# Patient Record
Sex: Male | Born: 1965 | Race: Black or African American | Hispanic: No | Marital: Single | Smoking: Former smoker
Health system: Southern US, Community
[De-identification: ages and names within clinical notes are randomized; demographics above are authoritative.]

## PROBLEM LIST (undated history)

## (undated) DIAGNOSIS — I10 Essential (primary) hypertension: Secondary | ICD-10-CM

## (undated) DIAGNOSIS — Z9109 Other allergy status, other than to drugs and biological substances: Secondary | ICD-10-CM

## (undated) DIAGNOSIS — J40 Bronchitis, not specified as acute or chronic: Secondary | ICD-10-CM

---

## 2011-10-04 ENCOUNTER — Encounter (HOSPITAL_BASED_OUTPATIENT_CLINIC_OR_DEPARTMENT_OTHER): Payer: Self-pay

## 2011-10-04 ENCOUNTER — Emergency Department (HOSPITAL_BASED_OUTPATIENT_CLINIC_OR_DEPARTMENT_OTHER)
Admission: EM | Admit: 2011-10-04 | Discharge: 2011-10-04 | Disposition: A | Discharge status: Home / Self Care | Payer: Self-pay | Attending: Emergency Medicine | Admitting: Emergency Medicine

## 2011-10-04 DIAGNOSIS — R059 Cough, unspecified: Secondary | ICD-10-CM | POA: Insufficient documentation

## 2011-10-04 DIAGNOSIS — R093 Abnormal sputum: Secondary | ICD-10-CM | POA: Insufficient documentation

## 2011-10-04 DIAGNOSIS — J3489 Other specified disorders of nose and nasal sinuses: Secondary | ICD-10-CM | POA: Insufficient documentation

## 2011-10-04 DIAGNOSIS — R0602 Shortness of breath: Secondary | ICD-10-CM | POA: Insufficient documentation

## 2011-10-04 DIAGNOSIS — R05 Cough: Secondary | ICD-10-CM | POA: Insufficient documentation

## 2011-10-04 DIAGNOSIS — J40 Bronchitis, not specified as acute or chronic: Secondary | ICD-10-CM | POA: Insufficient documentation

## 2011-10-04 HISTORY — DX: Other allergy status, other than to drugs and biological substances: Z91.09

## 2011-10-04 HISTORY — DX: Bronchitis, not specified as acute or chronic: J40

## 2011-10-04 HISTORY — DX: Essential (primary) hypertension: I10

## 2011-10-04 MED ORDER — IPRATROPIUM BROMIDE 0.02 % IN SOLN
0.5000 mg | Freq: Once | RESPIRATORY_TRACT | Status: AC
  Administered 2011-10-04: 0.5 mg via RESPIRATORY_TRACT

## 2011-10-04 MED ORDER — ALBUTEROL SULFATE (5 MG/ML) 0.5% IN NEBU
5.0000 mg | INHALATION_SOLUTION | Freq: Once | RESPIRATORY_TRACT | Status: AC
  Administered 2011-10-04: 5 mg via RESPIRATORY_TRACT
  Filled 2011-10-04: qty 1

## 2011-10-04 MED ORDER — IPRATROPIUM BROMIDE 0.02 % IN SOLN
0.5000 mg | Freq: Once | RESPIRATORY_TRACT | Status: AC
  Administered 2011-10-04: 0.5 mg via RESPIRATORY_TRACT
  Filled 2011-10-04: qty 2.5

## 2011-10-04 MED ORDER — PREDNISONE 50 MG PO TABS
60.0000 mg | ORAL_TABLET | Freq: Once | ORAL | Status: AC
  Administered 2011-10-04: 60 mg via ORAL
  Filled 2011-10-04: qty 1

## 2011-10-04 MED ORDER — ALBUTEROL SULFATE (5 MG/ML) 0.5% IN NEBU
INHALATION_SOLUTION | RESPIRATORY_TRACT | Status: AC
  Filled 2011-10-04: qty 1

## 2011-10-04 MED ORDER — ALBUTEROL SULFATE HFA 108 (90 BASE) MCG/ACT IN AERS
1.0000 | INHALATION_SPRAY | Freq: Four times a day (QID) | RESPIRATORY_TRACT | Status: DC
  Administered 2011-10-04: 1 via RESPIRATORY_TRACT
  Filled 2011-10-04: qty 6.7

## 2011-10-04 MED ORDER — IPRATROPIUM BROMIDE 0.02 % IN SOLN
RESPIRATORY_TRACT | Status: AC
  Filled 2011-10-04: qty 2.5

## 2011-10-04 MED ORDER — ALBUTEROL SULFATE (5 MG/ML) 0.5% IN NEBU
5.0000 mg | INHALATION_SOLUTION | Freq: Once | RESPIRATORY_TRACT | Status: AC
  Administered 2011-10-04: 5 mg via RESPIRATORY_TRACT

## 2011-10-04 MED ORDER — HYDROCOD POLST-CHLORPHEN POLST 10-8 MG/5ML PO LQCR: 5.0000 mL | Freq: Every evening | ORAL | Status: DC | PRN

## 2011-10-04 MED ORDER — PREDNISONE 10 MG PO TABS: 10.0000 mg | ORAL_TABLET | Freq: Two times a day (BID) | ORAL | Status: DC

## 2011-10-04 NOTE — ED Notes (Signed)
Pt states that for the past 3-4 days he has had a cough with chest congestion, denies head congestion, c/o seasonal allergies.

## 2011-10-04 NOTE — Discharge Instructions (Signed)
Use albuterol inhaler, 2 puffs every 4 hours, as needed for cough and shortness of breath.  Take prednisone as prescribed.   Take tussionex at night for cough if you are unable to sleep.  Do not drive within four hours of taking this medication (may cause drowsiness or confusion).  Call [facility] [phone]) if you do not have a primary care doctor and would like assistance with finding one.   You should return to the ER if you develop fever (temp >100.5), chest pain or worsening shortness of breath.  Bronchitis Bronchitis is the body's way of reacting to injury and/or infection (inflammation) of the bronchi. Bronchi are the air tubes that extend from the windpipe into the lungs. If the inflammation becomes severe, it may cause shortness of breath. CAUSES  Inflammation may be caused by:  A virus.   Germs (bacteria).   Dust.   Allergens.   Pollutants and many other irritants.  The cells lining the bronchial tree are covered with tiny hairs (cilia). These constantly beat upward, away from the lungs, toward the mouth. This keeps the lungs free of pollutants. When these cells become too irritated and are unable to do their job, mucus begins to develop. This causes the characteristic cough of bronchitis. The cough clears the lungs when the cilia are unable to do their job. Without either of these protective mechanisms, the mucus would settle in the lungs. Then you would develop pneumonia. Smoking is a common cause of bronchitis and can contribute to pneumonia. Stopping this habit is the single most important thing you can do to help yourself. TREATMENT   Your caregiver may prescribe an antibiotic if the cough is caused by bacteria. Also, medicines that open up your airways make it easier to breathe. Your caregiver may also recommend or prescribe an expectorant. It will loosen the mucus to be coughed up. Only take over-the-counter or prescription medicines for pain, discomfort, or fever as directed  by your caregiver.   Removing whatever causes the problem (smoking, for example) is critical to preventing the problem from getting worse.   Cough suppressants may be prescribed for relief of cough symptoms.   Inhaled medicines may be prescribed to help with symptoms now and to help prevent problems from returning.   For those with recurrent (chronic) bronchitis, there may be a need for steroid medicines.  SEEK IMMEDIATE MEDICAL CARE IF:   During treatment, you develop more pus-like mucus (purulent sputum).   You have a fever.   Your baby is older than 3 months with a rectal temperature of 102 F (38.9 C) or higher.   Your baby is 21 months old or younger with a rectal temperature of 100.4 F (38 C) or higher.   You become progressively more ill.   You have increased difficulty breathing, wheezing, or shortness of breath.  It is necessary to seek immediate medical care if you are elderly or sick from any other disease. MAKE SURE YOU:   Understand these instructions.   Will watch your condition.   Will get help right away if you are not doing well or get worse.  Document Released: 05/18/2005 Document Revised: 05/07/2011 Document Reviewed: 03/27/2008 [facility] Patient Information 2012 [place], [place].

## 2011-10-04 NOTE — ED Provider Notes (Signed)
History     CSN: [id number]  Arrival date & time 10/04/11  1220   First MD Initiated Contact with Patient 10/04/11 1239      Chief Complaint  Patient presents with  . Cough    (Consider location/radiation/quality/duration/timing/severity/associated sxs/prior treatment) HPI History provided by pt.   Pt presents w/ cough productive of white sputum x approx 3 days.  Associated w/ wheezing and mild, exertional SOB, as well as nasal congestion and rhinorrhea.  Denies fever, CP, N/V/D.  Wheezing and cough aggravated by laying flat.  No known sick contacts.  No h/o pulmonary disease but has seasonal allergies.  Quit smoking 3 years ago.    Past Medical History  Diagnosis Date  . Bronchitis   . Environmental allergies   . Hypertension     History reviewed. No pertinent past surgical history.  History reviewed. No pertinent family history.  History  Substance Use Topics  . Smoking status: Never Smoker   . Smokeless tobacco: Never Used  . Alcohol Use: No      Review of Systems  All other systems reviewed and are negative.    Allergies  Review of patient's allergies indicates no known allergies.  Home Medications   Current Outpatient Rx  Name Route Sig Dispense Refill  . ATENOLOL PO Oral Take by mouth daily.    Marland Kitchen UNKNOWN TO PATIENT  Blood pressure medication taken by mouth once daily, name starts with "L"      BP 157/94  Pulse 81  Temp(Src) 98.1 F (36.7 C) (Oral)  Resp 20  Ht 6' (1.829 m)  Wt 190 lb (86.183 kg)  BMI 25.77 kg/m2  SpO2 100%  Physical Exam  Nursing note and vitals reviewed. Constitutional: He is oriented to person, place, and time. He appears well-developed and well-nourished. No distress.  HENT:  Head: Normocephalic and atraumatic.       No sinus ttp.  Nasal congestion.   Eyes:       Normal appearance  Neck: Normal range of motion.  Cardiovascular: Normal rate and regular rhythm.   Pulmonary/Chest: Effort normal and breath sounds normal.  No respiratory distress.       Diffuse inspiratory wheezing and expiratory rhonchi; more prominent in upper lung fields.  Chest non-tender.   Musculoskeletal: Normal range of motion.       No peripheral edema or calf ttp  Lymphadenopathy:    He has no cervical adenopathy.  Neurological: He is alert and oriented to person, place, and time.  Skin: Skin is warm and dry. No rash noted.  Psychiatric: He has a normal mood and affect. His behavior is normal.    ED Course  Procedures (including critical care time)  Labs Reviewed - No data to display No results found.   1. Bronchitis       MDM  Healthy 46yo M presents w/ productive cough, wheezing and mild, exertional SOB.  Doubt pneumonia based on duration of sx and exam.  Likely has a viral bronchitis.  RT examined initially and reported diffuse insp/exp wheezing.  She treated w/ albuterol/atrovent neb, re-examined and reported that wheezing improved.  Pt is feeling a little better.  On my exam, diffuse inspiratory wheezing and expiratory rhonchi; no coughing.  Will try a second treatment + 60mg  prednisone and reassess shortly.  1:22 PM   Pt continues to feel better.  On re-examination, wheezing present but much improved.  Pt received an albuterol inhaler an d/c'd home w/ prednisone and short course of tussionex.  He understands that his cough may persist for 1-2 weeks and that abx are unnecessary at this time.  Return precautions discussed. 2:01 PM       Otilio Miu, PA 10/04/11 1402  Otilio Miu, PA 10/05/11 1436

## 2011-10-05 NOTE — ED Provider Notes (Signed)
Medical screening examination/treatment/procedure(s) were performed by non-physician practitioner and as supervising physician I was immediately available for consultation/collaboration.   Gwyneth Sprout, MD 10/05/11 2134

## 2012-02-29 ENCOUNTER — Telehealth: Payer: Self-pay | Admitting: Physician Assistant

## 2012-02-29 ENCOUNTER — Inpatient Hospital Stay (HOSPITAL_BASED_OUTPATIENT_CLINIC_OR_DEPARTMENT_OTHER)
Admission: EM | Admit: 2012-02-29 | Discharge: 2012-03-02 | DRG: 203 | Disposition: A | Discharge status: Home / Self Care | Payer: Self-pay | Attending: Internal Medicine | Admitting: Internal Medicine

## 2012-02-29 ENCOUNTER — Emergency Department (HOSPITAL_BASED_OUTPATIENT_CLINIC_OR_DEPARTMENT_OTHER): Payer: Self-pay

## 2012-02-29 ENCOUNTER — Encounter (HOSPITAL_BASED_OUTPATIENT_CLINIC_OR_DEPARTMENT_OTHER): Payer: Self-pay | Admitting: Family Medicine

## 2012-02-29 DIAGNOSIS — J309 Allergic rhinitis, unspecified: Secondary | ICD-10-CM | POA: Diagnosis present

## 2012-02-29 DIAGNOSIS — R0789 Other chest pain: Secondary | ICD-10-CM | POA: Diagnosis present

## 2012-02-29 DIAGNOSIS — R0600 Dyspnea, unspecified: Secondary | ICD-10-CM | POA: Diagnosis present

## 2012-02-29 DIAGNOSIS — J209 Acute bronchitis, unspecified: Secondary | ICD-10-CM | POA: Diagnosis present

## 2012-02-29 DIAGNOSIS — Z87891 Personal history of nicotine dependence: Secondary | ICD-10-CM

## 2012-02-29 DIAGNOSIS — R062 Wheezing: Secondary | ICD-10-CM

## 2012-02-29 DIAGNOSIS — I10 Essential (primary) hypertension: Secondary | ICD-10-CM | POA: Diagnosis present

## 2012-02-29 DIAGNOSIS — R0602 Shortness of breath: Secondary | ICD-10-CM

## 2012-02-29 DIAGNOSIS — I160 Hypertensive urgency: Secondary | ICD-10-CM

## 2012-02-29 DIAGNOSIS — N183 Chronic kidney disease, stage 3 unspecified: Secondary | ICD-10-CM | POA: Diagnosis present

## 2012-02-29 DIAGNOSIS — J45901 Unspecified asthma with (acute) exacerbation: Principal | ICD-10-CM | POA: Diagnosis present

## 2012-02-29 LAB — CBC
MCH: 30.6 pg (ref 26.0–34.0)
MCHC: 35.3 g/dL (ref 30.0–36.0)
MCV: 86.7 fL (ref 78.0–100.0)
Platelets: 267 10*3/uL (ref 150–400)
RBC: 5.03 MIL/uL (ref 4.22–5.81)
RDW: 12.6 % (ref 11.5–15.5)

## 2012-02-29 LAB — BASIC METABOLIC PANEL
CO2: 25 mEq/L (ref 19–32)
Calcium: 9.6 mg/dL (ref 8.4–10.5)
Creatinine, Ser: 1.5 mg/dL — ABNORMAL HIGH (ref 0.50–1.35)
GFR calc Af Amer: 64 mL/min — ABNORMAL LOW (ref >90)
GFR calc non Af Amer: 55 mL/min — ABNORMAL LOW (ref >90)
Sodium: 136 mEq/L (ref 135–145)

## 2012-02-29 LAB — CBC WITH DIFFERENTIAL/PLATELET
Basophils Absolute: 0 10*3/uL (ref 0.0–0.1)
Basophils Relative: 1 % (ref 0–1)
Eosinophils Absolute: 0.5 10*3/uL (ref 0.0–0.7)
Eosinophils Relative: 9 % — ABNORMAL HIGH (ref 0–5)
Lymphocytes Relative: 48 % — ABNORMAL HIGH (ref 12–46)
MCHC: 35.7 g/dL (ref 30.0–36.0)
MCV: 85.1 fL (ref 78.0–100.0)
Monocytes Absolute: 0.5 10*3/uL (ref 0.1–1.0)
Platelets: 246 10*3/uL (ref 150–400)
RDW: 12.1 % (ref 11.5–15.5)
WBC: 5.4 10*3/uL (ref 4.0–10.5)

## 2012-02-29 LAB — MRSA PCR SCREENING: MRSA by PCR: NEGATIVE

## 2012-02-29 LAB — CREATININE, SERUM: Creatinine, Ser: 1.47 mg/dL — ABNORMAL HIGH (ref 0.50–1.35)

## 2012-02-29 MED ORDER — IPRATROPIUM BROMIDE 0.02 % IN SOLN: 0.5000 mg | RESPIRATORY_TRACT | Status: DC | PRN

## 2012-02-29 MED ORDER — MORPHINE SULFATE 2 MG/ML IJ SOLN: 2.0000 mg | INTRAMUSCULAR | Status: DC | PRN

## 2012-02-29 MED ORDER — HEPARIN SODIUM (PORCINE) 5000 UNIT/ML IJ SOLN
5000.0000 [IU] | Freq: Three times a day (TID) | INTRAMUSCULAR | Status: DC
  Administered 2012-02-29 – 2012-03-02 (×5): 5000 [IU] via SUBCUTANEOUS
  Filled 2012-02-29 (×8): qty 1

## 2012-02-29 MED ORDER — ONDANSETRON HCL 4 MG/2ML IJ SOLN: 4.0000 mg | Freq: Three times a day (TID) | INTRAMUSCULAR | Status: DC | PRN

## 2012-02-29 MED ORDER — IPRATROPIUM BROMIDE 0.02 % IN SOLN
0.5000 mg | RESPIRATORY_TRACT | Status: DC
  Administered 2012-02-29: 0.5 mg via RESPIRATORY_TRACT
  Filled 2012-02-29: qty 2.5

## 2012-02-29 MED ORDER — MULTI-VITAMIN/MINERALS PO TABS: 1.0000 | ORAL_TABLET | Freq: Every day | ORAL | Status: DC

## 2012-02-29 MED ORDER — ALBUTEROL SULFATE (5 MG/ML) 0.5% IN NEBU
2.5000 mg | INHALATION_SOLUTION | Freq: Once | RESPIRATORY_TRACT | Status: AC
  Administered 2012-02-29: 2.5 mg via RESPIRATORY_TRACT
  Filled 2012-02-29: qty 0.5

## 2012-02-29 MED ORDER — OMEGA-3-ACID ETHYL ESTERS 1 G PO CAPS
1.0000 g | ORAL_CAPSULE | Freq: Every day | ORAL | Status: DC
  Administered 2012-02-29 – 2012-03-02 (×3): 1 g via ORAL
  Filled 2012-02-29 (×3): qty 1

## 2012-02-29 MED ORDER — ADULT MULTIVITAMIN W/MINERALS CH
1.0000 | ORAL_TABLET | Freq: Every day | ORAL | Status: DC
  Administered 2012-02-29 – 2012-03-02 (×3): 1 via ORAL
  Filled 2012-02-29 (×3): qty 1

## 2012-02-29 MED ORDER — PREDNISONE 10 MG PO TABS
ORAL_TABLET | ORAL | Status: AC
  Filled 2012-02-29: qty 1

## 2012-02-29 MED ORDER — ALBUTEROL SULFATE (5 MG/ML) 0.5% IN NEBU
2.5000 mg | INHALATION_SOLUTION | RESPIRATORY_TRACT | Status: DC
  Administered 2012-02-29: 2.5 mg via RESPIRATORY_TRACT
  Filled 2012-02-29: qty 0.5

## 2012-02-29 MED ORDER — SODIUM CHLORIDE 0.9 % IJ SOLN
3.0000 mL | Freq: Two times a day (BID) | INTRAMUSCULAR | Status: DC
  Administered 2012-02-29 – 2012-03-02 (×4): 3 mL via INTRAVENOUS

## 2012-02-29 MED ORDER — METHYLPREDNISOLONE SODIUM SUCC 125 MG IJ SOLR
60.0000 mg | Freq: Four times a day (QID) | INTRAMUSCULAR | Status: AC
Start: 2012-02-29 — End: 2012-03-01
  Administered 2012-02-29: 18:00:00 via INTRAVENOUS
  Administered 2012-03-01 (×4): 60 mg via INTRAVENOUS
  Filled 2012-02-29 (×7): qty 0.96

## 2012-02-29 MED ORDER — SODIUM CHLORIDE 0.9 % IJ SOLN: 3.0000 mL | INTRAMUSCULAR | Status: DC | PRN

## 2012-02-29 MED ORDER — LISINOPRIL 20 MG PO TABS
20.0000 mg | ORAL_TABLET | Freq: Every day | ORAL | Status: DC
  Administered 2012-02-29 – 2012-03-02 (×3): 20 mg via ORAL
  Filled 2012-02-29 (×3): qty 1

## 2012-02-29 MED ORDER — SODIUM CHLORIDE 0.9 % IV SOLN: INTRAVENOUS | Status: DC

## 2012-02-29 MED ORDER — LABETALOL HCL 5 MG/ML IV SOLN: 20.0000 mg | INTRAVENOUS | Status: DC | PRN

## 2012-02-29 MED ORDER — IPRATROPIUM BROMIDE 0.02 % IN SOLN
0.5000 mg | Freq: Once | RESPIRATORY_TRACT | Status: AC
  Administered 2012-02-29: 0.5 mg via RESPIRATORY_TRACT
  Filled 2012-02-29: qty 2.5

## 2012-02-29 MED ORDER — AMLODIPINE BESYLATE 10 MG PO TABS
10.0000 mg | ORAL_TABLET | Freq: Every day | ORAL | Status: DC
  Administered 2012-02-29 – 2012-03-02 (×3): 10 mg via ORAL
  Filled 2012-02-29 (×3): qty 1

## 2012-02-29 MED ORDER — OMEGA-3 FATTY ACIDS 1000 MG PO CAPS: 1.0000 g | ORAL_CAPSULE | Freq: Every day | ORAL | Status: DC

## 2012-02-29 MED ORDER — ALBUTEROL SULFATE (5 MG/ML) 0.5% IN NEBU: 2.5000 mg | INHALATION_SOLUTION | RESPIRATORY_TRACT | Status: DC | PRN

## 2012-02-29 MED ORDER — MOXIFLOXACIN HCL 400 MG PO TABS
400.0000 mg | ORAL_TABLET | Freq: Every day | ORAL | Status: DC
  Administered 2012-02-29 – 2012-03-01 (×2): 400 mg via ORAL
  Filled 2012-02-29 (×3): qty 1

## 2012-02-29 MED ORDER — ACETAMINOPHEN 650 MG RE SUPP: 650.0000 mg | Freq: Four times a day (QID) | RECTAL | Status: DC | PRN

## 2012-02-29 MED ORDER — SODIUM CHLORIDE 0.9 % IV SOLN: 250.0000 mL | INTRAVENOUS | Status: DC | PRN

## 2012-02-29 MED ORDER — PREDNISONE 50 MG PO TABS
ORAL_TABLET | ORAL | Status: AC
  Filled 2012-02-29: qty 1

## 2012-02-29 MED ORDER — LABETALOL HCL 5 MG/ML IV SOLN
10.0000 mg | Freq: Once | INTRAVENOUS | Status: AC
  Administered 2012-02-29: 10 mg via INTRAVENOUS
  Filled 2012-02-29: qty 4

## 2012-02-29 MED ORDER — OXYCODONE HCL 5 MG PO TABS: 5.0000 mg | ORAL_TABLET | ORAL | Status: DC | PRN

## 2012-02-29 MED ORDER — HYDRALAZINE HCL 20 MG/ML IJ SOLN
10.0000 mg | Freq: Once | INTRAMUSCULAR | Status: AC
  Administered 2012-02-29: 10 mg via INTRAVENOUS
  Filled 2012-02-29: qty 1

## 2012-02-29 MED ORDER — ACETAMINOPHEN 325 MG PO TABS: 650.0000 mg | ORAL_TABLET | Freq: Four times a day (QID) | ORAL | Status: DC | PRN

## 2012-02-29 MED ORDER — PREDNISONE 50 MG PO TABS
60.0000 mg | ORAL_TABLET | Freq: Every day | ORAL | Status: DC
  Administered 2012-02-29: 60 mg via ORAL
  Filled 2012-02-29: qty 1

## 2012-02-29 NOTE — Telephone Encounter (Signed)
Telephone Call with Dr. Alto Howard at [facility]  Disposition Note  Dennis Howard, is a 46 y.o. male,   MRN: [id number]  -  DOB - 07-22-1965  Outpatient Primary MD for the patient is No primary provider on file.   46 yo male presents with shortness of breath x 3 - 4 weeks.  He has a history of reactive airway disease.  BP is found to be significantly elevated 188/127.  Labs were not remarkable with the exception of a creatinine of 1.5.  Patient was treated with nebulizers and SOB improved.  He does not complain of chest pain.  He intermittently takes his father's BP medicine (Atenolol).  He saw Dr Dennis Howard once several years ago but does not plan to return to see him again.    EDP is giving patient a dose of Labetalol to attempt to bring BP down.  He will likely need BP med infusion.  Step down bed has been requested for hypertensive urgency.  Hopefully when he arrives from [facility] this will be able to be down graded to a tele bed.   Dennis Downs, PA-C [facility] Pager: [phone]

## 2012-02-29 NOTE — ED Provider Notes (Signed)
History     CSN: [id number]  Arrival date & time 02/29/12  1006   First MD Initiated Contact with Patient 02/29/12 1055      Chief Complaint  Patient presents with  . Shortness of Breath    (Consider location/radiation/quality/duration/timing/severity/associated sxs/prior treatment) HPI Patient is a 46 year old male with history of hypertension who presents today complaining of 3-4 weeks of shortness of breath. He feels that this has been slightly progressive. Patient reports dyspnea on exertion. He denies any leg swelling or chest pain. He endorses cough but denies any fevers or sick contacts. He has no history of diabetes, hypercholesterolemia, or early CAD in his family. Presenting blood pressures are in the 190s over 130s. Patient is otherwise hemodynamically stable. He denies any numbness, tingling, or paresthesias. Patient denies headache or lightheadedness. He is on any history of kidney disease to his knowledge. He initially reports that he has not been taking his atenolol as prescribed. On further discussion it appears that the patient had seen Dr. Roselee Nova he at his father's recommendations several years ago. Since that time patient has been intermittently taking his father's atenolol. There are no other associated or modifying factors. Past Medical History  Diagnosis Date  . Bronchitis   . Environmental allergies   . Hypertension     History reviewed. No pertinent past surgical history.  History reviewed. No pertinent family history.  History  Substance Use Topics  . Smoking status: Never Smoker   . Smokeless tobacco: Never Used  . Alcohol Use: Yes     occ      Review of Systems  Constitutional: Positive for fatigue.  HENT: Negative.   Eyes: Negative.   Respiratory: Positive for cough and shortness of breath.   Cardiovascular: Negative.   Gastrointestinal: Negative.   Genitourinary: Negative.   Musculoskeletal: Negative.   Skin: Negative.   Neurological: Negative.    Hematological: Negative.   Psychiatric/Behavioral: Negative.   All other systems reviewed and are negative.    Allergies  Review of patient's allergies indicates no known allergies.  Home Medications  No current outpatient prescriptions on file.  BP 181/118  Pulse 86  Temp 97.9 F (36.6 C) (Oral)  Resp 20  Ht 6' (1.829 m)  Wt 195 lb (88.451 kg)  BMI 26.45 kg/m2  SpO2 97%  Physical Exam  Nursing note and vitals reviewed. GEN: Well-developed, well-nourished male in no distress HEENT: Atraumatic, normocephalic. Oropharynx clear without erythema EYES: PERRLA BL, no scleral icterus. NECK: Trachea midline, no meningismus CV: regular rate and rhythm. No murmurs, rubs, or gallops PULM: Diminished throughout. Occasional expiratory wheeze. No crackles or rales appreciated.  GI: soft, non-tender. No guarding, rebound, or tenderness. + bowel sounds  GU: deferred Neuro: cranial nerves grossly 2-12 intact, no abnormalities of strength or sensation, A and O x 3 MSK: Patient moves all 4 extremities symmetrically, no deformity, edema, or injury noted Skin: No rashes petechiae, purpura, or jaundice Psych: no abnormality of mood   ED Course  Procedures (including critical care time)  Indication: HTN Please note this EKG was reviewed extemporaneously by myself.   Date: 02/29/2012  Rate: 87  Rhythm: normal sinus rhythm  QRS Axis: normal  Intervals: normal  ST/T Wave abnormalities: normal  Conduction Disutrbances:none  Narrative Interpretation:   Old EKG Reviewed: none available      Labs Reviewed  CBC WITH DIFFERENTIAL - Abnormal; Notable for the following:    Neutrophils Relative 33 (*)     Lymphocytes Relative 48 (*)  Eosinophils Relative 9 (*)     All other components within normal limits  BASIC METABOLIC PANEL - Abnormal; Notable for the following:    Creatinine, Ser 1.50 (*)     GFR calc non Af Amer 55 (*)     GFR calc Af Amer 64 (*)     All other components  within normal limits  MRSA PCR SCREENING   Dg Chest 2 View  02/29/2012  *RADIOLOGY REPORT*  Clinical Data: 46 year old male with chest pain and tightness shortness of breath.  CHEST - 2 VIEW  Comparison: None.  Findings: Normal lung volumes.  Cardiac size and mediastinal contours are within normal limits.  Visualized tracheal air column is within normal limits.  No pneumothorax, pulmonary edema, pleural effusion or confluent pulmonary opacity. No acute osseous abnormality identified.  IMPRESSION: No acute cardiopulmonary abnormality.   Original Report Authenticated By: Harley Hallmark, M.D.      1. Hypertensive urgency   2. Shortness of breath   3. Wheezing       MDM  Patient was evaluated by myself. Based on evaluation patient did have some wheezing. He was treated with prednisone as well as albuterol and Atrovent. Patient had very elevated blood pressure. He denied any chest pain or neurologic symptoms. Patient reported missing a few days a blood pressure medication but in fact had been sharing a prescription with his father and had not been taking medication for some time. Patient was given a dose of labetalol 10 mg IV. He did not have significant improvement in his blood pressure with this. Patient did have creatinine of 1.5 today with normal potassium. I did not have any labs for comparison. Though this could represent acute renal insufficiency patient does not have any hyperkalemia accompanying this. Patient was given a dose of hydralazine 10 mg IV. He was transferred to [facility] for mesh in a step down unit for management of hypertensive urgency as well as his shortness of breath. Patient absolutely no neurologic symptoms or chest pain to suggest this being an episode of hypertensive emergency with either ACS or neurovascular compromise.        Cyndra Numbers, MD 02/29/12 [phone]

## 2012-02-29 NOTE — ED Notes (Signed)
Pt states he and his father have been sharing his fathers BP medication.

## 2012-02-29 NOTE — H&P (Signed)
PCP:   No primary provider on file.   Chief Complaint:  Shortness of breath/wheeze. Marland Kitchen  HPI: This is a 46 year old male, with known history of HTN and allergic rhinitis, who presented to [facility] with progressive shortness of breath, chest tightness and wheeze. According to patient, he has a history of allergic rhinitis, which is perennial, but lately, has improved, which he insists is due to taking fish oil on  Regular basis. His main problem otherwise, was HTN, which was diagnosed about 8 yeers ago. He followed up with Dr Rinaldo Cloud between 2005-2008, and was treated with Atenolol and Lisinopril. Since 2008, when he was out of a job, he was unable to obtain antihypertensives, and has resorted to sharing his father's Atenolol and Lisinopril, albeit, not very regularly. Im May 2013, he had an episode of chest tightness and wheeze, for which he was treated at the [facility] with nebulizers, and then discharged on Albuterol inhaler and a 7-day course of Prednisone, with resolution. Since then he has been asymptomatic until end of August to beginning of September this year, when he once again, stated having episodes of chest tightness, wheeze and dyspnea, which have become progressive, for a mionth. In the last one week, symptoms have worsened and he has a had a dry cough, which became productive of clear phlegm from 02/26/12. He denies associated fever, chills or chest pain. In the last 2 days, he has been unable to slesp well because of his symptoms, and was awake all night on 02/28/12, due to wheeze. In AM of 02/29/12, he went to [facility], was given 2 nebulizer treatments with amelioration of symptoms, and then transferred to [facility] SDU. Marland Kitchen   Allergies:  No Known Allergies    Past Medical History  Diagnosis Date  . Bronchitis   . Environmental allergies   . Hypertension     History reviewed. No pertinent past surgical history.  Prior to Admission medications   Medication Sig Start  Date End Date Taking? Authorizing Provider  fish oil-omega-3 fatty acids 1000 MG capsule Take by mouth daily.   Yes Historical Provider, MD  ibuprofen (ADVIL,MOTRIN) 200 MG tablet Take 800 mg by mouth every 6 (six) hours as needed. Pain from breathing issues   Yes Historical Provider, MD  Multiple Vitamins-Minerals (MULTIVITAMIN WITH MINERALS) tablet Take 1 tablet by mouth daily.   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 Ex-smoker, smoked 2 packs of cigarettes per week for 11 years, and quit in 2011. He has never used smokeless tobacco. He reports that he drinks alcohol occasionally. He reports that he does not use illicit drugs.  Family History:  Father is aged 11 years, with HTN. Mother is aged 68 years, with HTN.    Review of Systems:  As per HPI and chief complaint. Patent denies fatigue, diminished appetite, weight loss, fever, chills, headache, blurred vision, difficulty in speaking, dysphagia, chest pain, orthopnea, paroxysmal nocturnal dyspnea, nausea, diaphoresis, abdominal pain, vomiting, diarrhea, belching, heartburn, hematemesis, melena, dysuria, nocturia, urinary frequency, hematochezia, lower extremity swelling, pain, or redness. The rest of the systems review is negative.  Physical Exam:  General:  Patient does not appear to be in obvious acute distress at the time of this evaluation. Alert, communicative, fully oriented, talking in complete sentences, not short of breath at rest. Hydration status appears fair.  HEENT:  No clinical pallor, no jaundice, no conjunctival injection or discharge. Throat is clear.  NECK:  Supple, JVP not seen, no carotid bruits, no palpable lymphadenopathy, no palpable goiter. CHEST:  Bilateral polyphonic expiratory wheeze, no crackles.  HEART:  Sounds 1 and 2 heard, normal, regular, no murmurs. ABDOMEN:  Flat, soft, non-tender, no palpable organomegaly, no palpable masses, normal bowel  sounds. GENITALIA:  Not examined. LOWER EXTREMITIES:  No pitting edema, palpable peripheral pulses. . MUSCULOSKELETAL SYSTEM:  Unremarkable. CENTRAL NERVOUS SYSTEM:  No focal neurologic deficit on gross examination.  Labs on Admission:  Results for orders placed during the hospital encounter of 02/29/12 (from the past 48 hour(s))  CBC WITH DIFFERENTIAL     Status: Abnormal   Collection Time   02/29/12 11:20 AM      Component Value Range Comment   WBC 5.4  4.0 - 10.5 K/uL    RBC 5.04  4.22 - 5.81 MIL/uL    Hemoglobin 15.3  13.0 - 17.0 g/dL    HCT 16.1  09.6 - 04.5 %    MCV 85.1  78.0 - 100.0 fL    MCH 30.4  26.0 - 34.0 pg    MCHC 35.7  30.0 - 36.0 g/dL    RDW 40.9  81.1 - 91.4 %    Platelets 246  150 - 400 K/uL    Neutrophils Relative 33 (*) 43 - 77 %    Neutro Abs 1.8  1.7 - 7.7 K/uL    Lymphocytes Relative 48 (*) 12 - 46 %    Lymphs Abs 2.6  0.7 - 4.0 K/uL    Monocytes Relative 9  3 - 12 %    Monocytes Absolute 0.5  0.1 - 1.0 K/uL    Eosinophils Relative 9 (*) 0 - 5 %    Eosinophils Absolute 0.5  0.0 - 0.7 K/uL    Basophils Relative 1  0 - 1 %    Basophils Absolute 0.0  0.0 - 0.1 K/uL   BASIC METABOLIC PANEL     Status: Abnormal   Collection Time   02/29/12 11:20 AM      Component Value Range Comment   Sodium 136  135 - 145 mEq/L    Potassium 3.9  3.5 - 5.1 mEq/L    Chloride 99  96 - 112 mEq/L    CO2 25  19 - 32 mEq/L    Glucose, Bld 97  70 - 99 mg/dL    BUN 17  6 - 23 mg/dL    Creatinine, Ser 7.82 (*) 0.50 - 1.35 mg/dL    Calcium 9.6  8.4 - 95.6 mg/dL    GFR calc non Af Amer 55 (*) >90 mL/min    GFR calc Af Amer 64 (*) >90 mL/min     Radiological Exams on Admission: *RADIOLOGY REPORT*  Clinical Data: 46 year old male with chest pain and tightness  shortness of breath.  CHEST - 2 VIEW  Comparison: None.  Findings: Normal lung volumes. Cardiac size and mediastinal  contours are within normal limits. Visualized tracheal air column  is within normal limits. No  pneumothorax, pulmonary edema, pleural  effusion or confluent pulmonary opacity. No acute osseous  abnormality identified.  IMPRESSION:  No acute cardiopulmonary abnormality.  Original Report Authenticated By: Harley Hallmark, M.D.   Assessment/Plan Active Problems: 1. Dyspnea/Asthma exacerbation: Patient presented with progressive shortness of breath and wheeze, intermittently progressive over the last one month, preceded by a similar episode in May 2013. Now worse in the last one week. He is also known to have allergic rhinitis history. CXR is devoid  of acute pathology, and response to bronchodilator nebulizer, was dramatic. Chest auscultation revealed bilateral polyphonic expiratory wheeze. Clinically, this picture is consistent with exacerbation of bronchial asthma. We shall manage with bronchodilator nebulizers, Oxygen supplementation, Avelox and steroids. As an Herbalist, patient does travel a bit by road, so will check D-Dimer for completeness, and follow up with CTA, if positive.  2. HTN (hypertension), malignant: BP is markedly elevated at 179/132-181/118, at presentation. This is likely due to lack of regular antihypertensive medication. Fortunately, he has no chest pain, mental status changes or acte ischemic EKG changes. Will manage with iv Labetalol prn, as well a scheduled ACE-I and calcium channel antagonist. Further escalation may be needed. Have ordered 2D echocardiogram. 3. Allergic rhinitis: Not problematic at this time.   Further management will depend on clinical course.   Comment: Patient is FULL CODE.    Time Spent on Admission: 45 mins.   Chevon Laufer,CHRISTOPHER 02/29/2012, 4:06 PM

## 2012-02-29 NOTE — ED Notes (Signed)
Pt. Is SR on monitor and in no resp. Distress.    Lungs snds are wheezes bilat.

## 2012-02-29 NOTE — Care Management Note (Addendum)
    Page 1 of 1   03/02/2012     11:08:56 AM   CARE MANAGEMENT NOTE 03/02/2012  Patient:  Dennis Howard, Dennis Howard   Account Number:  [id number]  Date Initiated:  02/29/2012  Documentation initiated by:  Junius Creamer  Subjective/Objective Assessment:   adm w htn     Action/Plan:   lives alone   Anticipated DC Date:     Anticipated DC Plan:        DC Planning Services  CM consult  Medication Assistance      Choice offered to / List presented to:             Status of service:  Completed, signed off Medicare Important Message given?   (If response is "NO", the following Medicare IM given date fields will be blank) Date Medicare IM given:   Date Additional Medicare IM given:    Discharge Disposition:  HOME/SELF CARE  Per UR Regulation:  Reviewed for med. necessity/level of care/duration of stay  If discussed at Long Length of Stay Meetings, dates discussed:    Comments:  03-02-12 1101CM did speak to pt and he stated that he was taking some of his fathers medications. CM did make pt aware of [facility] med list. CM did go over the cost of meds with pt and the most expensive will be the albuterol inh and the norvasc. MD will write for albuterol inhaler and provide pt with 3 refillson meds due to insurance starts for the pt in Dec. Pt had a list of PCP's and CM added the [facility] on the list due to new pt's will be accepted in [place]. No further needs from CM at this time.    9/30 15:34 DEBBIE DOWELL RN,BSN [phone] spoke w pt. gave him community list of poss pcp's. pt will get ins thru work in dec. spoke w md and he may do refills until pt gets ins. gave pt to pt assist prescript copays cards.

## 2012-02-29 NOTE — ED Notes (Signed)
Pt c/o shortness of breath x 4 wks and sts he had bronchitis a few months ago. Pt sts he has not been taking BP meds.

## 2012-03-01 LAB — COMPREHENSIVE METABOLIC PANEL
AST: 16 U/L (ref 0–37)
Albumin: 3.7 g/dL (ref 3.5–5.2)
Alkaline Phosphatase: 70 U/L (ref 39–117)
Chloride: 102 mEq/L (ref 96–112)
Potassium: 4.2 mEq/L (ref 3.5–5.1)
Sodium: 138 mEq/L (ref 135–145)
Total Bilirubin: 0.3 mg/dL (ref 0.3–1.2)

## 2012-03-01 LAB — CBC
HCT: 42.7 % (ref 39.0–52.0)
MCH: 30.1 pg (ref 26.0–34.0)
MCV: 86.3 fL (ref 78.0–100.0)
Platelets: 262 10*3/uL (ref 150–400)
RDW: 12.6 % (ref 11.5–15.5)

## 2012-03-01 MED ORDER — ALBUTEROL SULFATE (5 MG/ML) 0.5% IN NEBU: 2.5000 mg | INHALATION_SOLUTION | RESPIRATORY_TRACT | Status: DC | PRN

## 2012-03-01 MED ORDER — IPRATROPIUM BROMIDE 0.02 % IN SOLN: 0.5000 mg | RESPIRATORY_TRACT | Status: DC | PRN

## 2012-03-01 MED ORDER — PREDNISONE 50 MG PO TABS
60.0000 mg | ORAL_TABLET | Freq: Every day | ORAL | Status: DC
  Administered 2012-03-02: 60 mg via ORAL
  Filled 2012-03-01 (×2): qty 1

## 2012-03-01 MED ORDER — ALBUTEROL SULFATE (5 MG/ML) 0.5% IN NEBU
2.5000 mg | INHALATION_SOLUTION | Freq: Four times a day (QID) | RESPIRATORY_TRACT | Status: DC
  Administered 2012-03-01 – 2012-03-02 (×4): 2.5 mg via RESPIRATORY_TRACT
  Filled 2012-03-01 (×4): qty 0.5

## 2012-03-01 MED ORDER — ALBUTEROL SULFATE (5 MG/ML) 0.5% IN NEBU: 2.5000 mg | INHALATION_SOLUTION | RESPIRATORY_TRACT | Status: DC

## 2012-03-01 MED ORDER — IPRATROPIUM BROMIDE 0.02 % IN SOLN
0.5000 mg | Freq: Four times a day (QID) | RESPIRATORY_TRACT | Status: DC
  Administered 2012-03-01 – 2012-03-02 (×4): 0.5 mg via RESPIRATORY_TRACT
  Filled 2012-03-01 (×4): qty 2.5

## 2012-03-01 NOTE — Progress Notes (Signed)
*  PRELIMINARY RESULTS* Echocardiogram 2D Echocardiogram has been performed.  Dennis Howard 03/01/2012, 11:30 AM

## 2012-03-01 NOTE — Progress Notes (Signed)
[facility] PROGRESS NOTE  Dennis Howard RUE:[id number] DOB: 07-31-1965 DOA: 02/29/2012 PCP: No primary provider on file.  Assessment/Plan: Active Problems:  Dyspnea  Asthma exacerbation  HTN (hypertension), malignant  Allergic rhinitis    1. Dyspnea/Asthma exacerbation: Patient presented with progressive shortness of breath and wheeze, intermittently progressive over the last one month, preceded by a similar episode in May 2013. Now worse in the last one week. He is also known to have allergic rhinitis history. CXR is devoid of acute pathology, and response to bronchodilator nebulizer in the ED, was dramatic. Chest auscultation revealed bilateral polyphonic expiratory wheeze. Clinically, this picture is consistent with exacerbation of bronchial asthma. Managing with bronchodilator nebulizers, Oxygen supplementation, Avelox and steroids. As an Herbalist, patient does travel a bit by road, so D-Dimer for completeness, and was negative at 0.30. Today, he feels much better, and wheezes are fewer. Will switch to oral steroid taper, from 03/02/12.  2. HTN (hypertension), malignant: BP is markedly elevated at 179/132-181/118, at presentation. This is likely due to lack of regular antihypertensive medication. Fortunately, he had no chest pain, mental status changes or acute ischemic EKG changes. Managed with iv Labetalol prn, as well a scheduled ACE-I and calcium channel antagonist. BP is controlled today. 2D echocardiogram is pending. Patient will need a script for 30 days of Norvasc and Lisinopril, with 3 refills apiece, on discharge, as his insurance will become active in December, 2013.  3. Allergic rhinitis: Not problematic at this time.    Code Status: Full Coder.  Family Communication:  Disposition Plan: Possible discharge on 03/01/12. Stable for transfer off unit today.    Brief narrative: This is a 46 year old male, with known history of HTN and allergic rhinitis, who  presented to [facility] with progressive shortness of breath, chest tightness and wheeze. According to patient, he has a history of allergic rhinitis, which is perennial, but lately, has improved, which he insists is due to taking fish oil on Regular basis. His main problem otherwise, was HTN, which was diagnosed about 8 yeers ago. He followed up with Dr Rinaldo Cloud between 2005-2008, and was treated with Atenolol and Lisinopril. Since 2008, when he was out of a job, he was unable to obtain antihypertensives, and has resorted to sharing his father's Atenolol and Lisinopril, albeit, not very regularly. Im May 2013, he had an episode of chest tightness and wheeze, for which he was treated at the [facility] with nebulizers, and then discharged on Albuterol inhaler and a 7-day course of Prednisone, with resolution. Since then he has been asymptomatic until end of August to beginning of September this year, when he once again, stated having episodes of chest tightness, wheeze and dyspnea, which have become progressive, for a mionth. In the last one week, symptoms have worsened and he has a had a dry cough, which became productive of clear phlegm from 02/26/12. He denies associated fever, chills or chest pain. In the last 2 days, he has been unable to slesp well because of his symptoms, and was awake all night on 02/28/12, due to wheeze. In AM of 02/29/12, he went to [facility], was given 2 nebulizer treatments with amelioration of symptoms, and then transferred to [facility] SDU.    Consultants:  N/A.   Procedures:  CXR.  Echocardiogram  Antibiotics:  Avelox 02/29/12>>>  HPI/Subjective: Feels better.   Objective: Vital signs in last 24 hours: Temp:  [97.3 F (36.3 C)-98.8 F (37.1 C)] 97.3 F (36.3  C) (10/01 0810) Pulse Rate:  [85-115] 98  (10/01 0324) Resp:  [16-21] 20  (10/01 0810) BP: (110-202)/(75-133) 136/86 mmHg (10/01 0810) SpO2:  [94 %-99 %] 97 % (10/01 0810) Weight:  [88.451 kg  (195 lb)] 88.451 kg (195 lb) (09/30 1031) Weight change:     Intake/Output from previous day: 09/30 0701 - 10/01 0700 In: 320 [P.O.:320] Out: 200 [Urine:200]     Physical Exam: General: Comfortable. Alert, communicative, fully oriented, talking in complete sentences, not short of breath at rest. Hydration status appears fair.  HEENT: No clinical pallor, no jaundice, no conjunctival injection or discharge. Throat is clear.  NECK: Supple, JVP not seen, no carotid bruits, no palpable lymphadenopathy, no palpable goiter.  CHEST: Few wheezes, no crackles.  HEART: Sounds 1 and 2 heard, normal, regular, no murmurs.  ABDOMEN: Flat, soft, non-tender, no palpable organomegaly, no palpable masses, normal bowel sounds.  GENITALIA: Not examined.  LOWER EXTREMITIES: No pitting edema, palpable peripheral pulses. .  MUSCULOSKELETAL SYSTEM: Unremarkable.  CENTRAL NERVOUS SYSTEM: No focal neurologic deficit on gross examination.  Lab Results:  Basename 03/01/12 0512 02/29/12 2133  WBC 8.3 6.7  HGB 14.9 15.4  HCT 42.7 43.6  PLT 262 267    Basename 03/01/12 0512 02/29/12 2133 02/29/12 1120  NA 138 -- 136  K 4.2 -- 3.9  CL 102 -- 99  CO2 22 -- 25  GLUCOSE 137* -- 97  BUN 25* -- 17  CREATININE 1.59* 1.47* --  CALCIUM 10.0 -- 9.6   Recent Results (from the past 240 hour(s))  MRSA PCR SCREENING     Status: Normal   Collection Time   02/29/12  3:52 PM      Component Value Range Status Comment   MRSA by PCR NEGATIVE  NEGATIVE Final      Studies/Results: Dg Chest 2 View  02/29/2012  *RADIOLOGY REPORT*  Clinical Data: 46 year old male with chest pain and tightness shortness of breath.  CHEST - 2 VIEW  Comparison: None.  Findings: Normal lung volumes.  Cardiac size and mediastinal contours are within normal limits.  Visualized tracheal air column is within normal limits.  No pneumothorax, pulmonary edema, pleural effusion or confluent pulmonary opacity. No acute osseous abnormality identified.   IMPRESSION: No acute cardiopulmonary abnormality.   Original Report Authenticated By: Harley Hallmark, M.D.     Medications: Scheduled Meds:   . albuterol  2.5 mg Nebulization Once  . albuterol  2.5 mg Nebulization Once  . amLODipine  10 mg Oral Daily  . heparin  5,000 Units Subcutaneous Q8H  . hydrALAZINE  10 mg Intravenous Once  . ipratropium  0.5 mg Nebulization Once  . labetalol  10 mg Intravenous Once  . lisinopril  20 mg Oral Daily  . methylPREDNISolone (SOLU-MEDROL) injection  60 mg Intravenous Q6H  . moxifloxacin  400 mg Oral Q2000  . multivitamin with minerals  1 tablet Oral Daily  . omega-3 acid ethyl esters  1 g Oral Daily  . sodium chloride  3 mL Intravenous Q12H  . DISCONTD: sodium chloride   Intravenous STAT  . DISCONTD: albuterol  2.5 mg Nebulization Q4H  . DISCONTD: albuterol  2.5 mg Nebulization Q4H  . DISCONTD: fish oil-omega-3 fatty acids  1 g Oral Daily  . DISCONTD: ipratropium  0.5 mg Nebulization Q4H  . DISCONTD: multivitamin with minerals  1 tablet Oral Daily  . DISCONTD: predniSONE      . DISCONTD: predniSONE      . DISCONTD: predniSONE  60  mg Oral Q breakfast   Continuous Infusions:  PRN Meds:.sodium chloride, acetaminophen, acetaminophen, albuterol, ipratropium, labetalol, morphine injection, oxyCODONE, sodium chloride, DISCONTD: albuterol, DISCONTD: ipratropium, DISCONTD: ipratropium, DISCONTD: ondansetron (ZOFRAN) IV    LOS: 1 day   Selvin Yun,CHRISTOPHER  [facility] Pager [phone]. If 8PM-8AM, please contact night-coverage at [email or web address], password [facility] 03/01/2012, 10:16 AM  LOS: 1 day

## 2012-03-02 DIAGNOSIS — J209 Acute bronchitis, unspecified: Secondary | ICD-10-CM | POA: Diagnosis present

## 2012-03-02 LAB — BASIC METABOLIC PANEL
BUN: 30 mg/dL — ABNORMAL HIGH (ref 6–23)
Calcium: 10 mg/dL (ref 8.4–10.5)
GFR calc Af Amer: 57 mL/min — ABNORMAL LOW (ref >90)
GFR calc non Af Amer: 49 mL/min — ABNORMAL LOW (ref >90)
Potassium: 4.2 mEq/L (ref 3.5–5.1)
Sodium: 138 mEq/L (ref 135–145)

## 2012-03-02 LAB — CBC
HCT: 41.8 % (ref 39.0–52.0)
MCH: 30.7 pg (ref 26.0–34.0)
MCHC: 34.9 g/dL (ref 30.0–36.0)
RDW: 13 % (ref 11.5–15.5)

## 2012-03-02 MED ORDER — ALBUTEROL SULFATE HFA 108 (90 BASE) MCG/ACT IN AERS: 2.0000 | INHALATION_SPRAY | RESPIRATORY_TRACT | Status: AC | PRN

## 2012-03-02 MED ORDER — AMLODIPINE BESYLATE 10 MG PO TABS: 10.0000 mg | ORAL_TABLET | Freq: Every day | ORAL | Status: AC

## 2012-03-02 MED ORDER — DOXYCYCLINE HYCLATE 100 MG PO TABS: 100.0000 mg | ORAL_TABLET | Freq: Two times a day (BID) | ORAL | Status: DC

## 2012-03-02 MED ORDER — ALBUTEROL SULFATE HFA 108 (90 BASE) MCG/ACT IN AERS
2.0000 | INHALATION_SPRAY | RESPIRATORY_TRACT | Status: DC | PRN
Start: 2012-03-02 — End: 2012-03-02
  Filled 2012-03-02: qty 6.7

## 2012-03-02 MED ORDER — PREDNISONE 10 MG PO TABS: ORAL_TABLET | ORAL | Status: DC

## 2012-03-02 MED ORDER — LISINOPRIL 20 MG PO TABS: 20.0000 mg | ORAL_TABLET | Freq: Every day | ORAL | Status: AC

## 2012-03-02 NOTE — Discharge Summary (Signed)
Physician Discharge Summary  Dennis Howard WUJ:[id number] DOB: 1965-06-11 DOA: 02/29/2012  PCP: No primary provider on file.  Admit date: 02/29/2012 Discharge date: 03/02/2012  Recommendations for Outpatient Follow-up:  1. Followup with primary medical doctor in one week from hospital discharge with repeat blood tests-BMP.  Discharge Diagnoses:  Principal Problem:  *Bronchitis, acute, with bronchospasm Active Problems:  Dyspnea  Asthma exacerbation  HTN (hypertension), malignant  Allergic rhinitis  CKD (chronic kidney disease), stage III   Discharge Condition: Improved and stable.  Diet recommendation: Heart healthy diet.  Filed Weights   02/29/12 1031  Weight: 88.451 kg (195 lb)    History of present illness:  46 year old male, with known history of HTN and allergic rhinitis, who presented to [facility] with progressive shortness of breath, chest tightness and wheeze. According to patient, he has a history of allergic rhinitis, which is perennial, but lately, has improved, which he insists is due to taking fish oil on Regular basis. His main problem otherwise, was HTN, which was diagnosed about 8 yeers ago. He followed up with Dr Rinaldo Cloud between 2005-2008, and was treated with Atenolol and Lisinopril. Since 2008, when he was out of a job, he was unable to obtain antihypertensives, and has resorted to sharing his father's Atenolol and Lisinopril, albeit, not very regularly. Im May 2013, he had an episode of chest tightness and wheeze, for which he was treated at the [facility] with nebulizers, and then discharged on Albuterol inhaler and a 7-day course of Prednisone, with resolution. Since then he has been asymptomatic until end of August to beginning of September this year, when he once again, stated having episodes of chest tightness, wheeze and dyspnea, which have become progressive, for a mionth. In the last one week, symptoms have worsened and he has a had a dry cough,  which became productive of clear phlegm from 02/26/12. He denies associated fever, chills or chest pain. In the last 2 days, he has been unable to slesp well because of his symptoms, and was awake all night on 02/28/12, due to wheeze. In AM of 02/29/12, he went to [facility], was given 2 nebulizer treatments with amelioration of symptoms, and then transferred to [facility] SDU.    Hospital Course:  1. Acute bronchitis with bronchospasm versus acute Asthma exacerbation: Patient presented with progressive shortness of breath and wheeze, intermittently progressive over the last one month, preceded by a similar episode in May 2013. Now worse in the last one week. He is also known to have allergic rhinitis history. CXR is devoid of acute pathology, and response to bronchodilator nebulizer in the ED, was dramatic. Chest auscultation revealed bilateral polyphonic expiratory wheeze. As an Herbalist, patient does travel a bit by road, so D-Dimer for completeness, and was negative at 0.30. Patient was treated with oxygen, bronchodilator nebulizations, IV steroids and Avelox. With these measures patient made improvement. On the day of discharge he denied any dyspnea, chest tightness or wheezing. He was discharged home on a steroid taper, complete 5 days of total antibiotics and when necessary albuterol inhaler. He was advised to followup with her primary medical doctor and through his PCP can be referred to a pulmonologist for formal lung function tests. 2. HTN (hypertension), malignant: BP was markedly elevated at 179/132-181/118, at presentation. This is likely due to lack of regular antihypertensive medication. Fortunately, he had no chest pain, mental status changes or acute ischemic EKG changes. Managed with iv Labetalol prn, as well  a scheduled ACE-I and calcium channel antagonist. BP is better controlled today. He will continue ACE inhibitors and Norvasc which he indicates that he will be able to  afford. 3. Allergic rhinitis: Not problematic at this time. 4. Possible chronic kidney disease, stage III. Outpatient followup, evaluation and management as deemed necessary.? Secondary to hypertensive nephrosclerosis. Advised to avoid NSAIDs. Baseline creatinine is not known. 5. Leukocytosis: Likely secondary to steroids.    Procedures:  None  Consultations:  None  Discharge Exam:  Complaints Denies complaints. No dyspnea, chest tightness, chest pain or wheezing. Filed Vitals:   03/02/12 0531 03/02/12 0725 03/02/12 1031 03/02/12 1400  BP: 134/88  160/90 152/85  Pulse: 88  98 109  Temp: 97.9 F (36.6 C)   98 F (36.7 C)  TempSrc: Oral   Oral  Resp: 16   16  Height:      Weight:      SpO2: 97% 98%  97%     General exam: Comfortable.  Respiratory system: Clear to auscultation. No increased work of breathing.  Cardiovascular system: S1 and S2 heard, regular rate and rhythm. No JVD, murmurs or gallops.  Gastrointestinal system: Abdomen is nondistended, soft and nontender. Normal bowel sounds heard.  Central nervous system: Alert and oriented. No focal neurological deficits.  Extremities: Symmetric 5 x 5 power.  Discharge Instructions      Discharge Orders    Future Orders Please Complete By Expires   Diet - low sodium heart healthy      Activity as tolerated - No restrictions      Call MD for:  difficulty breathing, headache or visual disturbances          Medication List     As of 03/03/2012  7:10 PM    STOP taking these medications         ibuprofen 200 MG tablet   Commonly known as: ADVIL,MOTRIN      UNKNOWN TO PATIENT      TAKE these medications         albuterol 108 (90 BASE) MCG/ACT inhaler   Commonly known as: PROVENTIL HFA;VENTOLIN HFA   Inhale 2 puffs into the lungs every 4 (four) hours as needed for wheezing or shortness of breath.      amLODipine 10 MG tablet   Commonly known as: NORVASC   Take 1 tablet (10 mg total) by mouth daily.        doxycycline 100 MG tablet   Commonly known as: VIBRA-TABS   Take 1 tablet (100 mg total) by mouth 2 (two) times daily.      fish oil-omega-3 fatty acids 1000 MG capsule   Take by mouth daily.      lisinopril 20 MG tablet   Commonly known as: PRINIVIL,ZESTRIL   Take 1 tablet (20 mg total) by mouth daily.      multivitamin with minerals tablet   Take 1 tablet by mouth daily.      predniSONE 10 MG tablet   Commonly known as: DELTASONE   4 tabs daily for 2 days, then 3 tabs daily for 2 days, then 2 tabs daily for 2 days, then 1 tab daily for 2 days, then stop.         Follow-up Information    Follow up with Primary Medical Doctor. Schedule an appointment as soon as possible for a visit in 1 week. (Please follow up with blood tests (BMP))    Contact information:   Please call using the list  of MD's that had been provided to you..          The results of significant diagnostics from this hospitalization (including imaging, microbiology, ancillary and laboratory) are listed below for reference.    Significant Diagnostic Studies: Dg Chest 2 View  02/29/2012  *RADIOLOGY REPORT*  Clinical Data: 46 year old male with chest pain and tightness shortness of breath.  CHEST - 2 VIEW  Comparison: None.  Findings: Normal lung volumes.  Cardiac size and mediastinal contours are within normal limits.  Visualized tracheal air column is within normal limits.  No pneumothorax, pulmonary edema, pleural effusion or confluent pulmonary opacity. No acute osseous abnormality identified.  IMPRESSION: No acute cardiopulmonary abnormality.   Original Report Authenticated By: Harley Hallmark, M.D.    2-D echocardiogram 03/01/12  Study Conclusions  Left ventricle: The cavity size was normal. Wall thickness was normal. Systolic function was normal. The estimated ejection fraction was in the range of 55% to 60%. Wall motion was normal; there were no regional wall motion abnormalities. There was an  increased relative contribution of atrial contraction to ventricular filling, which may be due to hypovolemia.   Microbiology: Recent Results (from the past 240 hour(s))  MRSA PCR SCREENING     Status: Normal   Collection Time   02/29/12  3:52 PM      Component Value Range Status Comment   MRSA by PCR NEGATIVE  NEGATIVE Final      Labs: Basic Metabolic Panel:  Lab 03/02/12 1610 03/01/12 0512 02/29/12 2133 02/29/12 1120  NA 138 138 -- 136  K 4.2 4.2 -- 3.9  CL 101 102 -- 99  CO2 24 22 -- 25  GLUCOSE 121* 137* -- 97  BUN 30* 25* -- 17  CREATININE 1.64* 1.59* 1.47* 1.50*  CALCIUM 10.0 10.0 -- 9.6  MG -- -- -- --  PHOS -- -- -- --   Liver Function Tests:  Lab 03/01/12 0512  AST 16  ALT 20  ALKPHOS 70  BILITOT 0.3  PROT 7.5  ALBUMIN 3.7   No results found for this basename: LIPASE:5,AMYLASE:5 in the last 168 hours No results found for this basename: AMMONIA:5 in the last 168 hours CBC:  Lab 03/02/12 0515 03/01/12 0512 02/29/12 2133 02/29/12 1120  WBC 16.7* 8.3 6.7 5.4  NEUTROABS -- -- -- 1.8  HGB 14.6 14.9 15.4 15.3  HCT 41.8 42.7 43.6 42.9  MCV 87.8 86.3 86.7 85.1  PLT 289 262 267 246   Cardiac Enzymes: No results found for this basename: CKTOTAL:5,CKMB:5,CKMBINDEX:5,TROPONINI:5 in the last 168 hours BNP: BNP (last 3 results) No results found for this basename: PROBNP:3 in the last 8760 hours CBG: No results found for this basename: GLUCAP:5 in the last 168 hours  Time coordinating discharge: Less than 30 minutes  Signed:  HONGALGI,ANAND  [facility] 03/03/2012, 7:10 PM

## 2012-09-12 ENCOUNTER — Emergency Department (HOSPITAL_BASED_OUTPATIENT_CLINIC_OR_DEPARTMENT_OTHER)
Admission: EM | Admit: 2012-09-12 | Discharge: 2012-09-12 | Disposition: A | Discharge status: Home / Self Care | Payer: BC Managed Care – PPO | Attending: Emergency Medicine | Admitting: Emergency Medicine

## 2012-09-12 ENCOUNTER — Encounter (HOSPITAL_BASED_OUTPATIENT_CLINIC_OR_DEPARTMENT_OTHER): Payer: Self-pay

## 2012-09-12 DIAGNOSIS — Z8709 Personal history of other diseases of the respiratory system: Secondary | ICD-10-CM | POA: Insufficient documentation

## 2012-09-12 DIAGNOSIS — J40 Bronchitis, not specified as acute or chronic: Secondary | ICD-10-CM

## 2012-09-12 DIAGNOSIS — Z87891 Personal history of nicotine dependence: Secondary | ICD-10-CM | POA: Insufficient documentation

## 2012-09-12 DIAGNOSIS — I10 Essential (primary) hypertension: Secondary | ICD-10-CM | POA: Insufficient documentation

## 2012-09-12 DIAGNOSIS — Z79899 Other long term (current) drug therapy: Secondary | ICD-10-CM | POA: Insufficient documentation

## 2012-09-12 DIAGNOSIS — R062 Wheezing: Secondary | ICD-10-CM | POA: Insufficient documentation

## 2012-09-12 MED ORDER — ALBUTEROL SULFATE (5 MG/ML) 0.5% IN NEBU
INHALATION_SOLUTION | RESPIRATORY_TRACT | Status: AC
  Administered 2012-09-12: 5 mg via RESPIRATORY_TRACT
  Filled 2012-09-12: qty 1

## 2012-09-12 MED ORDER — IPRATROPIUM BROMIDE 0.02 % IN SOLN
RESPIRATORY_TRACT | Status: AC
  Administered 2012-09-12: 0.5 mg via RESPIRATORY_TRACT
  Filled 2012-09-12: qty 2.5

## 2012-09-12 MED ORDER — ALBUTEROL SULFATE (5 MG/ML) 0.5% IN NEBU
5.0000 mg | INHALATION_SOLUTION | Freq: Once | RESPIRATORY_TRACT | Status: AC
  Administered 2012-09-12: 5 mg via RESPIRATORY_TRACT
  Filled 2012-09-12: qty 1

## 2012-09-12 MED ORDER — ALBUTEROL SULFATE (5 MG/ML) 0.5% IN NEBU
5.0000 mg | INHALATION_SOLUTION | Freq: Once | RESPIRATORY_TRACT | Status: AC
  Administered 2012-09-12: 5 mg via RESPIRATORY_TRACT

## 2012-09-12 MED ORDER — PREDNISONE 20 MG PO TABS: 60.0000 mg | ORAL_TABLET | Freq: Every day | ORAL | Status: AC

## 2012-09-12 MED ORDER — PREDNISONE 50 MG PO TABS
60.0000 mg | ORAL_TABLET | Freq: Once | ORAL | Status: AC
  Administered 2012-09-12: 60 mg via ORAL
  Filled 2012-09-12: qty 1

## 2012-09-12 MED ORDER — ALBUTEROL SULFATE HFA 108 (90 BASE) MCG/ACT IN AERS
2.0000 | INHALATION_SPRAY | RESPIRATORY_TRACT | Status: DC | PRN
  Administered 2012-09-12: 2 via RESPIRATORY_TRACT
  Filled 2012-09-12: qty 6.7

## 2012-09-12 MED ORDER — IPRATROPIUM BROMIDE 0.02 % IN SOLN
0.5000 mg | Freq: Once | RESPIRATORY_TRACT | Status: AC
  Administered 2012-09-12: 0.5 mg via RESPIRATORY_TRACT

## 2012-09-12 NOTE — ED Provider Notes (Signed)
History  This chart was scribed for Charles B. Bernette Mayers, MD by Ardeen Jourdain, ED Scribe. This patient was seen in room MH11/MH11 and the patient's care was started at 1744.  CSN: [id number]  Arrival date & time 09/12/12  1743   First MD Initiated Contact with Patient 09/12/12 1744      Chief Complaint  Patient presents with  . Cough  . Wheezing     The history is provided by the patient. No language interpreter was used.    Dennis Howard is a 47 y.o. male who presents to the Emergency Department complaining of gradual onset, gradually worsening, constant wheezing with associated productive cough that began 1 week ago. He describes his sputum as yellow in color. Pt reports his last visit due to similar onditions was 9/13. He states he was admitted due to high blood pressure. Pt denies any fever, nausea, emesis and Cp as associated symptoms.    Past Medical History  Diagnosis Date  . Bronchitis   . Environmental allergies   . Hypertension     History reviewed. No pertinent past surgical history.  No family history on file.  History  Substance Use Topics  . Smoking status: Former Games developer  . Smokeless tobacco: Never Used  . Alcohol Use: Yes     Comment: occ      Review of Systems  A complete 10 system review of systems was obtained and all systems are negative except as noted in the HPI and PMH.    Allergies  Review of patient's allergies indicates no known allergies.  Home Medications   Current Outpatient Rx  Name  Route  Sig  Dispense  Refill  . amLODipine (NORVASC) 10 MG tablet   Oral   Take 1 tablet (10 mg total) by mouth daily.   30 tablet   2   . fish oil-omega-3 fatty acids 1000 MG capsule   Oral   Take by mouth daily.         Marland Kitchen lisinopril (PRINIVIL,ZESTRIL) 20 MG tablet   Oral   Take 1 tablet (20 mg total) by mouth daily.   30 tablet   2   . Multiple Vitamins-Minerals (MULTIVITAMIN WITH MINERALS) tablet   Oral   Take 1 tablet by mouth  daily.         Marland Kitchen albuterol (PROVENTIL HFA;VENTOLIN HFA) 108 (90 BASE) MCG/ACT inhaler   Inhalation   Inhale 2 puffs into the lungs every 4 (four) hours as needed for wheezing or shortness of breath.   1 Inhaler   0   . doxycycline (VIBRA-TABS) 100 MG tablet   Oral   Take 1 tablet (100 mg total) by mouth 2 (two) times daily.   6 tablet   0   . predniSONE (DELTASONE) 10 MG tablet      4 tabs daily for 2 days, then 3 tabs daily for 2 days, then 2 tabs daily for 2 days, then 1 tab daily for 2 days, then stop.   20 tablet   0     Triage Vitals: BP 152/86  Pulse 109  Temp(Src) 98.3 F (36.8 C) (Oral)  Resp 20  Wt 195 lb (88.451 kg)  BMI 26.44 kg/m2  SpO2 91%  Physical Exam  Nursing note and vitals reviewed. Constitutional: He is oriented to person, place, and time. He appears well-developed and well-nourished. No distress.  HENT:  Head: Normocephalic and atraumatic.  Eyes: EOM are normal. Pupils are equal, round, and reactive to light.  Neck:  Normal range of motion. Neck supple.  Cardiovascular: Normal rate, regular rhythm, normal heart sounds and intact distal pulses.  Exam reveals no gallop and no friction rub.   No murmur heard. Pulmonary/Chest: Effort normal. No respiratory distress. He has wheezes. He has no rales. He exhibits no tenderness.  Abdominal: Soft. Bowel sounds are normal. He exhibits no distension. There is no tenderness.  Musculoskeletal: Normal range of motion. He exhibits no edema and no tenderness.  Neurological: He is alert and oriented to person, place, and time. He has normal strength. No cranial nerve deficit or sensory deficit.  Skin: Skin is warm and dry. No rash noted. He is not diaphoretic.  Psychiatric: He has a normal mood and affect. His behavior is normal.    ED Course  Procedures (including critical care time)  DIAGNOSTIC STUDIES: Oxygen Saturation is 91% on room air, normal by my interpretation.    COORDINATION OF CARE:  5:58  PM-Discussed treatment plan which includes an albuterol nebulizer treatment with pt at bedside and pt agreed to plan.    Labs Reviewed - No data to display No results found.   1. Bronchitis       MDM  7:27 PM Pt moving air better, but still wheezing after 2 nebs. Will continue to observe and give a third neb. SpO2 improved.   9:33 PM Significant improvement in wheezing. Pt feeling better and ready go to home. Given Albuterol HFA, prednisone burst and advised PCP followup.    I personally performed the services described in this documentation, which was scribed in my presence. The recorded information has been reviewed and is accurate.  '  Charles B. Bernette Mayers, MD 09/12/12 2134

## 2012-09-12 NOTE — ED Notes (Signed)
Per patient, he started coughing and wheezing a week ago. States that is cough is productive and has yellow sputum. Denies having a fever.

## 2012-09-12 NOTE — ED Notes (Signed)
Pt. Still noted to have wheezing and is getting 2nd treatment at this time.

## 2012-09-12 NOTE — ED Notes (Signed)
Pt. Is in no distress

## 2013-11-11 ENCOUNTER — Emergency Department (HOSPITAL_BASED_OUTPATIENT_CLINIC_OR_DEPARTMENT_OTHER)
Admission: EM | Admit: 2013-11-11 | Discharge: 2013-11-11 | Discharge status: Against Medical Advice | Payer: BC Managed Care – PPO | Attending: Emergency Medicine | Admitting: Emergency Medicine

## 2013-11-11 ENCOUNTER — Emergency Department (HOSPITAL_BASED_OUTPATIENT_CLINIC_OR_DEPARTMENT_OTHER): Payer: BC Managed Care – PPO

## 2013-11-11 ENCOUNTER — Encounter (HOSPITAL_BASED_OUTPATIENT_CLINIC_OR_DEPARTMENT_OTHER): Payer: Self-pay | Admitting: Emergency Medicine

## 2013-11-11 DIAGNOSIS — Z87891 Personal history of nicotine dependence: Secondary | ICD-10-CM | POA: Insufficient documentation

## 2013-11-11 DIAGNOSIS — J45901 Unspecified asthma with (acute) exacerbation: Secondary | ICD-10-CM | POA: Insufficient documentation

## 2013-11-11 DIAGNOSIS — IMO0002 Reserved for concepts with insufficient information to code with codable children: Secondary | ICD-10-CM | POA: Insufficient documentation

## 2013-11-11 DIAGNOSIS — Z79899 Other long term (current) drug therapy: Secondary | ICD-10-CM | POA: Insufficient documentation

## 2013-11-11 DIAGNOSIS — J45909 Unspecified asthma, uncomplicated: Secondary | ICD-10-CM

## 2013-11-11 DIAGNOSIS — I10 Essential (primary) hypertension: Secondary | ICD-10-CM | POA: Insufficient documentation

## 2013-11-11 LAB — CBC WITH DIFFERENTIAL/PLATELET
BASOS PCT: 0 % (ref 0–1)
Basophils Absolute: 0 10*3/uL (ref 0.0–0.1)
EOS ABS: 0.4 10*3/uL (ref 0.0–0.7)
Eosinophils Relative: 7 % — ABNORMAL HIGH (ref 0–5)
HEMATOCRIT: 42.5 % (ref 39.0–52.0)
Hemoglobin: 15.1 g/dL (ref 13.0–17.0)
LYMPHS ABS: 3.3 10*3/uL (ref 0.7–4.0)
Lymphocytes Relative: 54 % — ABNORMAL HIGH (ref 12–46)
MCH: 31.5 pg (ref 26.0–34.0)
MCHC: 35.5 g/dL (ref 30.0–36.0)
MCV: 88.5 fL (ref 78.0–100.0)
MONO ABS: 0.4 10*3/uL (ref 0.1–1.0)
MONOS PCT: 7 % (ref 3–12)
Neutro Abs: 1.9 10*3/uL (ref 1.7–7.7)
Neutrophils Relative %: 32 % — ABNORMAL LOW (ref 43–77)
Platelets: 282 10*3/uL (ref 150–400)
RBC: 4.8 MIL/uL (ref 4.22–5.81)
RDW: 11.9 % (ref 11.5–15.5)
WBC: 6.1 10*3/uL (ref 4.0–10.5)

## 2013-11-11 LAB — BASIC METABOLIC PANEL
BUN: 19 mg/dL (ref 6–23)
CHLORIDE: 100 meq/L (ref 96–112)
CO2: 26 mEq/L (ref 19–32)
CREATININE: 1.7 mg/dL — AB (ref 0.50–1.35)
Calcium: 9.6 mg/dL (ref 8.4–10.5)
GFR calc Af Amer: 54 mL/min — ABNORMAL LOW (ref >90)
GFR, EST NON AFRICAN AMERICAN: 47 mL/min — AB (ref >90)
Glucose, Bld: 154 mg/dL — ABNORMAL HIGH (ref 70–99)
Potassium: 3.6 mEq/L — ABNORMAL LOW (ref 3.7–5.3)
Sodium: 139 mEq/L (ref 137–147)

## 2013-11-11 MED ORDER — ALBUTEROL SULFATE (2.5 MG/3ML) 0.083% IN NEBU
2.5000 mg | INHALATION_SOLUTION | Freq: Once | RESPIRATORY_TRACT | Status: AC
Start: 2013-11-11 — End: 2013-11-11
  Administered 2013-11-11: 2.5 mg via RESPIRATORY_TRACT

## 2013-11-11 MED ORDER — ALBUTEROL (5 MG/ML) CONTINUOUS INHALATION SOLN
15.0000 mg/h | INHALATION_SOLUTION | RESPIRATORY_TRACT | Status: DC
  Administered 2013-11-11: 15 mg/h via RESPIRATORY_TRACT

## 2013-11-11 MED ORDER — IPRATROPIUM-ALBUTEROL 0.5-2.5 (3) MG/3ML IN SOLN
3.0000 mL | Freq: Once | RESPIRATORY_TRACT | Status: AC
  Administered 2013-11-11: 3 mL via RESPIRATORY_TRACT

## 2013-11-11 MED ORDER — ALBUTEROL SULFATE (2.5 MG/3ML) 0.083% IN NEBU
INHALATION_SOLUTION | RESPIRATORY_TRACT | Status: AC
  Administered 2013-11-11: 2.5 mg via RESPIRATORY_TRACT
  Filled 2013-11-11: qty 3

## 2013-11-11 MED ORDER — ALBUTEROL (5 MG/ML) CONTINUOUS INHALATION SOLN
15.0000 mg/h | INHALATION_SOLUTION | RESPIRATORY_TRACT | Status: AC
  Administered 2013-11-11: 15 mg/h via RESPIRATORY_TRACT
  Filled 2013-11-11: qty 20

## 2013-11-11 MED ORDER — METHYLPREDNISOLONE SODIUM SUCC 125 MG IJ SOLR
125.0000 mg | Freq: Once | INTRAMUSCULAR | Status: AC
  Administered 2013-11-11: 125 mg via INTRAVENOUS
  Filled 2013-11-11: qty 2

## 2013-11-11 MED ORDER — IPRATROPIUM-ALBUTEROL 0.5-2.5 (3) MG/3ML IN SOLN
RESPIRATORY_TRACT | Status: AC
  Administered 2013-11-11: 3 mL via RESPIRATORY_TRACT
  Filled 2013-11-11: qty 3

## 2013-11-11 MED ORDER — ALBUTEROL SULFATE HFA 108 (90 BASE) MCG/ACT IN AERS
2.0000 | INHALATION_SPRAY | Freq: Once | RESPIRATORY_TRACT | Status: AC
  Administered 2013-11-11: 2 via RESPIRATORY_TRACT
  Filled 2013-11-11: qty 6.7

## 2013-11-11 NOTE — ED Notes (Signed)
Pt with cough x 1 week, audible wheezing

## 2013-11-11 NOTE — ED Notes (Signed)
Patient wanting to leave AMA not wanting to be admitted. Explained to patient the seriousness of leaving and the possible rebound of his breathing condition. Patient understood and stated that he would see his doctor this AM.

## 2013-11-11 NOTE — ED Provider Notes (Signed)
CSN: [id number]     Arrival date & time 11/11/13  0109 History   First MD Initiated Contact with Patient 11/11/13 0403     Chief Complaint  Patient presents with  . Cough     (Consider location/radiation/quality/duration/timing/severity/associated sxs/prior Treatment) Patient is a 48 y.o. male presenting with cough. The history is provided by the patient.  Cough Cough characteristics:  Productive Sputum characteristics:  Clear Severity:  Moderate Onset quality:  Gradual Timing:  Intermittent Progression:  Worsening Chronicity:  Recurrent Smoker: no   Context: not fumes   Relieved by:  Nothing Worsened by:  Nothing tried Ineffective treatments:  None tried Associated symptoms: wheezing   Wheezing:    Severity:  Severe Risk factors: no recent travel     Past Medical History  Diagnosis Date  . Bronchitis   . Environmental allergies   . Hypertension    History reviewed. No pertinent past surgical history. History reviewed. No pertinent family history. History  Substance Use Topics  . Smoking status: Former Games developermoker  . Smokeless tobacco: Never Used  . Alcohol Use: Yes     Comment: occ    Review of Systems  Respiratory: Positive for cough and wheezing.   All other systems reviewed and are negative.     Allergies  Review of patient's allergies indicates no known allergies.  Home Medications   Prior to Admission medications   Medication Sig Start Date End Date Taking? Authorizing Provider  amLODipine (NORVASC) 10 MG tablet Take 1 tablet (10 mg total) by mouth daily. 03/02/12  Yes Elease EtienneAnand D Hongalgi, MD  lisinopril (PRINIVIL,ZESTRIL) 20 MG tablet Take 1 tablet (20 mg total) by mouth daily. 03/02/12  Yes Elease EtienneAnand D Hongalgi, MD  albuterol (PROVENTIL HFA;VENTOLIN HFA) 108 (90 BASE) MCG/ACT inhaler Inhale 2 puffs into the lungs every 4 (four) hours as needed for wheezing or shortness of breath. 03/02/12   Elease EtienneAnand D Hongalgi, MD  fish oil-omega-3 fatty acids 1000 MG capsule Take  by mouth daily.    Historical Provider, MD  Multiple Vitamins-Minerals (MULTIVITAMIN WITH MINERALS) tablet Take 1 tablet by mouth daily.    Historical Provider, MD  predniSONE (DELTASONE) 20 MG tablet Take 3 tablets (60 mg total) by mouth daily. 09/12/12   Charles B. Bernette MayersSheldon, MD   BP 129/84  Pulse 87  Temp(Src) 98.5 F (36.9 C) (Oral)  Resp 22  SpO2 95% Physical Exam  Constitutional: He is oriented to person, place, and time. He appears well-developed and well-nourished.  HENT:  Head: Normocephalic and atraumatic.  Mouth/Throat: Oropharynx is clear and moist.  Eyes: Conjunctivae are normal. Pupils are equal, round, and reactive to light.  Neck: Normal range of motion. Neck supple.  Cardiovascular: Normal rate, regular rhythm and intact distal pulses.   Pulmonary/Chest: He has wheezes. He has no rales.  Abdominal: Soft. Bowel sounds are normal. There is no tenderness. There is no rebound and no guarding.  Musculoskeletal: Normal range of motion. He exhibits no edema and no tenderness.  Neurological: He is alert and oriented to person, place, and time.  Skin: Skin is warm and dry. He is not diaphoretic.  Psychiatric: He has a normal mood and affect.    ED Course  Procedures (including critical care time) Labs Review Labs Reviewed  CBC WITH DIFFERENTIAL - Abnormal; Notable for the following:    Neutrophils Relative % 32 (*)    Lymphocytes Relative 54 (*)    Eosinophils Relative 7 (*)    All other components within normal limits  BASIC METABOLIC PANEL - Abnormal; Notable for the following:    Potassium 3.6 (*)    Glucose, Bld 154 (*)    Creatinine, Ser 1.70 (*)    GFR calc non Af Amer 47 (*)    GFR calc Af Amer 54 (*)    All other components within normal limits    Imaging Review Dg Chest 2 View  11/11/2013   CLINICAL DATA:  Cough for 1 week.  Wheezing.  EXAM: CHEST  2 VIEW  COMPARISON:  Chest radiograph from 02/29/2012  FINDINGS: The lungs are well-aerated. Peribronchial  thickening is noted. There is no evidence of focal opacification, pleural effusion or pneumothorax.  The heart is normal in size; the mediastinal contour is within normal limits. No acute osseous abnormalities are seen.  IMPRESSION: Peribronchial thickening noted; lungs otherwise clear.   Electronically Signed   By: Roanna RaiderJeffery  Chang M.D.   On: 11/11/2013 02:23     EKG Interpretation None      MDM   Final diagnoses:  Asthma    Medications  albuterol (PROVENTIL,VENTOLIN) solution continuous neb (0 mg/hr Nebulization Stopped 11/11/13 0344)  albuterol (PROVENTIL,VENTOLIN) solution continuous neb (0 mg/hr Nebulization Stopped 11/11/13 0533)  ipratropium-albuterol (DUONEB) 0.5-2.5 (3) MG/3ML nebulizer solution 3 mL (3 mLs Nebulization Given 11/11/13 0234)  albuterol (PROVENTIL) (2.5 MG/3ML) 0.083% nebulizer solution 2.5 mg (2.5 mg Nebulization Given 11/11/13 0235)  methylPREDNISolone sodium succinate (SOLU-MEDROL) 125 mg/2 mL injection 125 mg (125 mg Intravenous Given 11/11/13 0443)  albuterol (PROVENTIL HFA;VENTOLIN HFA) 108 (90 BASE) MCG/ACT inhaler 2 puff (2 puffs Inhalation Given 11/11/13 0536)  PERC negative wells 0   Patient needs admission but is refusing to stay stating he needs to do things at home and will have to sign out.  He has decision making capacity to refuse care.  Risks are but are not limited to death, respiratory arrest, distress and ongoing morbidity.  Inhaler and spacer provided.  He is welcomed to return at any time    Aftyn Nott Smitty CordsK Lesieli Bresee-Rasch, MD 11/11/13 [id number]

## 2013-11-11 NOTE — Patient Instructions (Signed)
Instructed patient on the proper use of administering albuterol mdi via aerochamber patient understood and tolerated well

## 2015-08-05 ENCOUNTER — Telehealth: Payer: Self-pay | Admitting: Neurology

## 2015-10-14 NOTE — Telephone Encounter (Signed)
close

## 2020-08-26 ENCOUNTER — Ambulatory Visit (INDEPENDENT_AMBULATORY_CARE_PROVIDER_SITE_OTHER): Payer: BLUE CROSS/BLUE SHIELD

## 2020-08-26 ENCOUNTER — Other Ambulatory Visit: Payer: Self-pay

## 2020-08-26 ENCOUNTER — Encounter (HOSPITAL_COMMUNITY): Payer: Self-pay

## 2020-08-26 ENCOUNTER — Emergency Department (HOSPITAL_COMMUNITY): Payer: BLUE CROSS/BLUE SHIELD

## 2020-08-26 ENCOUNTER — Encounter: Payer: Self-pay | Admitting: Emergency Medicine

## 2020-08-26 ENCOUNTER — Ambulatory Visit
Admission: EM | Admit: 2020-08-26 | Discharge: 2020-08-26 | Disposition: A | Discharge status: Home / Self Care | Payer: BLUE CROSS/BLUE SHIELD | Attending: Emergency Medicine | Admitting: Emergency Medicine

## 2020-08-26 ENCOUNTER — Emergency Department (HOSPITAL_COMMUNITY)
Admission: EM | Admit: 2020-08-26 | Discharge: 2020-08-27 | Disposition: A | Discharge status: Home / Self Care | Payer: BLUE CROSS/BLUE SHIELD | Attending: Emergency Medicine | Admitting: Emergency Medicine

## 2020-08-26 DIAGNOSIS — R0602 Shortness of breath: Secondary | ICD-10-CM

## 2020-08-26 DIAGNOSIS — J189 Pneumonia, unspecified organism: Secondary | ICD-10-CM | POA: Insufficient documentation

## 2020-08-26 DIAGNOSIS — Z79899 Other long term (current) drug therapy: Secondary | ICD-10-CM | POA: Diagnosis not present

## 2020-08-26 DIAGNOSIS — Z20822 Contact with and (suspected) exposure to covid-19: Secondary | ICD-10-CM | POA: Insufficient documentation

## 2020-08-26 DIAGNOSIS — R000 Tachycardia, unspecified: Secondary | ICD-10-CM | POA: Diagnosis not present

## 2020-08-26 DIAGNOSIS — N183 Chronic kidney disease, stage 3 unspecified: Secondary | ICD-10-CM | POA: Diagnosis not present

## 2020-08-26 DIAGNOSIS — Z87891 Personal history of nicotine dependence: Secondary | ICD-10-CM | POA: Insufficient documentation

## 2020-08-26 DIAGNOSIS — R062 Wheezing: Secondary | ICD-10-CM

## 2020-08-26 DIAGNOSIS — R0682 Tachypnea, not elsewhere classified: Secondary | ICD-10-CM

## 2020-08-26 DIAGNOSIS — I129 Hypertensive chronic kidney disease with stage 1 through stage 4 chronic kidney disease, or unspecified chronic kidney disease: Secondary | ICD-10-CM | POA: Insufficient documentation

## 2020-08-26 DIAGNOSIS — R911 Solitary pulmonary nodule: Secondary | ICD-10-CM | POA: Insufficient documentation

## 2020-08-26 DIAGNOSIS — R059 Cough, unspecified: Secondary | ICD-10-CM

## 2020-08-26 LAB — RESP PANEL BY RT-PCR (FLU A&B, COVID) ARPGX2
Influenza A by PCR: NEGATIVE
Influenza B by PCR: NEGATIVE
SARS Coronavirus 2 by RT PCR: NEGATIVE

## 2020-08-26 LAB — CBC WITH DIFFERENTIAL/PLATELET
Abs Immature Granulocytes: 0.07 10*3/uL (ref 0.00–0.07)
Basophils Absolute: 0 10*3/uL (ref 0.0–0.1)
Basophils Relative: 0 %
Eosinophils Absolute: 0 10*3/uL (ref 0.0–0.5)
Eosinophils Relative: 0 %
HCT: 44.8 % (ref 39.0–52.0)
Hemoglobin: 15.1 g/dL (ref 13.0–17.0)
Immature Granulocytes: 1 %
Lymphocytes Relative: 13 %
Lymphs Abs: 1.8 10*3/uL (ref 0.7–4.0)
MCH: 30.1 pg (ref 26.0–34.0)
MCHC: 33.7 g/dL (ref 30.0–36.0)
MCV: 89.4 fL (ref 80.0–100.0)
Monocytes Absolute: 0.9 10*3/uL (ref 0.1–1.0)
Monocytes Relative: 6 %
Neutro Abs: 11.7 10*3/uL — ABNORMAL HIGH (ref 1.7–7.7)
Neutrophils Relative %: 80 %
Platelets: 294 10*3/uL (ref 150–400)
RBC: 5.01 MIL/uL (ref 4.22–5.81)
RDW: 14.1 % (ref 11.5–15.5)
WBC: 14.5 10*3/uL — ABNORMAL HIGH (ref 4.0–10.5)
nRBC: 0 % (ref 0.0–0.2)

## 2020-08-26 LAB — COMPREHENSIVE METABOLIC PANEL
ALT: 98 U/L — ABNORMAL HIGH (ref 0–44)
AST: 59 U/L — ABNORMAL HIGH (ref 15–41)
Albumin: 2.9 g/dL — ABNORMAL LOW (ref 3.5–5.0)
Alkaline Phosphatase: 124 U/L (ref 38–126)
Anion gap: 12 (ref 5–15)
BUN: 36 mg/dL — ABNORMAL HIGH (ref 6–20)
CO2: 17 mmol/L — ABNORMAL LOW (ref 22–32)
Calcium: 8.6 mg/dL — ABNORMAL LOW (ref 8.9–10.3)
Chloride: 105 mmol/L (ref 98–111)
Creatinine, Ser: 2.14 mg/dL — ABNORMAL HIGH (ref 0.61–1.24)
GFR, Estimated: 36 mL/min — ABNORMAL LOW (ref >60)
Glucose, Bld: 97 mg/dL (ref 70–99)
Potassium: 5 mmol/L (ref 3.5–5.1)
Sodium: 134 mmol/L — ABNORMAL LOW (ref 135–145)
Total Bilirubin: 0.9 mg/dL (ref 0.3–1.2)
Total Protein: 6.6 g/dL (ref 6.5–8.1)

## 2020-08-26 LAB — LIPASE, BLOOD: Lipase: 32 U/L (ref 11–51)

## 2020-08-26 LAB — LACTIC ACID, PLASMA
Lactic Acid, Venous: 1.9 mmol/L (ref 0.5–1.9)
Lactic Acid, Venous: 1.7 mmol/L (ref 0.5–1.9)

## 2020-08-26 MED ORDER — IOHEXOL 350 MG/ML SOLN
60.0000 mL | Freq: Once | INTRAVENOUS | Status: AC | PRN
  Administered 2020-08-26: 60 mL via INTRAVENOUS

## 2020-08-26 MED ORDER — DOXYCYCLINE HYCLATE 100 MG PO CAPS: 100.0000 mg | ORAL_CAPSULE | Freq: Two times a day (BID) | ORAL | 0 refills | Status: AC

## 2020-08-26 MED ORDER — DOXYCYCLINE HYCLATE 100 MG PO TABS
100.0000 mg | ORAL_TABLET | Freq: Once | ORAL | Status: AC
  Administered 2020-08-26: 100 mg via ORAL
  Filled 2020-08-26: qty 1

## 2020-08-26 NOTE — ED Notes (Signed)
Pt returned from CT scan.

## 2020-08-26 NOTE — ED Triage Notes (Signed)
Pt bib [facility] from [facility]. Pt sent for abnormal ECG and chest x-ray.  Pt went to [facility] for shortness of breath x 2 weeks, worsening and intermittent upper abdominal pain. Pt alert and oriented on arrival.   [facility] VS: 155/92 HR=116 RR=24 CBG=130

## 2020-08-26 NOTE — ED Triage Notes (Signed)
Patient has had respiratory symptoms over the last 2 weeks, thought to be improving .  Breathing, fatigue and sob significantly worsened yesterday

## 2020-08-26 NOTE — ED Notes (Signed)
[facility] have left with patient

## 2020-08-26 NOTE — ED Provider Notes (Signed)
[facility] EMERGENCY DEPARTMENT Provider Note   CSN: [id number] Arrival date & time: 08/26/20  1739     History Chief Complaint  Patient presents with  . Shortness of Breath    Dennis Duce. is a 55 y.o. male.  The history is provided by the patient and medical records. No language interpreter was used.  Shortness of Breath Severity:  Severe Onset quality:  Gradual Duration:  2 weeks Timing:  Constant Progression:  Worsening Chronicity:  New Context: URI   Relieved by:  Nothing Worsened by:  Nothing Ineffective treatments:  None tried Associated symptoms: cough and sputum production (resolved sputum)   Associated symptoms: no abdominal pain, no chest pain, no diaphoresis, no fever, no headaches, no hemoptysis, no neck pain, no PND, no rash, no swollen glands, no vomiting and no wheezing   Risk factors: no hx of PE/DVT        Past Medical History:  Diagnosis Date  . Bronchitis   . Environmental allergies   . Hypertension     Patient Active Problem List   Diagnosis Date Noted  . CKD (chronic kidney disease), stage III (HCC) 03/02/2012  . Bronchitis, acute, with bronchospasm 03/02/2012  . Dyspnea 02/29/2012  . Asthma exacerbation 02/29/2012  . HTN (hypertension), malignant 02/29/2012  . Allergic rhinitis 02/29/2012    History reviewed. No pertinent surgical history.     History reviewed. No pertinent family history.  Social History   Tobacco Use  . Smoking status: Former Games developer  . Smokeless tobacco: Never Used  Vaping Use  . Vaping Use: Never used  Substance Use Topics  . Alcohol use: Yes    Comment: occ  . Drug use: No    Home Medications Prior to Admission medications   Medication Sig Start Date End Date Taking? Authorizing Provider  albuterol (PROVENTIL HFA;VENTOLIN HFA) 108 (90 BASE) MCG/ACT inhaler Inhale 2 puffs into the lungs every 4 (four) hours as needed for wheezing or shortness of breath. 03/02/12   Hongalgi, Maximino Greenland, MD  amLODipine (NORVASC) 10 MG tablet Take 1 tablet (10 mg total) by mouth daily. 03/02/12   Hongalgi, Maximino Greenland, MD  fish oil-omega-3 fatty acids 1000 MG capsule Take by mouth daily.    [provider]  lisinopril (PRINIVIL,ZESTRIL) 20 MG tablet Take 1 tablet (20 mg total) by mouth daily. 03/02/12   Hongalgi, Maximino Greenland, MD  Multiple Vitamins-Minerals (MULTIVITAMIN WITH MINERALS) tablet Take 1 tablet by mouth daily.    [provider]  predniSONE (DELTASONE) 20 MG tablet Take 3 tablets (60 mg total) by mouth daily. 09/12/12   Pollyann Savoy, MD    Allergies    Patient has no known allergies.  Review of Systems   Review of Systems  Constitutional: Positive for fatigue. Negative for chills, diaphoresis and fever.  HENT: Negative for congestion.   Eyes: Negative for visual disturbance.  Respiratory: Positive for cough, sputum production (resolved sputum), chest tightness and shortness of breath. Negative for hemoptysis, choking, wheezing and stridor.   Cardiovascular: Negative for chest pain, palpitations, leg swelling and PND.  Gastrointestinal: Negative for abdominal pain, constipation, diarrhea, nausea and vomiting.  Genitourinary: Negative for dysuria and flank pain.  Musculoskeletal: Negative for neck pain and neck stiffness.  Skin: Negative for rash and wound.  Neurological: Negative for dizziness, light-headedness and headaches.  Psychiatric/Behavioral: Negative for agitation and confusion.  All other systems reviewed and are negative.   Physical Exam Updated Vital Signs BP (!) 145/103 (BP Location:  Right Arm)   Pulse (!) 117   Temp 99.3 F (37.4 C) (Oral)   Resp 15   SpO2 100%   Physical Exam Vitals and nursing note reviewed.  Constitutional:      General: Dennis Howard is not in acute distress.    Appearance: Dennis Howard is well-developed. Dennis Howard is not ill-appearing, toxic-appearing or diaphoretic.  HENT:     Head: Normocephalic and atraumatic.     Mouth/Throat:      Pharynx: Oropharynx is clear.  Eyes:     Conjunctiva/sclera: Conjunctivae normal.     Pupils: Pupils are equal, round, and reactive to light.  Cardiovascular:     Rate and Rhythm: Regular rhythm. Tachycardia present.     Pulses: Normal pulses.     Heart sounds: No murmur heard.   Pulmonary:     Effort: Pulmonary effort is normal. No respiratory distress.     Breath sounds: Rhonchi present. No decreased breath sounds, wheezing or rales.  Chest:     Chest wall: No tenderness.  Abdominal:     Palpations: Abdomen is soft.     Tenderness: There is no abdominal tenderness.  Musculoskeletal:     Cervical back: Normal range of motion and neck supple.     Right lower leg: No tenderness. No edema.     Left lower leg: No tenderness. No edema.  Skin:    General: Skin is warm and dry.     Capillary Refill: Capillary refill takes less than 2 seconds.     Findings: No erythema.  Neurological:     General: No focal deficit present.     Mental Status: Dennis Howard is alert.  Psychiatric:        Mood and Affect: Mood normal.     ED Results / Procedures / Treatments   Labs (all labs ordered are listed, but only abnormal results are displayed) Labs Reviewed  CBC WITH DIFFERENTIAL/PLATELET - Abnormal; Notable for the following components:      Result Value   WBC 14.5 (*)    Neutro Abs 11.7 (*)    All other components within normal limits  COMPREHENSIVE METABOLIC PANEL - Abnormal; Notable for the following components:   Sodium 134 (*)    CO2 17 (*)    BUN 36 (*)    Creatinine, Ser 2.14 (*)    Calcium 8.6 (*)    Albumin 2.9 (*)    AST 59 (*)    ALT 98 (*)    GFR, Estimated 36 (*)    All other components within normal limits  RESP PANEL BY RT-PCR (FLU A&B, COVID) ARPGX2  URINE CULTURE  CULTURE, BLOOD (ROUTINE X 2)  CULTURE, BLOOD (ROUTINE X 2)  LACTIC ACID, PLASMA  LACTIC ACID, PLASMA  LIPASE, BLOOD  URINALYSIS, ROUTINE W REFLEX MICROSCOPIC    EKG None  Radiology DG Chest 2  View  Result Date: 08/26/2020 CLINICAL DATA:  Wheezing, shortness of breath. EXAM: CHEST - 2 VIEW COMPARISON:  November 11, 2013. FINDINGS: The heart size and mediastinal contours are within normal limits. No pneumothorax pleural effusion is noted. Left lung is clear. New right perihilar opacity is noted which may represent pneumonia, but neoplasm cannot be excluded. The visualized skeletal structures are unremarkable. IMPRESSION: New right perihilar opacity is noted which may represent pneumonia, but neoplasm cannot be excluded. CT scan of the chest is recommended for further evaluation. Electronically Signed   By: Lupita Raider M.D.   On: 08/26/2020 16:44   CT Angio Chest PE  W and/or Wo Contrast  Result Date: 08/26/2020 CLINICAL DATA:  Wheezing and shortness of breath. Concern for pneumonia. Concern for pulmonary embolus. EXAM: CT ANGIOGRAPHY CHEST WITH CONTRAST TECHNIQUE: Multidetector CT imaging of the chest was performed using the standard protocol during bolus administration of intravenous contrast. Multiplanar CT image reconstructions and MIPs were obtained to evaluate the vascular anatomy. CONTRAST:  22mL OMNIPAQUE IOHEXOL 350 MG/ML SOLN COMPARISON:  cxr/28/22 FINDINGS: Cardiovascular: Satisfactory opacification of the pulmonary arteries to the segmental level. No evidence of pulmonary embolism. Enlarged left ventricle. No pericardial effusion. Mediastinum/Nodes: Bilateral hilar lymphadenopathy. No enlarged mediastinal or axillary lymph nodes. Thyroid gland, trachea, and esophagus demonstrate no significant findings. Lungs/Pleura: Interval development of peribronchovascular and peripheral patchy consolidations. Interval development of a 9 mm nodular like density at the left base. No cavitation. Trace to small volume right pleural effusion. No pneumothorax. Upper Abdomen: No acute abnormality. Musculoskeletal: No chest wall abnormality. No acute or significant osseous findings. Review of the MIP images  confirms the above findings. IMPRESSION: 1. No pulmonary embolus. 2. Findings suggestive of multifocal pneumonia with a trace to small volume right pleural effusion. 3. Associated interval development of a 9 mm nodular-like density at the left base. Non-contrast chest CT at 3-6 months is recommended. If nodules persist, subsequent management will be based upon the most suspicious nodule(s). This recommendation follows the consensus statement: Guidelines for Management of Incidental Pulmonary Nodules Detected on CT Images: From the Fleischner Society 2017; Radiology 2017; 284:228-243. 4. Associated bilateral hilar lymphadenopathy that is likely reactive in etiology. 5. Enlarged left ventricle. Electronically Signed   By: Tish Frederickson M.D.   On: 08/26/2020 20:32    Procedures Procedures   Medications Ordered in ED Medications  iohexol (OMNIPAQUE) 350 MG/ML injection 60 mL (60 mLs Intravenous Contrast Given 08/26/20 1957)  doxycycline (VIBRA-TABS) tablet 100 mg (100 mg Oral Given 08/26/20 2321)    ED Course  I have reviewed the triage vital signs and the nursing notes.  Pertinent labs & imaging results that were available during my care of the patient were reviewed by me and considered in my medical decision making (see chart for details).    MDM Rules/Calculators/A&P                          Dennis Howard. is a 55 y.o. male with a past medical history significant for asthma, hypertension, CKD, chronic bronchitis, and allergies who presents from an urgent care for further evaluation of shortness of breath, tachycardia, cough, and abnormal chest x-ray.  According to patient, the last 2 weeks Dennis Howard has had cough and shortness of breath.  Dennis Howard thought Dennis Howard had bronchitis over the last week and a half that has started to improve.  Dennis Howard says that over the last 2 days Dennis Howard has had more shortness of breath than before.  Dennis Howard denies chest pain but reports his shortness breath is exertional.  Dennis Howard reports no  significant nausea, vomiting, constipation, diarrhea, dysuria, palpitations.  Dennis Howard is primarily  complaining of the shortness of breath.  Dennis Howard went to urgent care where Dennis Howard was found to have a normal chest x-ray concerning for pneumonia versus other abnormality.  Dennis Howard also had a reportedly abnormal EKG which I cannot see in the chart.  Dennis Howard is still denying any chest pain.  Dennis Howard is temperature on arrival here is 99.3, will get core temperature rectally to check for fever.  Dennis Howard denies other chills.  Dennis Howard is tachycardic on  arrival.  Dennis Howard is also on 2 L nasal cannula oxygen for comfort.    On exam, Dennis Howard has rhonchi in the bases of his lungs bilaterally.  Chest abdomen nontender.  Good pulses in extremities.  No lower extremity edema or tenderness.  Dennis Howard denies history of DVT or PE.  Denies any trauma.  No back tenderness on exam.  Patient otherwise well-appearing.  EKG will be repeated here.  ED ECG REPORT   Date: 08/26/2020  Rate: 114  Rhythm: sinus tachycardia  QRS Axis: normal  Intervals: normal  ST/T Wave abnormalities: nonspecific T wave changes  Conduction Disutrbances:none  Narrative Interpretation:   Old EKG Reviewed: changes noted  I have personally reviewed the EKG tracing and agree with the computerized printout as noted.   Given this abnormality on chest x-ray and radiology request for CT scan, will get CT PE study to rule out PE given the tachycardia and shortness of breath.  Will ambulate with pulse ox to see if Dennis Howard gets hypoxic.  We will get screening labs.  If Dennis Howard is hypoxic or work-up is concerning, patient may need admission for management of pneumonia or other findings.  Dennis Howard reports Dennis Howard does not any Covid contacts but has not been vaccinated against COVID-19.  Anticipate reassessment.     Patient CT scan does not show pulmonary embolism but does show pneumonia.  There is also a nodule that was likely seen on x-ray.  Patient was informed of this and will follow up in several months for repeat.  We  discussed admission versus discharge home and Dennis Howard would like to try going home as his oxygen saturations remained normal during ambulation and his vital signs have improved here.  Patient will be given a dose of doxycycline and prescription for doxycycline.  Patient will follow up with PCP and understands strict return precautions.  Patient with questions or concerns and was discharged in good condition with improved symptoms.   Final Clinical Impression(s) / ED Diagnoses Final diagnoses:  Community acquired pneumonia, unspecified laterality  Shortness of breath  Cough  Pulmonary nodule    Rx / DC Orders ED Discharge Orders         Ordered    doxycycline (VIBRAMYCIN) 100 MG capsule  2 times daily        08/26/20 2307         Clinical Impression: 1. Community acquired pneumonia, unspecified laterality   2. Shortness of breath   3. Cough   4. Pulmonary nodule     Disposition: Admit  This note was prepared with assistance of Dragon voice recognition software. Occasional wrong-word or sound-a-like substitutions may have occurred due to the inherent limitations of voice recognition software.     Welden Hausmann, Canary Brimhristopher J, MD 08/26/20 [phone]

## 2020-08-26 NOTE — ED Notes (Signed)
Pt to restroom, unable to urinate.

## 2020-08-26 NOTE — ED Provider Notes (Signed)
[facility]    CSN: [id number] Arrival date & time: 08/26/20  1547      History   Chief Complaint Chief Complaint  Patient presents with  . Cough  . Shortness of Breath    HPI Dennis Howard. is a 55 y.o. male.   Dennis Howard. presents with complaints of cough and shortness of breath which started 2 weeks ago and worsened today. No known fevers. Originally was productive but now it is dry. Prior to illness he smoked marijuana daily, recently hasn't been smoking. States history of bronchitis. No leg swelling or other known swelling. Hasn't taken any medications for symptoms. No urinary changes or decreased urination. No nausea vomiting or diarrhea. Has had some decreased appetite and increased flatulence. History of hypertension and cdk. No other known cardiac history.    ROS per HPI, negative if not otherwise mentioned.      Past Medical History:  Diagnosis Date  . Bronchitis   . Environmental allergies   . Hypertension     Patient Active Problem List   Diagnosis Date Noted  . CKD (chronic kidney disease), stage III (HCC) 03/02/2012  . Bronchitis, acute, with bronchospasm 03/02/2012  . Dyspnea 02/29/2012  . Asthma exacerbation 02/29/2012  . HTN (hypertension), malignant 02/29/2012  . Allergic rhinitis 02/29/2012    History reviewed. No pertinent surgical history.     Home Medications    Prior to Admission medications   Medication Sig Start Date End Date Taking? Authorizing Provider  fish oil-omega-3 fatty acids 1000 MG capsule Take by mouth daily.   Yes [provider]  Multiple Vitamins-Minerals (MULTIVITAMIN WITH MINERALS) tablet Take 1 tablet by mouth daily.   Yes [provider]  albuterol (PROVENTIL HFA;VENTOLIN HFA) 108 (90 BASE) MCG/ACT inhaler Inhale 2 puffs into the lungs every 4 (four) hours as needed for wheezing or shortness of breath. 03/02/12   Hongalgi, Maximino Greenland, MD  amLODipine (NORVASC) 10 MG tablet Take 1  tablet (10 mg total) by mouth daily. 03/02/12   Hongalgi, Maximino Greenland, MD  lisinopril (PRINIVIL,ZESTRIL) 20 MG tablet Take 1 tablet (20 mg total) by mouth daily. 03/02/12   Hongalgi, Maximino Greenland, MD  predniSONE (DELTASONE) 20 MG tablet Take 3 tablets (60 mg total) by mouth daily. 09/12/12   Pollyann Savoy, MD    Family History History reviewed. No pertinent family history.  Social History Social History   Tobacco Use  . Smoking status: Former Games developer  . Smokeless tobacco: Never Used  Vaping Use  . Vaping Use: Never used  Substance Use Topics  . Alcohol use: Yes    Comment: occ  . Drug use: No     Allergies   Patient has no known allergies.   Review of Systems Review of Systems   Physical Exam Triage Vital Signs ED Triage Vitals [08/26/20 1555]  Enc Vitals Group     BP      Pulse Rate (!) 125     Resp (!) 40     Temp      Temp src      SpO2 93 %     Weight      Height      Head Circumference      Peak Flow      Pain Score      Pain Loc      Pain Edu?      Excl. in GC?    No data found.  Updated Vital Signs Pulse Marland Kitchen)  125   Resp (!) 40   SpO2 93%   Visual Acuity Right Eye Distance:   Left Eye Distance:   Bilateral Distance:    Right Eye Near:   Left Eye Near:    Bilateral Near:     Physical Exam Constitutional:      Appearance: He is well-developed. He is not toxic-appearing.  Cardiovascular:     Rate and Rhythm: Normal rate.  Pulmonary:     Effort: Pulmonary effort is normal. Tachypnea present.     Breath sounds: Decreased breath sounds present.     Comments: Tachypnea noted without significant wheezing noted Chest:     Chest wall: No tenderness.  Skin:    General: Skin is warm and dry.  Neurological:     Mental Status: He is alert and oriented to person, place, and time.    ekg appears changed from previous ekg, with increased rate of 122. Discussed with supervising physician dr. Leonides Grills   UC Treatments / Results  Labs (all labs ordered  are listed, but only abnormal results are displayed) Labs Reviewed - No data to display  EKG   Radiology DG Chest 2 View  Result Date: 08/26/2020 CLINICAL DATA:  Wheezing, shortness of breath. EXAM: CHEST - 2 VIEW COMPARISON:  November 11, 2013. FINDINGS: The heart size and mediastinal contours are within normal limits. No pneumothorax pleural effusion is noted. Left lung is clear. New right perihilar opacity is noted which may represent pneumonia, but neoplasm cannot be excluded. The visualized skeletal structures are unremarkable. IMPRESSION: New right perihilar opacity is noted which may represent pneumonia, but neoplasm cannot be excluded. CT scan of the chest is recommended for further evaluation. Electronically Signed   By: Lupita Raider M.D.   On: 08/26/2020 16:44    Procedures Procedures (including critical care time)  Medications Ordered in UC Medications - No data to display  Initial Impression / Assessment and Plan / UC Course  I have reviewed the triage vital signs and the nursing notes.  Pertinent labs & imaging results that were available during my care of the patient were reviewed by me and considered in my medical decision making (see chart for details).     Tachypnea, tachycardia, abnormal ekg, and abnormal chest xray. Concern about cardiac source of symptoms vs pneumonia?  Do not feel he is safe to drive in current status, ems called for transport and taken to ER now. Patient verbalized understanding and agreeable to plan.   Final Clinical Impressions(s) / UC Diagnoses   Final diagnoses:  Shortness of breath  Tachycardia  Tachypnea   Discharge Instructions   None    ED Prescriptions    None     PDMP not reviewed this encounter.   Georgetta Haber, NP 08/26/20 1650

## 2020-08-26 NOTE — ED Notes (Signed)
Pt to radiology.

## 2020-08-26 NOTE — Discharge Instructions (Addendum)
Your work-up today was consistent with a pneumonia.  Please take the antibiotics to treat and follow-up with a primary doctor.  Your oxygen saturations did not go into the 80s during ambulation today so after our shared decision-making conversation, we feel you are safe for discharge home.  The CT scan did show a small pulmonary nodule which you need to follow-up with your primary doctor about.  Please rest and stay hydrated.  If any symptoms change or worsen, please return immediately to the nearest emergency department.

## 2020-08-26 NOTE — ED Notes (Signed)
EMS at bedside

## 2020-08-31 LAB — CULTURE, BLOOD (ROUTINE X 2)
Culture: NO GROWTH
Culture: NO GROWTH

## 2020-09-13 ENCOUNTER — Encounter (HOSPITAL_COMMUNITY): Payer: Self-pay | Admitting: Emergency Medicine

## 2020-09-13 ENCOUNTER — Emergency Department (HOSPITAL_COMMUNITY): Payer: BLUE CROSS/BLUE SHIELD

## 2020-09-13 ENCOUNTER — Inpatient Hospital Stay (HOSPITAL_COMMUNITY)
Admission: EM | Admit: 2020-09-13 | Discharge: 2020-10-30 | DRG: 987 | Disposition: E | Payer: BLUE CROSS/BLUE SHIELD | Attending: Critical Care Medicine | Admitting: Critical Care Medicine

## 2020-09-13 ENCOUNTER — Inpatient Hospital Stay (HOSPITAL_COMMUNITY): Payer: BLUE CROSS/BLUE SHIELD

## 2020-09-13 ENCOUNTER — Other Ambulatory Visit: Payer: Self-pay

## 2020-09-13 DIAGNOSIS — I739 Peripheral vascular disease, unspecified: Secondary | ICD-10-CM | POA: Diagnosis present

## 2020-09-13 DIAGNOSIS — N1832 Chronic kidney disease, stage 3b: Secondary | ICD-10-CM | POA: Diagnosis present

## 2020-09-13 DIAGNOSIS — E8809 Other disorders of plasma-protein metabolism, not elsewhere classified: Secondary | ICD-10-CM | POA: Diagnosis present

## 2020-09-13 DIAGNOSIS — M6282 Rhabdomyolysis: Secondary | ICD-10-CM | POA: Diagnosis not present

## 2020-09-13 DIAGNOSIS — E43 Unspecified severe protein-calorie malnutrition: Secondary | ICD-10-CM | POA: Diagnosis present

## 2020-09-13 DIAGNOSIS — Z452 Encounter for adjustment and management of vascular access device: Secondary | ICD-10-CM

## 2020-09-13 DIAGNOSIS — I5023 Acute on chronic systolic (congestive) heart failure: Secondary | ICD-10-CM | POA: Diagnosis present

## 2020-09-13 DIAGNOSIS — K7200 Acute and subacute hepatic failure without coma: Principal | ICD-10-CM | POA: Diagnosis present

## 2020-09-13 DIAGNOSIS — D62 Acute posthemorrhagic anemia: Secondary | ICD-10-CM | POA: Diagnosis not present

## 2020-09-13 DIAGNOSIS — G839 Paralytic syndrome, unspecified: Secondary | ICD-10-CM | POA: Diagnosis not present

## 2020-09-13 DIAGNOSIS — D696 Thrombocytopenia, unspecified: Secondary | ICD-10-CM

## 2020-09-13 DIAGNOSIS — I429 Cardiomyopathy, unspecified: Secondary | ICD-10-CM | POA: Diagnosis present

## 2020-09-13 DIAGNOSIS — Z7952 Long term (current) use of systemic steroids: Secondary | ICD-10-CM

## 2020-09-13 DIAGNOSIS — R760 Raised antibody titer: Secondary | ICD-10-CM | POA: Diagnosis present

## 2020-09-13 DIAGNOSIS — R600 Localized edema: Secondary | ICD-10-CM

## 2020-09-13 DIAGNOSIS — R570 Cardiogenic shock: Secondary | ICD-10-CM | POA: Diagnosis not present

## 2020-09-13 DIAGNOSIS — B379 Candidiasis, unspecified: Secondary | ICD-10-CM

## 2020-09-13 DIAGNOSIS — A419 Sepsis, unspecified organism: Secondary | ICD-10-CM | POA: Diagnosis not present

## 2020-09-13 DIAGNOSIS — J9690 Respiratory failure, unspecified, unspecified whether with hypoxia or hypercapnia: Secondary | ICD-10-CM

## 2020-09-13 DIAGNOSIS — D7582 Heparin induced thrombocytopenia (HIT): Secondary | ICD-10-CM | POA: Diagnosis not present

## 2020-09-13 DIAGNOSIS — E870 Hyperosmolality and hypernatremia: Secondary | ICD-10-CM | POA: Diagnosis not present

## 2020-09-13 DIAGNOSIS — I420 Dilated cardiomyopathy: Secondary | ICD-10-CM | POA: Diagnosis not present

## 2020-09-13 DIAGNOSIS — Z87891 Personal history of nicotine dependence: Secondary | ICD-10-CM | POA: Diagnosis not present

## 2020-09-13 DIAGNOSIS — E785 Hyperlipidemia, unspecified: Secondary | ICD-10-CM | POA: Diagnosis present

## 2020-09-13 DIAGNOSIS — M79604 Pain in right leg: Secondary | ICD-10-CM | POA: Diagnosis not present

## 2020-09-13 DIAGNOSIS — I509 Heart failure, unspecified: Secondary | ICD-10-CM | POA: Diagnosis not present

## 2020-09-13 DIAGNOSIS — Z20822 Contact with and (suspected) exposure to covid-19: Secondary | ICD-10-CM | POA: Diagnosis present

## 2020-09-13 DIAGNOSIS — D689 Coagulation defect, unspecified: Secondary | ICD-10-CM | POA: Diagnosis present

## 2020-09-13 DIAGNOSIS — R579 Shock, unspecified: Secondary | ICD-10-CM | POA: Diagnosis not present

## 2020-09-13 DIAGNOSIS — N170 Acute kidney failure with tubular necrosis: Secondary | ICD-10-CM | POA: Diagnosis present

## 2020-09-13 DIAGNOSIS — J9601 Acute respiratory failure with hypoxia: Secondary | ICD-10-CM | POA: Diagnosis not present

## 2020-09-13 DIAGNOSIS — J40 Bronchitis, not specified as acute or chronic: Secondary | ICD-10-CM | POA: Diagnosis present

## 2020-09-13 DIAGNOSIS — M3320 Polymyositis, organ involvement unspecified: Secondary | ICD-10-CM | POA: Diagnosis not present

## 2020-09-13 DIAGNOSIS — I358 Other nonrheumatic aortic valve disorders: Secondary | ICD-10-CM | POA: Diagnosis present

## 2020-09-13 DIAGNOSIS — Z803 Family history of malignant neoplasm of breast: Secondary | ICD-10-CM

## 2020-09-13 DIAGNOSIS — Z7189 Other specified counseling: Secondary | ICD-10-CM | POA: Diagnosis not present

## 2020-09-13 DIAGNOSIS — B370 Candidal stomatitis: Secondary | ICD-10-CM | POA: Diagnosis not present

## 2020-09-13 DIAGNOSIS — E162 Hypoglycemia, unspecified: Secondary | ICD-10-CM | POA: Diagnosis not present

## 2020-09-13 DIAGNOSIS — G9340 Encephalopathy, unspecified: Secondary | ICD-10-CM | POA: Diagnosis not present

## 2020-09-13 DIAGNOSIS — I5082 Biventricular heart failure: Secondary | ICD-10-CM | POA: Diagnosis not present

## 2020-09-13 DIAGNOSIS — Z9911 Dependence on respirator [ventilator] status: Secondary | ICD-10-CM | POA: Diagnosis not present

## 2020-09-13 DIAGNOSIS — N19 Unspecified kidney failure: Secondary | ICD-10-CM | POA: Diagnosis not present

## 2020-09-13 DIAGNOSIS — I130 Hypertensive heart and chronic kidney disease with heart failure and stage 1 through stage 4 chronic kidney disease, or unspecified chronic kidney disease: Secondary | ICD-10-CM | POA: Diagnosis present

## 2020-09-13 DIAGNOSIS — G9341 Metabolic encephalopathy: Secondary | ICD-10-CM | POA: Diagnosis not present

## 2020-09-13 DIAGNOSIS — Z79899 Other long term (current) drug therapy: Secondary | ICD-10-CM

## 2020-09-13 DIAGNOSIS — R918 Other nonspecific abnormal finding of lung field: Secondary | ICD-10-CM | POA: Diagnosis not present

## 2020-09-13 DIAGNOSIS — R569 Unspecified convulsions: Secondary | ICD-10-CM | POA: Diagnosis not present

## 2020-09-13 DIAGNOSIS — T380X5A Adverse effect of glucocorticoids and synthetic analogues, initial encounter: Secondary | ICD-10-CM | POA: Diagnosis not present

## 2020-09-13 DIAGNOSIS — E875 Hyperkalemia: Secondary | ICD-10-CM

## 2020-09-13 DIAGNOSIS — I513 Intracardiac thrombosis, not elsewhere classified: Secondary | ICD-10-CM | POA: Diagnosis present

## 2020-09-13 DIAGNOSIS — S7012XA Contusion of left thigh, initial encounter: Secondary | ICD-10-CM | POA: Diagnosis present

## 2020-09-13 DIAGNOSIS — K7290 Hepatic failure, unspecified without coma: Secondary | ICD-10-CM | POA: Diagnosis not present

## 2020-09-13 DIAGNOSIS — L97129 Non-pressure chronic ulcer of left thigh with unspecified severity: Secondary | ICD-10-CM | POA: Diagnosis present

## 2020-09-13 DIAGNOSIS — Z515 Encounter for palliative care: Secondary | ICD-10-CM | POA: Diagnosis not present

## 2020-09-13 DIAGNOSIS — I214 Non-ST elevation (NSTEMI) myocardial infarction: Secondary | ICD-10-CM | POA: Diagnosis present

## 2020-09-13 DIAGNOSIS — R531 Weakness: Secondary | ICD-10-CM | POA: Diagnosis not present

## 2020-09-13 DIAGNOSIS — R069 Unspecified abnormalities of breathing: Secondary | ICD-10-CM

## 2020-09-13 DIAGNOSIS — K761 Chronic passive congestion of liver: Secondary | ICD-10-CM | POA: Diagnosis present

## 2020-09-13 DIAGNOSIS — I63531 Cerebral infarction due to unspecified occlusion or stenosis of right posterior cerebral artery: Secondary | ICD-10-CM | POA: Diagnosis not present

## 2020-09-13 DIAGNOSIS — Z8701 Personal history of pneumonia (recurrent): Secondary | ICD-10-CM

## 2020-09-13 DIAGNOSIS — M79605 Pain in left leg: Secondary | ICD-10-CM | POA: Diagnosis not present

## 2020-09-13 DIAGNOSIS — R739 Hyperglycemia, unspecified: Secondary | ICD-10-CM | POA: Diagnosis not present

## 2020-09-13 DIAGNOSIS — E871 Hypo-osmolality and hyponatremia: Secondary | ICD-10-CM | POA: Diagnosis present

## 2020-09-13 DIAGNOSIS — J189 Pneumonia, unspecified organism: Secondary | ICD-10-CM

## 2020-09-13 DIAGNOSIS — Z66 Do not resuscitate: Secondary | ICD-10-CM | POA: Diagnosis not present

## 2020-09-13 DIAGNOSIS — R627 Adult failure to thrive: Secondary | ICD-10-CM | POA: Diagnosis present

## 2020-09-13 DIAGNOSIS — R609 Edema, unspecified: Secondary | ICD-10-CM | POA: Diagnosis not present

## 2020-09-13 DIAGNOSIS — J690 Pneumonitis due to inhalation of food and vomit: Secondary | ICD-10-CM | POA: Diagnosis present

## 2020-09-13 DIAGNOSIS — Z4659 Encounter for fitting and adjustment of other gastrointestinal appliance and device: Secondary | ICD-10-CM | POA: Diagnosis not present

## 2020-09-13 DIAGNOSIS — E872 Acidosis: Secondary | ICD-10-CM | POA: Diagnosis present

## 2020-09-13 DIAGNOSIS — I340 Nonrheumatic mitral (valve) insufficiency: Secondary | ICD-10-CM | POA: Diagnosis not present

## 2020-09-13 DIAGNOSIS — I5021 Acute systolic (congestive) heart failure: Secondary | ICD-10-CM | POA: Diagnosis not present

## 2020-09-13 DIAGNOSIS — D649 Anemia, unspecified: Secondary | ICD-10-CM | POA: Diagnosis not present

## 2020-09-13 DIAGNOSIS — I639 Cerebral infarction, unspecified: Secondary | ICD-10-CM | POA: Diagnosis not present

## 2020-09-13 DIAGNOSIS — K1230 Oral mucositis (ulcerative), unspecified: Secondary | ICD-10-CM | POA: Diagnosis not present

## 2020-09-13 DIAGNOSIS — R1310 Dysphagia, unspecified: Secondary | ICD-10-CM | POA: Diagnosis not present

## 2020-09-13 DIAGNOSIS — E876 Hypokalemia: Secondary | ICD-10-CM | POA: Diagnosis not present

## 2020-09-13 DIAGNOSIS — N189 Chronic kidney disease, unspecified: Secondary | ICD-10-CM | POA: Diagnosis not present

## 2020-09-13 DIAGNOSIS — N179 Acute kidney failure, unspecified: Secondary | ICD-10-CM

## 2020-09-13 DIAGNOSIS — I63431 Cerebral infarction due to embolism of right posterior cerebral artery: Secondary | ICD-10-CM | POA: Diagnosis not present

## 2020-09-13 DIAGNOSIS — M7989 Other specified soft tissue disorders: Secondary | ICD-10-CM | POA: Diagnosis present

## 2020-09-13 DIAGNOSIS — R4182 Altered mental status, unspecified: Secondary | ICD-10-CM | POA: Diagnosis not present

## 2020-09-13 DIAGNOSIS — Z791 Long term (current) use of non-steroidal anti-inflammatories (NSAID): Secondary | ICD-10-CM

## 2020-09-13 LAB — RESP PANEL BY RT-PCR (FLU A&B, COVID) ARPGX2
Influenza A by PCR: NEGATIVE
Influenza B by PCR: NEGATIVE
SARS Coronavirus 2 by RT PCR: NEGATIVE

## 2020-09-13 LAB — RAPID URINE DRUG SCREEN, HOSP PERFORMED
Amphetamines: NOT DETECTED
Barbiturates: NOT DETECTED
Benzodiazepines: NOT DETECTED
Cocaine: NOT DETECTED
Opiates: NOT DETECTED
Tetrahydrocannabinol: NOT DETECTED

## 2020-09-13 LAB — COMPREHENSIVE METABOLIC PANEL
ALT: 1200 U/L — ABNORMAL HIGH (ref 0–44)
AST: 824 U/L — ABNORMAL HIGH (ref 15–41)
Albumin: 2.2 g/dL — ABNORMAL LOW (ref 3.5–5.0)
Alkaline Phosphatase: 933 U/L — ABNORMAL HIGH (ref 38–126)
Anion gap: 9 (ref 5–15)
BUN: 117 mg/dL — ABNORMAL HIGH (ref 6–20)
CO2: 19 mmol/L — ABNORMAL LOW (ref 22–32)
Calcium: 7.8 mg/dL — ABNORMAL LOW (ref 8.9–10.3)
Chloride: 104 mmol/L (ref 98–111)
Creatinine, Ser: 2.26 mg/dL — ABNORMAL HIGH (ref 0.61–1.24)
GFR, Estimated: 34 mL/min — ABNORMAL LOW (ref >60)
Glucose, Bld: 109 mg/dL — ABNORMAL HIGH (ref 70–99)
Potassium: 6.7 mmol/L (ref 3.5–5.1)
Sodium: 132 mmol/L — ABNORMAL LOW (ref 135–145)
Total Bilirubin: 7 mg/dL — ABNORMAL HIGH (ref 0.3–1.2)
Total Protein: 5.4 g/dL — ABNORMAL LOW (ref 6.5–8.1)

## 2020-09-13 LAB — URINALYSIS, ROUTINE W REFLEX MICROSCOPIC
Bilirubin Urine: NEGATIVE
Glucose, UA: NEGATIVE mg/dL
Hgb urine dipstick: NEGATIVE
Ketones, ur: NEGATIVE mg/dL
Leukocytes,Ua: NEGATIVE
Nitrite: NEGATIVE
Protein, ur: NEGATIVE mg/dL
Specific Gravity, Urine: 1.019 (ref 1.005–1.030)
pH: 5 (ref 5.0–8.0)

## 2020-09-13 LAB — I-STAT VENOUS BLOOD GAS, ED
Acid-base deficit: 2 mmol/L (ref 0.0–2.0)
Acid-base deficit: 4 mmol/L — ABNORMAL HIGH (ref 0.0–2.0)
Bicarbonate: 22 mmol/L (ref 20.0–28.0)
Bicarbonate: 21.3 mmol/L (ref 20.0–28.0)
Calcium, Ion: 1.02 mmol/L — ABNORMAL LOW (ref 1.15–1.40)
Calcium, Ion: 0.97 mmol/L — ABNORMAL LOW (ref 1.15–1.40)
HCT: 49 % (ref 39.0–52.0)
HCT: 41 % (ref 39.0–52.0)
Hemoglobin: 16.7 g/dL (ref 13.0–17.0)
Hemoglobin: 13.9 g/dL (ref 13.0–17.0)
O2 Saturation: 65 %
O2 Saturation: 100 %
Potassium: 7.2 mmol/L (ref 3.5–5.1)
Potassium: 5.9 mmol/L — ABNORMAL HIGH (ref 3.5–5.1)
Sodium: 134 mmol/L — ABNORMAL LOW (ref 135–145)
Sodium: 135 mmol/L (ref 135–145)
TCO2: 23 mmol/L (ref 22–32)
TCO2: 22 mmol/L (ref 22–32)
pCO2, Ven: 35.7 mmHg — ABNORMAL LOW (ref 44.0–60.0)
pCO2, Ven: 37.3 mmHg — ABNORMAL LOW (ref 44.0–60.0)
pH, Ven: 7.398 (ref 7.250–7.430)
pH, Ven: 7.365 (ref 7.250–7.430)
pO2, Ven: 34 mmHg (ref 32.0–45.0)
pO2, Ven: 195 mmHg — ABNORMAL HIGH (ref 32.0–45.0)

## 2020-09-13 LAB — AMMONIA: Ammonia: 65 umol/L — ABNORMAL HIGH (ref 9–35)

## 2020-09-13 LAB — I-STAT ARTERIAL BLOOD GAS, ED
Acid-base deficit: 3 mmol/L — ABNORMAL HIGH (ref 0.0–2.0)
Bicarbonate: 19.8 mmol/L — ABNORMAL LOW (ref 20.0–28.0)
Calcium, Ion: 1.08 mmol/L — ABNORMAL LOW (ref 1.15–1.40)
HCT: 45 % (ref 39.0–52.0)
Hemoglobin: 15.3 g/dL (ref 13.0–17.0)
O2 Saturation: 87 %
Patient temperature: 98.6
Potassium: 6 mmol/L — ABNORMAL HIGH (ref 3.5–5.1)
Sodium: 135 mmol/L (ref 135–145)
TCO2: 21 mmol/L — ABNORMAL LOW (ref 22–32)
pCO2 arterial: 29.3 mmHg — ABNORMAL LOW (ref 32.0–48.0)
pH, Arterial: 7.437 (ref 7.350–7.450)
pO2, Arterial: 50 mmHg — ABNORMAL LOW (ref 83.0–108.0)

## 2020-09-13 LAB — CBC WITH DIFFERENTIAL/PLATELET
Abs Immature Granulocytes: 0.14 10*3/uL — ABNORMAL HIGH (ref 0.00–0.07)
Basophils Absolute: 0 10*3/uL (ref 0.0–0.1)
Basophils Relative: 0 %
Eosinophils Absolute: 0 10*3/uL (ref 0.0–0.5)
Eosinophils Relative: 0 %
HCT: 40.3 % (ref 39.0–52.0)
Hemoglobin: 14.2 g/dL (ref 13.0–17.0)
Immature Granulocytes: 1 %
Lymphocytes Relative: 3 %
Lymphs Abs: 0.6 10*3/uL — ABNORMAL LOW (ref 0.7–4.0)
MCH: 29.5 pg (ref 26.0–34.0)
MCHC: 35.2 g/dL (ref 30.0–36.0)
MCV: 83.6 fL (ref 80.0–100.0)
Monocytes Absolute: 1.5 10*3/uL — ABNORMAL HIGH (ref 0.1–1.0)
Monocytes Relative: 8 %
Neutro Abs: 16.2 10*3/uL — ABNORMAL HIGH (ref 1.7–7.7)
Neutrophils Relative %: 88 %
Platelets: 121 10*3/uL — ABNORMAL LOW (ref 150–400)
RBC: 4.82 MIL/uL (ref 4.22–5.81)
RDW: 16.6 % — ABNORMAL HIGH (ref 11.5–15.5)
WBC: 18.4 10*3/uL — ABNORMAL HIGH (ref 4.0–10.5)
nRBC: 0.4 % — ABNORMAL HIGH (ref 0.0–0.2)

## 2020-09-13 LAB — SALICYLATE LEVEL: Salicylate Lvl: <7 mg/dL — ABNORMAL LOW (ref 7.0–30.0)

## 2020-09-13 LAB — TROPONIN I (HIGH SENSITIVITY): Troponin I (High Sensitivity): 106 ng/L (ref <18)

## 2020-09-13 LAB — MAGNESIUM: Magnesium: 2.8 mg/dL — ABNORMAL HIGH (ref 1.7–2.4)

## 2020-09-13 LAB — GLUCOSE, CAPILLARY: Glucose-Capillary: 115 mg/dL — ABNORMAL HIGH (ref 70–99)

## 2020-09-13 LAB — CORTISOL: Cortisol, Plasma: 32.1 ug/dL

## 2020-09-13 LAB — ETHANOL: Alcohol, Ethyl (B): <10 mg/dL (ref <10)

## 2020-09-13 LAB — VITAMIN B12: Vitamin B-12: 7102 pg/mL — ABNORMAL HIGH (ref 180–914)

## 2020-09-13 LAB — CBG MONITORING, ED: Glucose-Capillary: 113 mg/dL — ABNORMAL HIGH (ref 70–99)

## 2020-09-13 LAB — LACTIC ACID, PLASMA
Lactic Acid, Venous: 2.2 mmol/L (ref 0.5–1.9)
Lactic Acid, Venous: 2.7 mmol/L (ref 0.5–1.9)

## 2020-09-13 LAB — BRAIN NATRIURETIC PEPTIDE: B Natriuretic Peptide: 1447.9 pg/mL — ABNORMAL HIGH (ref 0.0–100.0)

## 2020-09-13 LAB — ACETAMINOPHEN LEVEL: Acetaminophen (Tylenol), Serum: <10 ug/mL — ABNORMAL LOW (ref 10–30)

## 2020-09-13 LAB — PHOSPHORUS: Phosphorus: 4.2 mg/dL (ref 2.5–4.6)

## 2020-09-13 LAB — TSH: TSH: 2.611 u[IU]/mL (ref 0.350–4.500)

## 2020-09-13 LAB — LIPASE, BLOOD: Lipase: 142 U/L — ABNORMAL HIGH (ref 11–51)

## 2020-09-13 MED ORDER — LACTATED RINGERS IV BOLUS (SEPSIS)
1000.0000 mL | Freq: Once | INTRAVENOUS | Status: AC
  Administered 2020-09-13: 1000 mL via INTRAVENOUS

## 2020-09-13 MED ORDER — SODIUM CHLORIDE 0.9 % IV SOLN
500.0000 mg | INTRAVENOUS | Status: DC
  Administered 2020-09-14: 500 mg via INTRAVENOUS
  Filled 2020-09-13 (×2): qty 500

## 2020-09-13 MED ORDER — DEXTROSE 50 % IV SOLN
1.0000 | Freq: Once | INTRAVENOUS | Status: AC
  Administered 2020-09-13: 50 mL via INTRAVENOUS
  Filled 2020-09-13: qty 50

## 2020-09-13 MED ORDER — LACTATED RINGERS IV BOLUS (SEPSIS): 1000.0000 mL | Freq: Once | INTRAVENOUS | Status: DC

## 2020-09-13 MED ORDER — SODIUM BICARBONATE 8.4 % IV SOLN
Freq: Once | INTRAVENOUS | Status: AC
  Filled 2020-09-13: qty 1000

## 2020-09-13 MED ORDER — LACTULOSE 10 GM/15ML PO SOLN
30.0000 g | ORAL | Status: DC
  Administered 2020-09-14 (×2): 30 g via ORAL
  Filled 2020-09-13 (×6): qty 45

## 2020-09-13 MED ORDER — LACTATED RINGERS IV SOLN: INTRAVENOUS | Status: DC

## 2020-09-13 MED ORDER — FUROSEMIDE 10 MG/ML IJ SOLN
80.0000 mg | Freq: Two times a day (BID) | INTRAMUSCULAR | Status: DC
  Administered 2020-09-14 (×2): 80 mg via INTRAVENOUS
  Filled 2020-09-13: qty 8

## 2020-09-13 MED ORDER — SODIUM CHLORIDE 0.9 % IV SOLN
3.0000 g | Freq: Three times a day (TID) | INTRAVENOUS | Status: DC
  Administered 2020-09-14 – 2020-09-15 (×5): 3 g via INTRAVENOUS
  Filled 2020-09-13 (×9): qty 8

## 2020-09-13 MED ORDER — SODIUM CHLORIDE 0.9 % IV BOLUS
500.0000 mL | Freq: Once | INTRAVENOUS | Status: AC
  Administered 2020-09-13: 500 mL via INTRAVENOUS

## 2020-09-13 MED ORDER — SODIUM CHLORIDE 0.9 % IV SOLN
2.0000 g | Freq: Once | INTRAVENOUS | Status: AC
  Administered 2020-09-13: 2 g via INTRAVENOUS
  Filled 2020-09-13: qty 20

## 2020-09-13 MED ORDER — METRONIDAZOLE IN NACL 5-0.79 MG/ML-% IV SOLN
500.0000 mg | Freq: Once | INTRAVENOUS | Status: AC
  Administered 2020-09-13: 500 mg via INTRAVENOUS
  Filled 2020-09-13: qty 100

## 2020-09-13 MED ORDER — INSULIN ASPART 100 UNIT/ML IV SOLN
5.0000 [IU] | Freq: Once | INTRAVENOUS | Status: AC
  Administered 2020-09-13: 5 [IU] via INTRAVENOUS

## 2020-09-13 MED ORDER — DOCUSATE SODIUM 100 MG PO CAPS: 100.0000 mg | ORAL_CAPSULE | Freq: Two times a day (BID) | ORAL | Status: DC | PRN

## 2020-09-13 MED ORDER — HEPARIN SODIUM (PORCINE) 5000 UNIT/ML IJ SOLN: 5000.0000 [IU] | Freq: Three times a day (TID) | INTRAMUSCULAR | Status: DC

## 2020-09-13 NOTE — ED Notes (Signed)
Dr. Pecola Leisure came to bedside and assessed patient. New orders received. Lactated Ringers stopped at this time.

## 2020-09-13 NOTE — ED Triage Notes (Signed)
Patient BIB [facility] for evaluation of bilateral lower extremity edema. Patient arouses to voice, history of substance abuse. Patient maintaining SpO2 of 100% on room air and is easily aroused. Patient alert, oriented, and in no apparent distress at this time.

## 2020-09-13 NOTE — Progress Notes (Signed)
Patient up from ED. Report from [facility]. Patient alert and oriented x3 at time of arrival to ICU. ED nurse stated patient became more oriented while in ED. NG tube not yet placed and lactulose not yet started. Writer contacted [facility] to see if Dr. Jerilynn Som wants NG placed and lactulose given. Patient preparing for ABD ultrasound. NG tube to be placed after if still wanting order completed. Patient skin assessment performed with Clinical research associate and Insurance account manager. See flow sheet. Patient placed on tele and oriented to ICU. Safety precautions in place. Vitals stable on arrival.

## 2020-09-13 NOTE — H&P (Addendum)
NAME:  Dennis Thau., MRN:  [id number], DOB:  Dec 11, 1965, LOS: 0 ADMISSION DATE:  09/06/2020, CONSULTATION DATE:  09/07/2020  REFERRING MD: Dina Rich - EM, CHIEF COMPLAINT:  AMS  History of Present Illness:  55 yo man with a past medical history of chronic kidney disease, hypertension who presented to [facility] on 4/15 with CC AMS.  Symptoms started a week ago.  His baseline mental status is alert and oriented to person place time and event.  Mental status changes were described as somnolent and decreased responsiveness.  He has associated lower extremity swelling, weight gain, shortness of breath, and cough. No associated chest pain, fever, syncope.he has never had the symptoms before.  He was recently seen at [facility] emergency department on 08/18/2020 for cough and shortness of breath.  He was diagnosed with pneumonia and discharged home with doxycycline.  He now returns to our hospital with altered mental status and lower extremity swelling which were not present at the time of his emergency department visit.  He works for [employer]  Former tobacco smoker.  Quit years ago.  No alcohol.  Occasional marijuana per prior recordx   History is limited due to altered mental status.   Labs in ED significant for: Na 132, 6.7, CO2 19, BUN 117, 2.26, AG 9, Alk phos 933, AST 824, ALT 1200 Ammonia 65 WBC 18.4  Pertinent  Medical History  CKD III HTN Bronchitis   Objective   Blood pressure (!) 122/95, pulse (!) 102, temperature (!) 97.4 F (36.3 C), temperature source Oral, resp. rate 18, SpO2 99 %.        Intake/Output Summary (Last 24 hours) at 09/28/2020 2009 Last data filed at 09/17/2020 1846 Gross per 24 hour  Intake 500 ml  Output --  Net 500 ml   There were no vitals filed for this visit.  Examination: General: Somnolent but arousable.  Mumbles.  Oriented to place and time. HENT: Scleral icterus. Lungs: Decreased breath sounds.  No tachypnea.  No respiratory distress Cardiovascular: Regular  rate and rhythm. Abdomen: Nondistended.  Mild right upper quadrant tenderness to palpation. Extremities: Bilateral lower extremity edema.  Neuro: Somnolent but arousable.  Answers questions and falls back asleep. GU: Distended urinary bladder seen on ultrasound  Labs/imaging that I havepersonally reviewed  (right click and "Reselect all SmartList Selections" daily)  4/15 CT H- no acute intracranial abnormality  4/15 CT a/p> no focal liver abnormality, no gallstone, no GB wall thickening, no biliary dilatation. Distended bladder. RLL consolidation  Na 132, 6.7, CO2 19, BUN 117, 2.26, AG 9, Alk phos 933, AST 824, ALT 1200 Ammonia 65 WBC 18.4    Labs   CBC: Recent Labs  Lab 09/03/2020 1634 09/12/2020 1839  WBC 18.4*  --   NEUTROABS 16.2*  --   HGB 14.2 16.7  HCT 40.3 49.0  MCV 83.6  --   PLT 121*  --     Basic Metabolic Panel: Recent Labs  Lab 09/09/2020 1634 09/12/2020 1839  NA 132* 134*  K 6.7* 7.2*  CL 104  --   CO2 19*  --   GLUCOSE 109*  --   BUN 117*  --   CREATININE 2.26*  --   CALCIUM 7.8*  --    GFR: CrCl cannot be calculated (Unknown ideal weight.). Recent Labs  Lab 09/01/2020 1634 09/04/2020 1802  WBC 18.4*  --   LATICACIDVEN  --  2.2*    Liver Function Tests: Recent Labs  Lab 09/27/2020 1634  AST  824*  ALT 1,200*  ALKPHOS 933*  BILITOT 7.0*  PROT 5.4*  ALBUMIN 2.2*   No results for input(s): LIPASE, AMYLASE in the last 168 hours. Recent Labs  Lab 09/21/2020 1634  AMMONIA 65*    ABG    Component Value Date/Time   HCO3 22.0 09/11/2020 1839   TCO2 23 09/15/2020 1839   ACIDBASEDEF 2.0 09/14/2020 1839   O2SAT 65.0 08/31/2020 1839     Coagulation Profile: No results for input(s): INR, PROTIME in the last 168 hours.  Cardiac Enzymes: No results for input(s): CKTOTAL, CKMB, CKMBINDEX, TROPONINI in the last 168 hours.  HbA1C: No results found for: HGBA1C  CBG: No results for input(s): GLUCAP in the last 168 hours.  Review of Systems:   No  fever,  + Shortness of breath, bilateral lower extremity swelling  Limited due to condition.   Past Medical History:  He,  has a past medical history of Bronchitis, Environmental allergies, and Hypertension.   Surgical History:  History reviewed. No pertinent surgical history.   Social History:   reports that he has quit smoking. He has never used smokeless tobacco. He reports current alcohol use. He reports that he does not use drugs.   Family History:  His family history is not on file.   Allergies No Known Allergies   Home Medications  Prior to Admission medications   Medication Sig Start Date End Date Taking? Authorizing Provider  albuterol (PROVENTIL HFA;VENTOLIN HFA) 108 (90 BASE) MCG/ACT inhaler Inhale 2 puffs into the lungs every 4 (four) hours as needed for wheezing or shortness of breath. 03/02/12  Yes Hongalgi, Lenis Dickinson, MD  atorvastatin (LIPITOR) 20 MG tablet Take 20 mg by mouth daily.   Yes [provider]  cyclobenzaprine (FLEXERIL) 10 MG tablet Take 10 mg by mouth 3 (three) times daily. 09/04/20  Yes [provider]  fish oil-omega-3 fatty acids 1000 MG capsule Take 1 g by mouth daily.   Yes [provider]  metoCLOPramide (REGLAN) 10 MG tablet Take 10 mg by mouth 4 (four) times daily. 09/03/20  Yes [provider]  Multiple Vitamins-Minerals (MULTIVITAMIN WITH MINERALS) tablet Take 1 tablet by mouth daily.   Yes [provider]  naproxen (NAPROSYN) 500 MG tablet Take 500 mg by mouth 2 (two) times daily. 08/31/20  Yes [provider]  predniSONE (DELTASONE) 10 MG tablet Take 10 mg by mouth 3 (three) times daily. 09/03/20 09/14/20 Yes [provider]  amLODipine (NORVASC) 10 MG tablet Take 1 tablet (10 mg total) by mouth daily. Patient not taking: Reported on 09/23/2020 03/02/12   Modena Jansky, MD  lisinopril (PRINIVIL,ZESTRIL) 20 MG tablet Take 1 tablet (20 mg total) by mouth daily. Patient not taking: Reported on  09/05/2020 03/02/12   Modena Jansky, MD  phenylephrine (SUDAFED PE) 10 MG TABS tablet Take 10 mg by mouth every 4 (four) hours as needed (for congestion).    [provider]  predniSONE (DELTASONE) 20 MG tablet Take 3 tablets (60 mg total) by mouth daily. Patient not taking: No sig reported 09/12/12   Truddie Hidden, MD    Assessment & Plan:  1. Acute Heart failure syndrome 2. Acute liver failure, likely due to Congestive hepatopathy 3. Hepatic encephalopathy 4. AKI on CKD III with hyperkalemia  5. Community acquired pneumonia  6. NSTEMI  7. Lactic acidosis   he is encephalopathic, fluid overloaded, and has new onset liver failure, and heart failure. He is not in shock. Marland Kitchen  Point-of-care cardiac ultrasound showed severely reduced LVEF.  Last known echo showed normal LVEF in 2013. I Suspect his liver injury is due to congestive hepatopathy.  Biliary obstruction, and cholangitis is also possible along with Budd-Chiari and drug induced injury due to recent use of doxycycline. His Tylenol level is negative.  No indication for NAC at this time.a right upper quadrant ultrasound is pending to evaluate for hepatic and portal vein flow, and CBD. Hepatitis panel pending.  Troponin is elevated.  Degree of elevation is unlikely to be due to type II MI. Start therapeutic heparin infusion for NSTEMI.  No STEMI on ECG.  Cardiology consult will need ischemic evaluation at some point.  Formal echo is also pending. For his encephalopathy, place NG tube.  Lactulose 4 times daily.  Goal is 2-3 bowel movements a day. For AKI,  Hyperkalemia, and fluid overload start Lasix 80 mg IV twice daily.  Placing Foley.  Monitor I's and O's.  Daily weight. Lasix will assist with potassium removal. Repeat K.  He also has a right lower lobe opacity on CT. given the shortness of breath, tachypnea, leukocytosis he may have aspirated.  start empiric antibiotics for community-acquired pneumonia.  We will add anaerobic  coverage until right upper quadrant ultrasound is resulted and ruled out cholangitis given elevation in bilirubin and liver enzymes.  Check urine strep, urine Legionella antigens, respiratory pathogen panel.  Blood cultures x2.   Best practice (right click and "Reselect all SmartList Selections" daily)  Diet:  Npo due to altered mental status  DVT prophylaxis: Heparin 5000 every 8  GI prophylaxis: N/A Glucose control: At goal less than 180 mg/dL Peripheral IV access Foley: 09/16/2020 Mobility: Bed rest Code Status: Full code Disposition: Admit to ICU  Critical care time: 50 minutes

## 2020-09-13 NOTE — Progress Notes (Signed)
ABD US performed at bedside at this time.

## 2020-09-13 NOTE — ED Provider Notes (Signed)
[name] EMERGENCY DEPARTMENT Provider Note   CSN: [id number] Arrival date & time: 01-Oct-2020  1447     History Chief Complaint  Patient presents with  . Leg Swelling    Dennis Howard. is a 55 y.o. male.  HPI   55 year old male with past medical history of HTN, CKD presents the emergency department accompanied by his mom for concern of altered mental status and swelling of his bilateral lower extremities.  History mainly obtained from the mom as the patient is drowsy.  He arouses to his name but does not participate in conversation.  The mom is a poor historian but states that he is a high functioning independent individual.  About 2 weeks ago he came down with pneumonia, she states since he started taking the new medications he has become drowsy and altered and now his bilateral lower extremities are swelling.  She has a bag of his new prescriptions, including that bag is an old prescription from 10/2019 of cyclobenzaprine.  This bottle is almost empty.  She is unsure if he is currently taking this medication.  She denies any other alcohol or drug use.  No recent fever or head injury.  She states his pneumonia symptoms have improved with the antibiotics.  Past Medical History:  Diagnosis Date  . Bronchitis   . Environmental allergies   . Hypertension     Patient Active Problem List   Diagnosis Date Noted  . CKD (chronic kidney disease), stage III (HCC) 03/02/2012  . Bronchitis, acute, with bronchospasm 03/02/2012  . Dyspnea 02/29/2012  . Asthma exacerbation 02/29/2012  . HTN (hypertension), malignant 02/29/2012  . Allergic rhinitis 02/29/2012    History reviewed. No pertinent surgical history.     No family history on file.  Social History   Tobacco Use  . Smoking status: Former Games developer  . Smokeless tobacco: Never Used  Vaping Use  . Vaping Use: Never used  Substance Use Topics  . Alcohol use: Yes    Comment: occ  . Drug use: No    Home  Medications Prior to Admission medications   Medication Sig Start Date End Date Taking? Authorizing Provider  albuterol (PROVENTIL HFA;VENTOLIN HFA) 108 (90 BASE) MCG/ACT inhaler Inhale 2 puffs into the lungs every 4 (four) hours as needed for wheezing or shortness of breath. 03/02/12  Yes Hongalgi, Maximino Greenland, MD  atorvastatin (LIPITOR) 20 MG tablet Take 20 mg by mouth daily.   Yes [provider]  cyclobenzaprine (FLEXERIL) 10 MG tablet Take 10 mg by mouth 3 (three) times daily. 09/04/20  Yes [provider]  metoCLOPramide (REGLAN) 10 MG tablet Take 10 mg by mouth 4 (four) times daily. 09/03/20  Yes [provider]  Multiple Vitamins-Minerals (MULTIVITAMIN WITH MINERALS) tablet Take 1 tablet by mouth daily.   Yes [provider]  naproxen (NAPROSYN) 500 MG tablet Take 500 mg by mouth 2 (two) times daily. 08/31/20  Yes [provider]  predniSONE (DELTASONE) 10 MG tablet Take 10 mg by mouth 3 (three) times daily. 09/03/20 09/14/20 Yes [provider]  amLODipine (NORVASC) 10 MG tablet Take 1 tablet (10 mg total) by mouth daily. Patient not taking: Reported on 10-01-20 03/02/12   Elease Etienne, MD  fish oil-omega-3 fatty acids 1000 MG capsule Take by mouth daily.    [provider]  lisinopril (PRINIVIL,ZESTRIL) 20 MG tablet Take 1 tablet (20 mg total) by mouth daily. 03/02/12   Hongalgi, Maximino Greenland, MD  predniSONE (DELTASONE) 20  MG tablet Take 3 tablets (60 mg total) by mouth daily. Patient not taking: No sig reported 09/12/12   Pollyann Savoy, MD    Allergies    Patient has no known allergies.  Review of Systems   Review of Systems  Unable to perform ROS: Mental status change    Physical Exam Updated Vital Signs BP (!) 128/98   Pulse (!) 102   Temp (!) 97.4 F (36.3 C) (Oral)   Resp (!) 23   SpO2 100%   Physical Exam Vitals and nursing note reviewed.  Constitutional:      Comments: Drowsy, arouses easily to name but does  not converse, quickly falls back asleep  HENT:     Head: Normocephalic.     Mouth/Throat:     Mouth: Mucous membranes are moist.  Eyes:     Pupils: Pupils are equal, round, and reactive to light.  Cardiovascular:     Rate and Rhythm: Normal rate.  Pulmonary:     Effort: Pulmonary effort is normal. No respiratory distress.  Abdominal:     Palpations: Abdomen is soft.     Tenderness: There is no abdominal tenderness.  Musculoskeletal:     Cervical back: No rigidity.     Comments: Edematous bilateral lower extremities with scattered blister formation, some weeping  Skin:    General: Skin is warm.  Neurological:     Comments: Answers to his name and periodically follows commands     ED Results / Procedures / Treatments   Labs (all labs ordered are listed, but only abnormal results are displayed) Labs Reviewed  CBC WITH DIFFERENTIAL/PLATELET  COMPREHENSIVE METABOLIC PANEL  ETHANOL  SALICYLATE LEVEL  ACETAMINOPHEN LEVEL  AMMONIA  URINALYSIS, ROUTINE W REFLEX MICROSCOPIC  RAPID URINE DRUG SCREEN, HOSP PERFORMED    EKG None  Radiology No results found.  Procedures .Critical Care Performed by: Rozelle Logan, DO Authorized by: Rozelle Logan, DO   Critical care provider statement:    Critical care time (minutes):  45   Critical care was necessary to treat or prevent imminent or life-threatening deterioration of the following conditions:  Endocrine crisis, hepatic failure, metabolic crisis, renal failure and sepsis   Critical care was time spent personally by me on the following activities:  Discussions with consultants, evaluation of patient's response to treatment, examination of patient, ordering and performing treatments and interventions, ordering and review of laboratory studies, ordering and review of radiographic studies, pulse oximetry, re-evaluation of patient's condition, obtaining history from patient or surrogate and review of old charts      Medications Ordered in ED Medications  sodium chloride 0.9 % bolus 500 mL (500 mLs Intravenous New Bag/Given Sep 17, 2020 1631)    ED Course  I have reviewed the triage vital signs and the nursing notes.  Pertinent labs & imaging results that were available during my care of the patient were reviewed by me and considered in my medical decision making (see chart for details).    MDM Rules/Calculators/A&P                          55 year old male presents the emergency department accompanied by his mom for concern of altered mental status, drowsiness and bilateral lower extremity swelling.  It appears that his mental status change started about a week ago after he had been taking medications for recently diagnosed pneumonia.  Mom describes the patient is high functioning and independent although he does live with  her currently.  Work-up thus far is concerning for sepsis.  He has an elevated white count with a lactic of 2.2.  Code sepsis was initiated, he had already gotten some IV fluids so we did not do the full 30 cc/KG for that reason.  His chemistry is very concerning with worsening AKI, transaminitis, elevated bilirubin.  Patient was recently treated for pneumonia but the mother reported that his symptoms improved.  CT of the abdomen pelvis shows residual pneumonia in the right lung fields without any acute abdominal finding.  Patient has been given IV antibiotics, ICU team was consulted.  VBG does not show any significant acidosis.  ICU team is admitting the patient for further care.  Patients evaluation and results requires admission for further treatment and care. Patient agrees with admission plan, offers no new complaints and is stable/unchanged at time of admit.  Final Clinical Impression(s) / ED Diagnoses Final diagnoses:  None    Rx / DC Orders ED Discharge Orders    None       Rozelle Logan, DO 09/17/2020 2148

## 2020-09-13 NOTE — Progress Notes (Signed)
Pt being followed by [facility] for sepsis protocol. 

## 2020-09-13 NOTE — ED Notes (Signed)
S/W Dr. Wilkie Aye and she advised to give LR bolus, but to give one liter at a time and see how pt tolerates fluid. Lower extremities are very edematous.

## 2020-09-13 NOTE — Progress Notes (Signed)
[facility] Physician-Brief Progress Note Patient Name: Dennis Howard. DOB: 12-14-1965 MRN: [id number]   Date of Service  09/06/2020  HPI/Events of Note  Patient admitted with pneumonia, altered mental status, Acute liver injury,  CKD stage 3, and bilateral lower extremity edema, work up is in progress.  eICU Interventions  New Patient Evaluation completed.        Thomasene Lot Sid Greener 09/11/2020, 11:50 PM

## 2020-09-13 NOTE — ED Notes (Signed)
Attempted to call report x 1  

## 2020-09-14 ENCOUNTER — Inpatient Hospital Stay (HOSPITAL_COMMUNITY): Payer: BLUE CROSS/BLUE SHIELD

## 2020-09-14 ENCOUNTER — Encounter (HOSPITAL_COMMUNITY): Payer: Self-pay | Admitting: Internal Medicine

## 2020-09-14 DIAGNOSIS — N189 Chronic kidney disease, unspecified: Secondary | ICD-10-CM | POA: Diagnosis not present

## 2020-09-14 DIAGNOSIS — A419 Sepsis, unspecified organism: Secondary | ICD-10-CM

## 2020-09-14 DIAGNOSIS — G9340 Encephalopathy, unspecified: Secondary | ICD-10-CM | POA: Diagnosis not present

## 2020-09-14 DIAGNOSIS — I340 Nonrheumatic mitral (valve) insufficiency: Secondary | ICD-10-CM | POA: Diagnosis not present

## 2020-09-14 DIAGNOSIS — I509 Heart failure, unspecified: Secondary | ICD-10-CM | POA: Diagnosis not present

## 2020-09-14 DIAGNOSIS — I5021 Acute systolic (congestive) heart failure: Secondary | ICD-10-CM

## 2020-09-14 DIAGNOSIS — N179 Acute kidney failure, unspecified: Secondary | ICD-10-CM | POA: Diagnosis not present

## 2020-09-14 LAB — BASIC METABOLIC PANEL
Anion gap: 17 — ABNORMAL HIGH (ref 5–15)
BUN: 107 mg/dL — ABNORMAL HIGH (ref 6–20)
CO2: 19 mmol/L — ABNORMAL LOW (ref 22–32)
Calcium: 7.8 mg/dL — ABNORMAL LOW (ref 8.9–10.3)
Chloride: 105 mmol/L (ref 98–111)
Creatinine, Ser: 2.07 mg/dL — ABNORMAL HIGH (ref 0.61–1.24)
GFR, Estimated: 38 mL/min — ABNORMAL LOW (ref >60)
Glucose, Bld: 135 mg/dL — ABNORMAL HIGH (ref 70–99)
Potassium: 4.8 mmol/L (ref 3.5–5.1)
Sodium: 141 mmol/L (ref 135–145)

## 2020-09-14 LAB — PROTEIN / CREATININE RATIO, URINE
Creatinine, Urine: 11.86 mg/dL
Total Protein, Urine: <6 mg/dL

## 2020-09-14 LAB — CBC
HCT: 39.3 % (ref 39.0–52.0)
HCT: 42.3 % (ref 39.0–52.0)
Hemoglobin: 13.9 g/dL (ref 13.0–17.0)
Hemoglobin: 14.8 g/dL (ref 13.0–17.0)
MCH: 29.2 pg (ref 26.0–34.0)
MCH: 29.4 pg (ref 26.0–34.0)
MCHC: 35.4 g/dL (ref 30.0–36.0)
MCHC: 35 g/dL (ref 30.0–36.0)
MCV: 82.6 fL (ref 80.0–100.0)
MCV: 84.1 fL (ref 80.0–100.0)
Platelets: 95 10*3/uL — ABNORMAL LOW (ref 150–400)
Platelets: 106 10*3/uL — ABNORMAL LOW (ref 150–400)
RBC: 4.76 MIL/uL (ref 4.22–5.81)
RBC: 5.03 MIL/uL (ref 4.22–5.81)
RDW: 16.6 % — ABNORMAL HIGH (ref 11.5–15.5)
RDW: 17.7 % — ABNORMAL HIGH (ref 11.5–15.5)
WBC: 18.7 10*3/uL — ABNORMAL HIGH (ref 4.0–10.5)
WBC: 25.3 10*3/uL — ABNORMAL HIGH (ref 4.0–10.5)
nRBC: 0.3 % — ABNORMAL HIGH (ref 0.0–0.2)
nRBC: 0.2 % (ref 0.0–0.2)

## 2020-09-14 LAB — ECHOCARDIOGRAM COMPLETE
Area-P 1/2: 5.97 cm2
Height: 72 in
Weight: 2910.07 oz

## 2020-09-14 LAB — COMPREHENSIVE METABOLIC PANEL
ALT: 1119 U/L — ABNORMAL HIGH (ref 0–44)
AST: 783 U/L — ABNORMAL HIGH (ref 15–41)
Albumin: 2.2 g/dL — ABNORMAL LOW (ref 3.5–5.0)
Alkaline Phosphatase: 880 U/L — ABNORMAL HIGH (ref 38–126)
Anion gap: 11 (ref 5–15)
BUN: 99 mg/dL — ABNORMAL HIGH (ref 6–20)
CO2: 21 mmol/L — ABNORMAL LOW (ref 22–32)
Calcium: 7.7 mg/dL — ABNORMAL LOW (ref 8.9–10.3)
Chloride: 103 mmol/L (ref 98–111)
Creatinine, Ser: 1.86 mg/dL — ABNORMAL HIGH (ref 0.61–1.24)
GFR, Estimated: 43 mL/min — ABNORMAL LOW (ref >60)
Glucose, Bld: 122 mg/dL — ABNORMAL HIGH (ref 70–99)
Potassium: 5.9 mmol/L — ABNORMAL HIGH (ref 3.5–5.1)
Sodium: 135 mmol/L (ref 135–145)
Total Bilirubin: 5.9 mg/dL — ABNORMAL HIGH (ref 0.3–1.2)
Total Protein: 5.4 g/dL — ABNORMAL LOW (ref 6.5–8.1)

## 2020-09-14 LAB — COOXEMETRY PANEL
Carboxyhemoglobin: 1.3 % (ref 0.5–1.5)
Methemoglobin: 0.9 % (ref 0.0–1.5)
O2 Saturation: 48.6 %
Total hemoglobin: 12.7 g/dL (ref 12.0–16.0)

## 2020-09-14 LAB — GLUCOSE, CAPILLARY
Glucose-Capillary: 113 mg/dL — ABNORMAL HIGH (ref 70–99)
Glucose-Capillary: 108 mg/dL — ABNORMAL HIGH (ref 70–99)
Glucose-Capillary: 120 mg/dL — ABNORMAL HIGH (ref 70–99)
Glucose-Capillary: 122 mg/dL — ABNORMAL HIGH (ref 70–99)
Glucose-Capillary: 136 mg/dL — ABNORMAL HIGH (ref 70–99)
Glucose-Capillary: 132 mg/dL — ABNORMAL HIGH (ref 70–99)

## 2020-09-14 LAB — BLOOD GAS, VENOUS
Acid-base deficit: 1.8 mmol/L (ref 0.0–2.0)
Bicarbonate: 21.7 mmol/L (ref 20.0–28.0)
O2 Saturation: 79.1 %
Patient temperature: 37
pCO2, Ven: 31.9 mmHg — ABNORMAL LOW (ref 44.0–60.0)
pH, Ven: 7.447 — ABNORMAL HIGH (ref 7.250–7.430)
pO2, Ven: 45.9 mmHg — ABNORMAL HIGH (ref 32.0–45.0)

## 2020-09-14 LAB — FOLATE: Folate: 12.7 ng/mL (ref >5.9)

## 2020-09-14 LAB — PROCALCITONIN: Procalcitonin: 3.7 ng/mL

## 2020-09-14 LAB — HEPATITIS PANEL, ACUTE
HCV Ab: NONREACTIVE
Hep A IgM: NONREACTIVE
Hep B C IgM: NONREACTIVE
Hepatitis B Surface Ag: NONREACTIVE

## 2020-09-14 LAB — HEPARIN LEVEL (UNFRACTIONATED)
Heparin Unfractionated: 0.8 IU/mL — ABNORMAL HIGH (ref 0.30–0.70)
Heparin Unfractionated: 0.86 IU/mL — ABNORMAL HIGH (ref 0.30–0.70)

## 2020-09-14 LAB — TROPONIN I (HIGH SENSITIVITY): Troponin I (High Sensitivity): 135 ng/L (ref <18)

## 2020-09-14 LAB — STREP PNEUMONIAE URINARY ANTIGEN: Strep Pneumo Urinary Antigen: NEGATIVE

## 2020-09-14 LAB — MRSA PCR SCREENING: MRSA by PCR: NEGATIVE

## 2020-09-14 LAB — CK
Total CK: 5299 U/L — ABNORMAL HIGH (ref 49–397)
Total CK: 6502 U/L — ABNORMAL HIGH (ref 49–397)

## 2020-09-14 LAB — PROTIME-INR
INR: 1.7 — ABNORMAL HIGH (ref 0.8–1.2)
Prothrombin Time: 19.5 seconds — ABNORMAL HIGH (ref 11.4–15.2)

## 2020-09-14 LAB — MAGNESIUM: Magnesium: 2.8 mg/dL — ABNORMAL HIGH (ref 1.7–2.4)

## 2020-09-14 LAB — PHOSPHORUS: Phosphorus: 4 mg/dL (ref 2.5–4.6)

## 2020-09-14 LAB — HIV ANTIBODY (ROUTINE TESTING W REFLEX): HIV Screen 4th Generation wRfx: NONREACTIVE

## 2020-09-14 LAB — CORTISOL: Cortisol, Plasma: 33 ug/dL

## 2020-09-14 LAB — BRAIN NATRIURETIC PEPTIDE: B Natriuretic Peptide: 1523.7 pg/mL — ABNORMAL HIGH (ref 0.0–100.0)

## 2020-09-14 LAB — FIBRINOGEN: Fibrinogen: 158 mg/dL — ABNORMAL LOW (ref 210–475)

## 2020-09-14 MED ORDER — LACTULOSE 10 GM/15ML PO SOLN
30.0000 g | ORAL | Status: AC
  Administered 2020-09-14 (×2): 30 g

## 2020-09-14 MED ORDER — DOCUSATE SODIUM 50 MG/5ML PO LIQD
100.0000 mg | Freq: Two times a day (BID) | ORAL | Status: DC | PRN
  Administered 2020-09-25 (×2): 100 mg
  Filled 2020-09-14 (×2): qty 10

## 2020-09-14 MED ORDER — "THROMBI-PAD 3""X3"" EX PADS"
1.0000 | MEDICATED_PAD | Freq: Once | CUTANEOUS | Status: AC
  Administered 2020-09-14: 1 via TOPICAL
  Filled 2020-09-14: qty 1

## 2020-09-14 MED ORDER — LACTULOSE 10 GM/15ML PO SOLN
30.0000 g | ORAL | Status: AC
Start: 2020-09-14 — End: 2020-09-14
  Administered 2020-09-14 (×2): 30 g via ORAL

## 2020-09-14 MED ORDER — CHLORHEXIDINE GLUCONATE CLOTH 2 % EX PADS
6.0000 | MEDICATED_PAD | Freq: Every day | CUTANEOUS | Status: DC
  Administered 2020-09-14 – 2020-09-17 (×5): 6 via TOPICAL

## 2020-09-14 MED ORDER — HEPARIN BOLUS VIA INFUSION
4000.0000 [IU] | Freq: Once | INTRAVENOUS | Status: AC
  Administered 2020-09-14: 4000 [IU] via INTRAVENOUS
  Filled 2020-09-14: qty 4000

## 2020-09-14 MED ORDER — DOBUTAMINE IN D5W 4-5 MG/ML-% IV SOLN
7.5000 ug/kg/min | INTRAVENOUS | Status: DC
  Administered 2020-09-14: 2 ug/kg/min via INTRAVENOUS
  Administered 2020-09-17: 5 ug/kg/min via INTRAVENOUS
  Administered 2020-09-18 (×2): 7.5 ug/kg/min via INTRAVENOUS
  Filled 2020-09-14 (×5): qty 250

## 2020-09-14 MED ORDER — ASPIRIN 325 MG PO TABS
325.0000 mg | ORAL_TABLET | Freq: Once | ORAL | Status: AC
  Administered 2020-09-14: 325 mg via NASOGASTRIC
  Filled 2020-09-14: qty 1

## 2020-09-14 MED ORDER — HEPARIN (PORCINE) 25000 UT/250ML-% IV SOLN
900.0000 [IU]/h | INTRAVENOUS | Status: DC
  Administered 2020-09-15: 900 [IU]/h via INTRAVENOUS
  Filled 2020-09-14: qty 250

## 2020-09-14 MED ORDER — HEPARIN (PORCINE) 25000 UT/250ML-% IV SOLN
950.0000 [IU]/h | INTRAVENOUS | Status: DC
  Administered 2020-09-14: 950 [IU]/h via INTRAVENOUS

## 2020-09-14 MED ORDER — HEPARIN (PORCINE) 25000 UT/250ML-% IV SOLN
1050.0000 [IU]/h | INTRAVENOUS | Status: DC
  Administered 2020-09-14: 1150 [IU]/h via INTRAVENOUS
  Filled 2020-09-14 (×2): qty 250

## 2020-09-14 NOTE — Progress Notes (Signed)
eLink Physician-Brief Progress Note Patient Name: Dennis Howard. DOB: 17-Dec-1965 MRN: [id number]   Date of Service  09/14/2020  HPI/Events of Note  Bedside RN asking if patient needs to have NG tube placed for lactulose administration access.  eICU Interventions  Ammonia level 65 and patient is lethargic, okay to place NG tube and start Lactulose.        [name] 09/14/2020, 12:41 AM

## 2020-09-14 NOTE — Progress Notes (Signed)
Patient pulled out NG tube at this time. Had to reinsert. X-ray ordered at this time before giving further lactulose.

## 2020-09-14 NOTE — Progress Notes (Signed)
ANTICOAGULATION CONSULT NOTE - Initial Consult  Pharmacy Consult for Heparin Indication: chest pain/ACS  No Known Allergies  Patient Measurements: Height: 6' (182.9 cm) Weight: 82.7 kg (182 lb 5.1 oz) IBW/kg (Calculated) : 77.6 Heparin Dosing Weight: 82 kg  Vital Signs: Temp: 98.2 F (36.8 C) (04/15 2253) Temp Source: Oral (04/15 2021) BP: 134/84 (04/15 2253) Pulse Rate: 105 (04/15 2253)  Labs: Recent Labs    10-04-20 1634 10-04-2020 1839 10-04-2020 2114 10-04-2020 2209 Oct 04, 2020 2258  HGB 14.2 16.7  --  13.9 15.3  HCT 40.3 49.0  --  41.0 45.0  PLT 121*  --   --   --   --   CREATININE 2.26*  --   --   --   --   TROPONINIHS  --   --  106*  --   --     Estimated Creatinine Clearance: 41.5 mL/min (A) (by C-G formula based on SCr of 2.26 mg/dL (H)).   Medical History: Past Medical History:  Diagnosis Date  . Bronchitis   . Environmental allergies   . Hypertension     Medications:  Medications Prior to Admission  Medication Sig Dispense Refill Last Dose  . albuterol (PROVENTIL HFA;VENTOLIN HFA) 108 (90 BASE) MCG/ACT inhaler Inhale 2 puffs into the lungs every 4 (four) hours as needed for wheezing or shortness of breath. 1 Inhaler 0 unk  . atorvastatin (LIPITOR) 20 MG tablet Take 20 mg by mouth daily.   10-04-2020 at am  . cyclobenzaprine (FLEXERIL) 10 MG tablet Take 10 mg by mouth 3 (three) times daily.   10/04/2020 at am  . fish oil-omega-3 fatty acids 1000 MG capsule Take 1 g by mouth daily.   09/12/2020 at Unknown time  . metoCLOPramide (REGLAN) 10 MG tablet Take 10 mg by mouth 4 (four) times daily.   2020-10-04 at am  . Multiple Vitamins-Minerals (MULTIVITAMIN WITH MINERALS) tablet Take 1 tablet by mouth daily.   unk  . naproxen (NAPROSYN) 500 MG tablet Take 500 mg by mouth 2 (two) times daily.   2020/10/04 at am  . predniSONE (DELTASONE) 10 MG tablet Take 10 mg by mouth 3 (three) times daily.   10-04-20 at am  . amLODipine (NORVASC) 10 MG tablet Take 1 tablet (10 mg  total) by mouth daily. (Patient not taking: Reported on 10-04-2020) 30 tablet 2 Not Taking at Unknown time  . lisinopril (PRINIVIL,ZESTRIL) 20 MG tablet Take 1 tablet (20 mg total) by mouth daily. (Patient not taking: Reported on 10/04/2020) 30 tablet 2 Not Taking at Unknown time  . phenylephrine (SUDAFED PE) 10 MG TABS tablet Take 10 mg by mouth every 4 (four) hours as needed (for congestion).   unk  . predniSONE (DELTASONE) 20 MG tablet Take 3 tablets (60 mg total) by mouth daily. (Patient not taking: No sig reported) 15 tablet 0 Not Taking at Unknown time    Assessment: 55 y.o. M presents with AMS. Pharmacy consulted for heparin for NSTEMI. No AC PTA. H/H ok on admission, plt low at 121.  Goal of Therapy:  Heparin level 0.3-0.7 units/ml Monitor platelets by anticoagulation protocol: Yes   Plan:  Heparin IV bolus 4000 units Heparin gtt at 1150 units/hr Will f/u heparin level in 6 hours Daily heparin level and CBC  Christoper Fabian, PharmD, BCPS Please see amion for complete clinical pharmacist phone list 09/14/2020,12:22 AM

## 2020-09-14 NOTE — Progress Notes (Addendum)
ANTICOAGULATION CONSULT NOTE   Pharmacy Consult for Heparin Indication: LV mural thrombus and NSTEMI  No Known Allergies  Patient Measurements: Height: 6' (182.9 cm) Weight: 82.5 kg (181 lb 14.1 oz) IBW/kg (Calculated) : 77.6 Heparin Dosing Weight: 82 kg  Vital Signs: Temp: 98.42 F (36.9 C) (04/16 1500) Temp Source: Bladder (04/16 1200) BP: 133/94 (04/16 1500) Pulse Rate: 105 (04/16 1500)  Labs: Recent Labs    09/30/2020 1634 Sep 30, 2020 1839 30-Sep-2020 2114 09-30-20 2128 2020/09/30 2209 Sep 30, 2020 2258 09/14/20 0031 09/14/20 0732 09/14/20 0940 09/14/20 1615  HGB 14.2   < >  --   --    < > 15.3 13.9  --  14.8  --   HCT 40.3   < >  --   --    < > 45.0 39.3  --  42.3  --   PLT 121*  --   --   --   --   --  95*  --  106*  --   LABPROT  --   --   --  19.5*  --   --   --   --   --   --   INR  --   --   --  1.7*  --   --   --   --   --   --   HEPARINUNFRC  --   --   --   --   --   --   --  0.80*  --  0.86*  CREATININE 2.26*  --   --   --   --   --  1.86*  --  2.07*  --   CKTOTAL  --   --   --   --   --   --  5,299*  --  6,502*  --   TROPONINIHS  --   --  106*  --   --   --   --   --  135*  --    < > = values in this interval not displayed.    Estimated Creatinine Clearance: 45.3 mL/min (A) (by C-G formula based on SCr of 2.07 mg/dL (H)).  Assessment: 55 y.o. M presents with AMS. Pharmacy consulted for heparin for NSTEMI. No AC PTA. H/H ok on admission, plt low at 121.  Heparin level still high at 0.86 after decreasing rate to 1050 units/hr. Spoke with RN, heparin is running in L forearm, HL was drawn from PICC line on right side. Patient had some oozing from PIV removal but is controlled. H/H and platelets stable. INR elevated at 1.7.    Goal of Therapy:  Heparin level 0.3-0.7 units/ml Monitor platelets by anticoagulation protocol: Yes   Plan:  Hold heparin for 30 min and reduce to 950 units/hr Will f/u heparin level in 6 hours Monitor daily HL, CBC/plt Monitor for  signs/symptoms of bleeding   ADDENDUM 22:15: RN called saying the oozing from previous PIV site increased since last discussion at 1800. Patient is bleeding enough to saturate multiple dressings. Will empirically hold heparin for another 30 minutes and decrease rate to 900 units/hr. F/u level at 3am even though will not be 6 hours from this rate change given active bleeding.   Alphia Moh, PharmD, BCPS, BCCP Clinical Pharmacist  Please check AMION for all [facility] Pharmacy phone numbers After 10:00 PM, call Main Pharmacy [phone]

## 2020-09-14 NOTE — Progress Notes (Signed)
EKG performed at beside with Dr. Pecola Leisure to read at this time.

## 2020-09-14 NOTE — Progress Notes (Signed)
  Echocardiogram 2D Echocardiogram has been performed.  Dennis Howard F 09/14/2020, 9:58 AM

## 2020-09-14 NOTE — Progress Notes (Signed)
NG verified at abd junction, recommended pushing in 5 cm. Writer performed recommendation prior to giving lactulose.

## 2020-09-14 NOTE — Progress Notes (Signed)
NAME:  Dennis Gremillion., MRN:  [id number], DOB:  [birth year], LOS: 1 ADMISSION DATE:  09/12/2020, CONSULTATION DATE: 09/15/2020 REFERRING MD: Wilkie Aye - EM CHIEF COMPLAINT:  Altered Mental Status  History of Present Illness:  55 yo man with a past medical history of chronic kidney disease, hypertension who presented to [facility] on 4/15 with CC AMS.  Symptoms started a week ago.  His baseline mental status is alert and oriented to person place time and event.  Mental status changes were described as somnolent and decreased responsiveness.  He has associated lower extremity swelling, weight gain, shortness of breath, and cough. No associated chest pain, fever, syncope. He has never had these symptoms before.  He was recently seen at [facility] emergency department on 08/18/2020 for cough and shortness of breath.  He was diagnosed with pneumonia and discharged home with doxycycline.  He now returns to our hospital with altered mental status and lower extremity swelling which were not present at the time of his emergency department visit.  He works for [employer].  Former tobacco smoker.  Quit years ago.  No alcohol.  Occasional marijuana per prior records.   History is limited due to altered mental status.   Pertinent  Medical History  CKD III HTN Bronchitis  Significant Hospital Events: Including procedures, antibiotic start and stop dates in addition to other pertinent events   . 4/15 Admitted to ICU  Antibiotics: 4/15 Azithromycin x 1  4/15 Ceftriaxone x 1 4/15 Flagyl x 1  4/15 Unasyn >>  Interim History / Subjective:  Patient complains of severe fatigue. He is slow to answer questions but appropriate. No acute complains of pain, nausea, fevers or chills.   Objective   Blood pressure 102/89, pulse (!) 104, temperature 98.24 F (36.8 C), resp. rate (!) 24, height 6' (1.829 m), weight 82.5 kg, SpO2 98 %.        Intake/Output Summary (Last 24 hours) at 09/14/2020 0848 Last data filed at  09/14/2020 0600 Gross per 24 hour  Intake 1008.23 ml  Output 1850 ml  Net -841.77 ml   Filed Weights   09/14/20 0012 09/14/20 0500  Weight: 82.7 kg 82.5 kg    Examination: General: middle aged man, no acute distress, somnolent HENT: [place]/AT, moist mucous membranes, PERRL Lungs: diminished breath sounds. No wheezes or rhonchi. Cardiovascular: tachycardic. No murmurs, rubs or gallops. +JVD Abdomen: distended, soft, non-tender. BS+ Extremities: bullous lesion on left shin, petchial rash on right shin. Cool to touch. 1+ edema Neuro: alert and oriented x 3, somnolent. Moving all extremities. GU: no foley in place.  Labs/imaging that I have personally reviewed  (right click and "Reselect all SmartList Selections" daily)  BMP: K 5.9, Cr 1.86, Alk phos 880, AST/ALT 783/1119, T. Bili 5.9 CBC: WBC 18.7, Plts 95 TSH 2.6 Salicylate <7 Alcohol <10 Utox - negative Acetaminophen <10  CXR - patchy airspace opacities at right base and mid lung and minimal opacity in left lung. [place] Abd - biliary sludge, no biliary duct dilation, no evidence of acute cholecystitis. Heterogenous liver.   Ct Head - unremarkable CT Abdomen pelvis - no acute intra-abdominal process   Echo 4/16 - severely reduced EF with mural thrombus of the LV.  Resolved Hospital Problem list     Assessment & Plan:   Acute Biventricular Heart Failure LV Mural Thrombus NSTEMI - Unknown etiology at this time - Cardiology consulted. Will discuss if Heart Failure team needs to be consulted. Awaiting further recommendations on vasopressor support. - Continue  heparin drip - Check ANA and antiphospholipid panel - Lasix BID   Sepsis due to Pneumonia Aspiration Pneumonia - Continue unasyn  Congestive Hepatopathy Dilated Bowel Loops - In setting of acute heart failure - Will place NG tube to low intermittent suction. Less concern for small bowel obstruction as patient has had bowel movement today.   Encephalopathy In  setting of acute heart failure and possible hepatic encephalopathy - Continue lactulose for now  Acute on Chronic Kidney Disease stage IIIA-B Lactic Acidosis - diuresis as above for the heart failure  Thrombocytopenia - In setting of hepatic congestion  - Continue to monitor  Best practice (right click and "Reselect all SmartList Selections" daily)  Diet:  NPO Pain/Anxiety/Delirium protocol (if indicated): No VAP protocol (if indicated): Not indicated DVT prophylaxis: Systemic AC GI prophylaxis: PPI Glucose control:  SSI No Central venous access:  N/A Arterial line:  N/A Foley:  N/A Mobility:  bed rest  PT consulted: N/A Last date of multidisciplinary goals of care discussion - N/A Code Status:  full code Disposition: ICU  Labs   CBC: Recent Labs  Lab 09/05/2020 1634 08/30/2020 1839 09/18/2020 2209 09/08/2020 2258 09/14/20 0031  WBC 18.4*  --   --   --  18.7*  NEUTROABS 16.2*  --   --   --   --   HGB 14.2 16.7 13.9 15.3 13.9  HCT 40.3 49.0 41.0 45.0 39.3  MCV 83.6  --   --   --  82.6  PLT 121*  --   --   --  95*    Basic Metabolic Panel: Recent Labs  Lab 09/01/2020 1634 09/22/2020 1839 09/04/2020 2209 09/03/2020 2258 09/05/2020 2300 09/14/20 0031  NA 132* 134* 135 135  --  135  K 6.7* 7.2* 5.9* 6.0*  --  5.9*  CL 104  --   --   --   --  103  CO2 19*  --   --   --   --  21*  GLUCOSE 109*  --   --   --   --  122*  BUN 117*  --   --   --   --  99*  CREATININE 2.26*  --   --   --   --  1.86*  CALCIUM 7.8*  --   --   --   --  7.7*  MG  --   --   --   --  2.8* 2.8*  PHOS  --   --   --   --  4.2 4.0   GFR: Estimated Creatinine Clearance: 50.4 mL/min (A) (by C-G formula based on SCr of 1.86 mg/dL (H)). Recent Labs  Lab 09/23/2020 1634 09/03/2020 1802 09/15/2020 2114 09/14/20 0031  WBC 18.4*  --   --  18.7*  LATICACIDVEN  --  2.2* 2.7*  --     Liver Function Tests: Recent Labs  Lab 09/16/2020 1634 09/14/20 0031  AST 824* 783*  ALT 1,200* 1,119*  ALKPHOS 933* 880*   BILITOT 7.0* 5.9*  PROT 5.4* 5.4*  ALBUMIN 2.2* 2.2*   Recent Labs  Lab 09/08/2020 2300  LIPASE 142*   Recent Labs  Lab 09/14/2020 1634  AMMONIA 65*    ABG    Component Value Date/Time   PHART 7.437 09/24/2020 2258   PCO2ART 29.3 (L) 09/24/2020 2258   PO2ART 50 (L) 09/08/2020 2258   HCO3 21.7 09/14/2020 0031   TCO2 21 (L) 09/08/2020 2258   ACIDBASEDEF 1.8 09/14/2020 0031  O2SAT 79.1 09/14/2020 0031     Coagulation Profile: Recent Labs  Lab 09/10/2020 2128  INR 1.7*    Cardiac Enzymes: Recent Labs  Lab 09/14/20 0031  CKTOTAL 5,299*    HbA1C: No results found for: HGBA1C  CBG: Recent Labs  Lab 09/27/2020 2042 09/10/2020 2325 09/14/20 0332 09/14/20 0752  GLUCAP 113* 115* 113* 108*      Critical care time: 60 minutes     Freda Jackson, MD [facility] Office: [phone]   See Amion for personal pager PCCM on call pager [phone] until 7pm. Please call Elink 7p-7a. [phone]

## 2020-09-14 NOTE — Progress Notes (Signed)
Coox panel is 48%, we will start dobutamine at 2 mcg/kg/min (as opposed to milrinone given AKI), repeat COOX in AM. CVP would seem inaccurate given his massive fluid overload by exam, I will discuss with nursing.  We will ask HF service to see patient in the AM.     Dina Rich MD

## 2020-09-14 NOTE — Progress Notes (Signed)
ANTICOAGULATION CONSULT NOTE   Pharmacy Consult for Heparin Indication: chest pain/ACS  No Known Allergies  Patient Measurements: Height: 6' (182.9 cm) Weight: 82.5 kg (181 lb 14.1 oz) IBW/kg (Calculated) : 77.6 Heparin Dosing Weight: 82 kg  Vital Signs: Temp: 98.24 F (36.8 C) (04/16 0600) Temp Source: Oral (04/16 0000) BP: 102/89 (04/16 0600) Pulse Rate: 104 (04/16 0600)  Labs: Recent Labs    09/26/2020 1634 09/28/2020 1839 09/20/2020 2114 09/06/2020 2128 09/18/2020 2209 09/10/2020 2258 09/14/20 0031 09/14/20 0732  HGB 14.2   < >  --   --  13.9 15.3 13.9  --   HCT 40.3   < >  --   --  41.0 45.0 39.3  --   PLT 121*  --   --   --   --   --  95*  --   LABPROT  --   --   --  19.5*  --   --   --   --   INR  --   --   --  1.7*  --   --   --   --   HEPARINUNFRC  --   --   --   --   --   --   --  0.80*  CREATININE 2.26*  --   --   --   --   --  1.86*  --   CKTOTAL  --   --   --   --   --   --  5,299*  --   TROPONINIHS  --   --  106*  --   --   --   --   --    < > = values in this interval not displayed.    Estimated Creatinine Clearance: 50.4 mL/min (A) (by C-G formula based on SCr of 1.86 mg/dL (H)).  Assessment: Dennis Howard presents with AMS. Pharmacy consulted for heparin for NSTEMI. No AC PTA. H/H ok on admission, plt low at 121.  Initial Heparin level is slightly high at 0.8 after bolus and rate of 1150 units/hr. LFTs elevated-trending down. H/H are within normal limits. Platelets down at 95. INR elevated at 1.7. No bleeding noted.   Goal of Therapy:  Heparin level 0.3-0.7 units/ml Monitor platelets by anticoagulation protocol: Yes   Plan:  Reduce IV Heparin gtt to 1050 units/hr Will f/u heparin level in 6 hours Daily heparin level and CBC  Link Snuffer, PharmD, BCPS, BCCCP Clinical Pharmacist Please refer to [facility] for [facility] Pharmacy numbers 09/14/2020,9:17 AM

## 2020-09-14 NOTE — Progress Notes (Signed)
Will place NG tube and start lactulose as patient is very lethargic and last ammonia was 65

## 2020-09-14 NOTE — Consult Note (Addendum)
Cardiology Consultation:   Patient ID: Dennis Howard. MRN: [id number]; DOB: 11-25-1965  Admit date: 09/03/2020 Date of Consult: 09/14/2020  PCP:  Lin Landsman, [place]  Cardiologist:  New to [place] Provider:  No care team member to display Electrophysiologist:  None   Patient Profile:   Dennis Howard. is a 55 y.o. male with a hx of HTN, former tobacco use, CKD stage III by labs who is being seen today for the evaluation of suspected CHF at the request of Dr. Ayesha Rumpf.  History of Present Illness:   Dennis Howard is encephalopathic with some slowed mentation but is alert and oriented times 3 presently when given time to respond. He denies any prior cardiac history, only reports h/o HTN. Remote echo 2013 showed normal LVEF and otherwise unrevealing except for possible changes of hypovolemia. Per review of Epic, also has history of CKD stage III by labs (Cr 1.7 in 2015) and former tobacco use. Denies alcohol or drug use. He has been having increasing SOB recently and was seen in the ED 08/26/20 for cough and dyspnea. CT angio showed no PE, + multifocal PNA with trace-small right pleural effusion, 53m nodular density at left base (f/u recommended 3-6 months), bilateral hilar lymphadenopathy felt reactive, and enlarged LV. He was felt stable for discharge home with doxycycline.  He returned back to the emergency department 09/08/2020 with persistent shortness of breath, altered mental status and marked lower extremity swelling that were not noted to be present at time of original ED visit. Per his report, the swelling and orthopnea started about 4 days ago. He recalls at some point he had chest pain but cannot give any further details - not currently feeling any pain. Denies recent fevers, chills, nausea, vomiting or sick contacts. He is noted to have multiple significant lab abnormalities including hyponatremia, AKI with BUN 117/Cr 2.26, liver  failure with alk phos 933, albumin 2.2, AST 824, ALT 1200, hyperkalemia of 6.7, elevated ammonia of 65, CKD 5299, BNP 1523, troponin 106 (x1). UDS, Covid, ETOH, Tylenol panel negative. CT head nonacute. CT abd/pelvis shows progressive consolidation in RLL, patchy infiltrate LLL (previously seen nodular density nearly completely resolved c/w postinflammatory change). Liver doppler pending. [place]abdomen with extensive biliary sludge without acute cholecystitis, increased hepatic echogenicity which can be seen in intrinsic liver disease or steatosis, trace free fluid RUQ. He was started on lactulose and antibiotics were escalated. Per PCCM note, POC ultrasound showed severely reduced LV function. Formal echocardiogram is pending. He was given IV fluids in the ED then transitioned to IV Lasix 861mBID. He was also given 32534mSA and started on IV heparin per pharmacy for possible ACS. EKG abnormal but similar to recent tracing in March. Last K overnight 5.9, Cr 1.86, and declining platelet count to 95.  Past Medical History:  Diagnosis Date  . Bronchitis   . Environmental allergies   . Hypertension     History reviewed. No pertinent surgical history.   Home Medications:  Prior to Admission medications   Medication Sig Start Date End Date Taking? Authorizing Provider  albuterol (PROVENTIL HFA;VENTOLIN HFA) 108 (90 BASE) MCG/ACT inhaler Inhale 2 puffs into the lungs every 4 (four) hours as needed for wheezing or shortness of breath. 03/02/12  Yes Hongalgi, AnaLenis DickinsonD  atorvastatin (LIPITOR) 20 MG tablet Take 20 mg by mouth daily.   Yes [provider]  cyclobenzaprine (FLEXERIL) 10 MG tablet Take 10 mg by  mouth 3 (three) times daily. 09/04/20  Yes [provider]  fish oil-omega-3 fatty acids 1000 MG capsule Take 1 g by mouth daily.   Yes [provider]  metoCLOPramide (REGLAN) 10 MG tablet Take 10 mg by mouth 4 (four) times daily. 09/03/20  Yes [provider]  Multiple  Vitamins-Minerals (MULTIVITAMIN WITH MINERALS) tablet Take 1 tablet by mouth daily.   Yes [provider]  naproxen (NAPROSYN) 500 MG tablet Take 500 mg by mouth 2 (two) times daily. 08/31/20  Yes [provider]  predniSONE (DELTASONE) 10 MG tablet Take 10 mg by mouth 3 (three) times daily. 09/03/20 09/14/20 Yes [provider]  amLODipine (NORVASC) 10 MG tablet Take 1 tablet (10 mg total) by mouth daily. Patient not taking: Reported on 09/04/2020 03/02/12   Modena Jansky, MD  lisinopril (PRINIVIL,ZESTRIL) 20 MG tablet Take 1 tablet (20 mg total) by mouth daily. Patient not taking: Reported on 09/12/2020 03/02/12   Modena Jansky, MD  phenylephrine (SUDAFED PE) 10 MG TABS tablet Take 10 mg by mouth every 4 (four) hours as needed (for congestion).    [provider]  predniSONE (DELTASONE) 20 MG tablet Take 3 tablets (60 mg total) by mouth daily. Patient not taking: No sig reported 09/12/12   Truddie Hidden, MD    Inpatient Medications: Scheduled Meds: . Chlorhexidine Gluconate Cloth  6 each Topical Daily  . furosemide  80 mg Intravenous BID   Continuous Infusions: . ampicillin-sulbactam (UNASYN) IV Stopped (09/14/20 0541)  . azithromycin Stopped (09/14/20 0202)  . heparin 1,150 Units/hr (09/14/20 0600)   PRN Meds: docusate  Allergies:   No Known Allergies  Social History:   Social History   Socioeconomic History  . Marital status: Single    Spouse name: Not on file  . Number of children: Not on file  . Years of education: Not on file  . Highest education level: Not on file  Occupational History  . Not on file  Tobacco Use  . Smoking status: Former Research scientist (life sciences)  . Smokeless tobacco: Never Used  Vaping Use  . Vaping Use: Never used  Substance and Sexual Activity  . Alcohol use: Not Currently  . Drug use: No  . Sexual activity: Not on file  Other Topics Concern  . Not on file  Social History Narrative  . Not on file   Social  Determinants of Health   Financial Resource Strain: Not on file  Food Insecurity: Not on file  Transportation Needs: Not on file  Physical Activity: Not on file  Stress: Not on file  Social Connections: Not on file  Intimate Partner Violence: Not on file    Family History:    Family History  Problem Relation Age of Onset  . CAD Neg Hx      ROS:  Please see the history of present illness. Very limited given lethargy. All other ROS reviewed and negative.     Physical Exam/Data:   Vitals:   09/14/20 0300 09/14/20 0400 09/14/20 0500 09/14/20 0600  BP: 104/87 (!) 134/106 (!) 124/93 102/89  Pulse: (!) 101 (!) 107 (!) 106 (!) 104  Resp: (!) 27 (!) 21 14 (!) 24  Temp: 98.42 F (36.9 C) 97.88 F (36.6 C) 98.42 F (36.9 C) 98.24 F (36.8 C)  TempSrc:      SpO2: 100% 97% 100% 98%  Weight:   82.5 kg   Height:        Intake/Output Summary (Last 24 hours) at  09/14/2020 0844 Last data filed at 09/14/2020 0600 Gross per 24 hour  Intake 1008.23 ml  Output 1850 ml  Net -841.77 ml   Last 3 Weights 09/14/2020 09/14/2020 09/12/2012  Weight (lbs) 181 lb 14.1 oz 182 lb 5.1 oz 195 lb  Weight (kg) 82.5 kg 82.7 kg 88.451 kg     Body mass index is 24.67 kg/m.  General: Ill appearing AAM in no acute distress but sleepy Head: Normocephalic, atraumatic, sclera non-icteric, no xanthomas, nares are without discharge. Neck: Negative for carotid bruits. JVP appears elevated. Lungs: Poor inspiratory effort, coarse BS at bases, no wheezing or rhonchi Heart: Reg rhythm, tachycardic, S1 S2 without murmurs, rubs, or gallops.  Abdomen: Soft, non-tender, non-distended with normoactive bowel sounds. No rebound/guarding. Extremities: No clubbing or cyanosis. Marked bilateral lower extremity edema up to knees/thighs with scattered bullae, nonpunctured. Distal pulses difficult to appreciate due to edema but extremities are warm Neuro: Alert and oriented X 3 but with slowed mentation. Follows commands with  prompting but falls back asleep at times Psych:  Flat affect   EKG:  The EKG was personally reviewed and demonstrates: sinus tachycardia 104bpm right atrial enlargement, possible LVH, left axis deviation, nonspecific diffuse STTW changes with TWI I, avL, V4-V6 - similar to the last few tracings but new compared to 2013  Telemetry:  Telemetry was personally reviewed and demonstrates: NSR/Sinus tach rare PVC  Relevant CV Studies: Pending  Laboratory Data:  High Sensitivity Troponin:   Recent Labs  Lab 09/06/2020 2114  TROPONINIHS 106*     Chemistry Recent Labs  Lab 09/17/2020 1634 09/17/2020 1839 09/04/2020 2209 09/15/2020 2258 09/14/20 0031  NA 132*   < > 135 135 135  K 6.7*   < > 5.9* 6.0* 5.9*  CL 104  --   --   --  103  CO2 19*  --   --   --  21*  GLUCOSE 109*  --   --   --  122*  BUN 117*  --   --   --  99*  CREATININE 2.26*  --   --   --  1.86*  CALCIUM 7.8*  --   --   --  7.7*  GFRNONAA 34*  --   --   --  43*  ANIONGAP 9  --   --   --  11   < > = values in this interval not displayed.    Recent Labs  Lab 09/06/2020 1634 09/14/20 0031  PROT 5.4* 5.4*  ALBUMIN 2.2* 2.2*  AST 824* 783*  ALT 1,200* 1,119*  ALKPHOS 933* 880*  BILITOT 7.0* 5.9*   Hematology Recent Labs  Lab 09/28/2020 1634 09/02/2020 1839 09/14/2020 2209 08/31/2020 2258 09/14/20 0031  WBC 18.4*  --   --   --  18.7*  RBC 4.82  --   --   --  4.76  HGB 14.2   < > 13.9 15.3 13.9  HCT 40.3   < > 41.0 45.0 39.3  MCV 83.6  --   --   --  82.6  MCH 29.5  --   --   --  29.2  MCHC 35.2  --   --   --  35.4  RDW 16.6*  --   --   --  16.6*  PLT 121*  --   --   --  95*   < > = values in this interval not displayed.   BNP Recent Labs  Lab 09/21/2020 2114 09/14/20 0031  BNP 1,447.9* 1,523.7*    DDimer No results for input(s): DDIMER in the last 168 hours.   Radiology/Studies:  CT Abdomen Pelvis Wo Contrast  Result Date: 09/12/2020 CLINICAL DATA:  Abdominal pain and lower extremity edema EXAM: CT ABDOMEN AND  PELVIS WITHOUT CONTRAST TECHNIQUE: Multidetector CT imaging of the abdomen and pelvis was performed following the standard protocol without IV contrast. COMPARISON:  None. FINDINGS: Lower chest: Lung bases show consolidation and infiltrate in the medial right lower lobe, new from the prior exam. Patchy infiltrate is noted in the left lower lobe as well. Previously seen nodular density has nearly completely resolved in the interval from the prior exam. Some nodularity is noted in the lateral costophrenic angle on the right measuring 19 mm in greatest dimension likely postinflammatory in nature given the abrupt onset from the prior exam. Hepatobiliary: No focal liver abnormality is seen. No gallstones, gallbladder wall thickening, or biliary dilatation. Pancreas: Unremarkable. No pancreatic ductal dilatation or surrounding inflammatory changes. Spleen: Normal in size without focal abnormality. Adrenals/Urinary Tract: Adrenal glands are within normal limits. No renal calculi or obstructive changes are seen. Ureters are within normal limits. The bladder is well distended. Stomach/Bowel: The appendix is well visualized and within normal limits. No obstructive or inflammatory changes of the colon are seen. Small bowel and stomach are unremarkable. Vascular/Lymphatic: Aortic atherosclerosis. No enlarged abdominal or pelvic lymph nodes. Reproductive: Prostate is unremarkable. Other: No abdominal wall hernia or abnormality. No abdominopelvic ascites. Musculoskeletal: Bony structures are within normal limits. Mild lower extremity edema is noted about the hips. IMPRESSION: Progressive consolidation in the right lower lobe medially with a somewhat nodular area of consolidation in the lateral right costophrenic angle. These changes are consistent with progressive pneumonia. Patchy somewhat nodular infiltrate is noted in the left lower lobe. Previously seen nodular density has nearly completely resolved consistent with  postinflammatory change. Mild lower extremity edema. Electronically Signed   By: Inez Catalina M.D.   On: 09/07/2020 19:49   CT Head Wo Contrast  Result Date: 09/28/2020 CLINICAL DATA:  Altered mental status EXAM: CT HEAD WITHOUT CONTRAST TECHNIQUE: Contiguous axial images were obtained from the base of the skull through the vertex without intravenous contrast. COMPARISON:  None. FINDINGS: Brain: No acute intracranial abnormality. Specifically, no hemorrhage, hydrocephalus, mass lesion, acute infarction, or significant intracranial injury. Vascular: No hyperdense vessel or unexpected calcification. Skull: No acute calvarial abnormality. Sinuses/Orbits: Mucosal thickening in the maxillary sinuses. Other: None IMPRESSION: No acute intracranial abnormality. Electronically Signed   By: Rolm Baptise M.D.   On: 09/25/2020 19:36   DG Chest Port 1 View  Result Date: 08/30/2020 CLINICAL DATA:  Shortness of breath, leg swelling EXAM: PORTABLE CHEST 1 VIEW COMPARISON:  08/26/2020 FINDINGS: Cardiomegaly. No confluent opacities, effusions or edema. No acute bony abnormality. IMPRESSION: Cardiomegaly.  No active disease. Electronically Signed   By: Rolm Baptise M.D.   On: 09/03/2020 17:10   DG Abd Portable 1V  Result Date: 09/14/2020 CLINICAL DATA:  Transesophageal tube placement EXAM: PORTABLE ABDOMEN - 1 VIEW COMPARISON:  CT 09/08/2020 FINDINGS: Transesophageal tube has been advanced, coiling within the gastric lumen with the tip positioned near the gastric antrum. Persistent air distention of the bowel in the upper abdomen. Enlarged, lobular cardiac silhouette unchanged from prior compatible cardiomegaly seen on comparison CT. Patchy right retrocardiac opacity compatible with airspace disease in the right lower lobe with additional right midlung opacity as well. IMPRESSION: 1. Transesophageal tube has been advanced, coiling within the gastric lumen with the  tip positioned near the gastric antrum. 2. Persistent air  distention of the bowel in the upper abdomen. Correlate for features of obstruction or ileus. 3. Patchy airspace opacities in the right lung base and right midlung, minimal opacity in the left lung. Electronically Signed   By: Lovena Le M.D.   On: 09/14/2020 06:45   DG Abd Portable 1V  Result Date: 09/14/2020 CLINICAL DATA:  NG tube placement EXAM: PORTABLE ABDOMEN - 1 VIEW COMPARISON:  09/14/2020 FINDINGS: NG tube tip and side port project over the stomach. The side port is just past the gastroesophageal junction. Consider advancing 3-5 more cm. IMPRESSION: NG tube tip and side port project over the stomach, with side port just past the gastroesophageal junction. Electronically Signed   By: Ulyses Jarred M.D.   On: 09/14/2020 02:01   DG Abd Portable 1V  Result Date: 09/14/2020 CLINICAL DATA:  NG tube placement EXAM: PORTABLE ABDOMEN - 1 VIEW COMPARISON:  None. FINDINGS: Nasogastric tube appears folded at the side port and redirected superiorly towards the mid thoracic esophagus. Recommend complete retraction and repositioning. Please note, this is not a full radiograph of the chest or abdomen. Lung bases are clear. Multiple air-filled loops of bowel in the upper abdomen appear colonic with a moderate colonic stool burden towards the hepatic flexure as well. IMPRESSION: 1. Nasogastric tube appears folded at the side port and redirected superiorly towards the mid thoracic esophagus. Recommend complete retraction and repositioning. 2. Please note, this is not a full radiograph of the chest or abdomen. 3. Multiple air-filled loops of bowel in the upper abdomen, correlate with abdominal symptoms. Electronically Signed   By: Lovena Le M.D.   On: 09/14/2020 01:29   US Abdomen Limited RUQ (LIVER/GB)  Result Date: 09/14/2020 CLINICAL DATA:  Liver failure EXAM: ULTRASOUND ABDOMEN LIMITED RIGHT UPPER QUADRANT COMPARISON:  CT 09/28/2020 FINDINGS: Gallbladder: Extensive biliary sludge. Unable to position in  decubitus to assess for mobility. No significant gallbladder wall thickening. No pericholecystic fluid. Sonographic Percell Miller sign is reportedly negative. Common bile duct: Diameter: 3.8 mm, nondilated. Much of the common bile duct is difficult to visualize however due to bowel gas. Liver: Some nonspecific heterogeneity of the liver parenchyma with normal to minimally increased echogenicity. Smooth liver surface contour. No focal liver lesion. No intrahepatic biliary ductal dilatation. Portal vein is patent on color Doppler imaging with normal direction of blood flow towards the liver. Other: Trace free fluid. Please note: Exam was performed with portable technique. Patient currently within the ICU, unable to the perform adequate breath hold or reposition in decubitus. IMPRESSION: 1. Extensive biliary sludge. No sonographic evidence of acute cholecystitis or biliary ductal dilatation. 2. Heterogeneous hepatic echogenicity, can be seen in the setting of intrinsic liver disease and/or steatosis. 3. Trace free fluid in the right upper quadrant. Electronically Signed   By: Lovena Le M.D.   On: 09/14/2020 00:31     Assessment and Plan:   1. Acute encephalopathy and acute liver failure with a multitude of lab abnormalities - elevated ammonia and LFTs, hyperkalemia, markedly elevated CK (?rhabdomyolysis), AKI on CKD stage III, hypoalbuminemia, leukocytosis, thrombocytopenia trending downward - hepatitis panel and liver doppler pending - [place] with extensive biliary sludge and increased echogenicity but no focal liver abnormality seen on CT - clinical picture concerning for congestive hepatopathy - will recheck CBC, CK, BMET this AM to trend - otherwise management of labs/lytes per PCCM  2. Acute heart failure with marked volume overload - per PCCM note, POC  bedside US with severe left ventricular failure - LV was enlarged on CT in 07/2020, so chronicity of LV dysfunction not clear - multiorgan dysfunction  noted with multifactorial process - creatinine improving with Lasix, would continue for now - has been maintaining BP and oxygenation at this time - await echocardiogram - spoke with echo team to expedite, Dr. Harl Bowie will plan to follow this AM - addendum: per echo tech, LVEF appears severely depressed with LV thrombus - MD called to bedside, see additional thoughts below.  3. Elevated troponin/abnormal EKG - vague history of chest pain but no further details available from patient - hsTroponin was relatively low at 106 so not clearly an NSTEMI, could be demand ischemia in setting of metabolic stressors (liver failure, PNA, markedly elevated CK) - f/u troponin level this AM trend - remains on IV heparin for now - will review recommendations with MD regarding ongoing use plus possible initiation of ASA - will also f/u CBC given declining platelet count  4. Recent CAP - antibiotics per PCCM  5. H/o HTN - amlodipine, lisinopril listed on home med list but reported to not be taking   Risk Assessment/Risk Scores:     TIMI Risk Score for Unstable Angina or Non-ST Elevation MI:   The patient's TIMI risk score is 1, which indicates a 5% risk of all cause mortality, new or recurrent myocardial infarction or need for urgent revascularization in the next 14 days.  New York Heart Association (NYHA) Functional Class NYHA Class IV   For questions or updates, please contact [facility] Please consult [email or web address] for contact info under    Signed, Charlie Pitter, PA-C  09/14/2020 8:44 AM   Attending note  Patient seen and disucssed with PA Dunn, I agree with her documentation  55 yo male history of HTN, CKD, admitted with AMS and decreased responsiveness. He also was noted to have LEe dema, weight gain, SOB and recent cough. Recent diagnosis of pneumonia 08/18/20   WBC 18.4 Hgb 14.2 Plt 121(down from 294 2 weeks ago) K 6.7 CO2 19 Cr 2.26 BUN 117 AST 824 ALT 1200 Alk phos 933 Tbili 7  lactric acid 2.2-->2.7  Ammonia 65 UDS neg BNP 1447 TSH 2.6 Lipase 142 CK 5299 COVID neg Blood cultures pending UA neg Trop 106-->  CXR cardiomegaly CT head no acute process CT A/P: progressive consolidation RLL consistent with progressive pneumonia   Assessment and plan  1. Severe sepsis - WBC up to 25.3, multiorgan dysfunction including liver, kidneys, cardiac, AMS - likely source pneumonia given imaging, was in hospital last month with pneumonia - blood pressures are normal, has not required pressors - abx per primary team   2. Acute systolic HF - new diagnosis this admission Prelim echo read LVEF 15-20%, diffuse hypokinesis. Mild to mod RV hypokinesis.  - presented with LE edema, orthopnea. BNP 1447  - started on IV lasix 46m bid, neg 2.5 L thus far. Cr trending down with diuresis - would avoid beta blocker until more euvolemic, also concern for possible severely decreaed CO. Avoid ACE/ARB/ARNI/aldactonee given AKI. With coexisiting sepsis I worry about vasodilation with hydral/nitrates, would also hold for now.  - cold extremities, severely volume overloaded. Concern for significantly low cardiac output - will ask ICU team for central access to check CVP and coox.   3. LV thrombus - prelim echo very large apical layered partially mobile thrombus that partially extends along the distal to mid anteroseptal walls - patient is on heparin gtt, would  repeat study limted 48 hours. Hopefully anticoag will be sufficent to resolve thrombus. Though surgical thrombectomy for large mobile clots has been done very poor candidate for foreseeable future.      4. AKI on CKD/Uremia - baseline 1.5 to 1.7 - admit Cr 2.26, BUN 117  5.Elevated liver enzymes/bilirubin Elevated LFTs, bili, ammonia on admission - benign CT A/P  [place] with extensive biliary sludge, no signs of ductal dilatation or cholecytitis, abnormal liver echogeneicity. Patent portal vein  6. Metabolic acidosis - in setting  of renal failure, lactic acidosis, elevated lipase. INR 1.7   7. Mild troponin elevation - in setting of severe sepsis, HF - at this time do not suspect ACS  8. Thrombocytopenia - of note platelets had decreased by admission labs prior to getting heparin - may be secondary to sepsis.   Carlyle Dolly MD

## 2020-09-14 NOTE — Progress Notes (Signed)
eLink Physician-Brief Progress Note Patient Name: Dennis Howard. DOB: Mar 18, 1966 MRN: [id number]   Date of Service  09/14/2020  HPI/Events of Note  CVP recorded by bedside RN as -6, patient is otherwise hemodynamically stable,  Urine out put averaging  90 ml / hour over past 3 hours, patient has severe ischemic cardiomyopathy with EF of 15 - 20 %. Last BNP was approx 24 hours ago and was > 1000.  eICU Interventions  Will add BNP to a.m. labs, no other intervention as long as patient remains hemodynamically stable.        Thomasene Lot Taye Cato 09/14/2020, 10:06 PM

## 2020-09-15 DIAGNOSIS — I5021 Acute systolic (congestive) heart failure: Secondary | ICD-10-CM | POA: Diagnosis not present

## 2020-09-15 LAB — CK TOTAL AND CKMB (NOT AT ARMC)
CK, MB: 14.1 ng/mL — ABNORMAL HIGH (ref 0.5–5.0)
Relative Index: 0.4 (ref 0.0–2.5)
Total CK: 4025 U/L — ABNORMAL HIGH (ref 49–397)

## 2020-09-15 LAB — CBC
HCT: 36 % — ABNORMAL LOW (ref 39.0–52.0)
Hemoglobin: 12.3 g/dL — ABNORMAL LOW (ref 13.0–17.0)
MCH: 29.9 pg (ref 26.0–34.0)
MCHC: 34.2 g/dL (ref 30.0–36.0)
MCV: 87.4 fL (ref 80.0–100.0)
Platelets: 103 10*3/uL — ABNORMAL LOW (ref 150–400)
RBC: 4.12 MIL/uL — ABNORMAL LOW (ref 4.22–5.81)
RDW: 18.1 % — ABNORMAL HIGH (ref 11.5–15.5)
WBC: 25 10*3/uL — ABNORMAL HIGH (ref 4.0–10.5)
nRBC: 0 % (ref 0.0–0.2)

## 2020-09-15 LAB — COMPREHENSIVE METABOLIC PANEL
ALT: 833 U/L — ABNORMAL HIGH (ref 0–44)
AST: 476 U/L — ABNORMAL HIGH (ref 15–41)
Albumin: 1.9 g/dL — ABNORMAL LOW (ref 3.5–5.0)
Alkaline Phosphatase: 689 U/L — ABNORMAL HIGH (ref 38–126)
Anion gap: 8 (ref 5–15)
BUN: 96 mg/dL — ABNORMAL HIGH (ref 6–20)
CO2: 25 mmol/L (ref 22–32)
Calcium: 7.6 mg/dL — ABNORMAL LOW (ref 8.9–10.3)
Chloride: 111 mmol/L (ref 98–111)
Creatinine, Ser: 1.99 mg/dL — ABNORMAL HIGH (ref 0.61–1.24)
GFR, Estimated: 39 mL/min — ABNORMAL LOW (ref >60)
Glucose, Bld: 137 mg/dL — ABNORMAL HIGH (ref 70–99)
Potassium: 4.9 mmol/L (ref 3.5–5.1)
Sodium: 144 mmol/L (ref 135–145)
Total Bilirubin: 4.6 mg/dL — ABNORMAL HIGH (ref 0.3–1.2)
Total Protein: 4.9 g/dL — ABNORMAL LOW (ref 6.5–8.1)

## 2020-09-15 LAB — COOXEMETRY PANEL
Carboxyhemoglobin: 1.3 % (ref 0.5–1.5)
Methemoglobin: 0.7 % (ref 0.0–1.5)
O2 Saturation: 50.7 %
Total hemoglobin: 11.7 g/dL — ABNORMAL LOW (ref 12.0–16.0)

## 2020-09-15 LAB — GLUCOSE, CAPILLARY
Glucose-Capillary: 108 mg/dL — ABNORMAL HIGH (ref 70–99)
Glucose-Capillary: 120 mg/dL — ABNORMAL HIGH (ref 70–99)
Glucose-Capillary: 131 mg/dL — ABNORMAL HIGH (ref 70–99)
Glucose-Capillary: 133 mg/dL — ABNORMAL HIGH (ref 70–99)
Glucose-Capillary: 143 mg/dL — ABNORMAL HIGH (ref 70–99)
Glucose-Capillary: 151 mg/dL — ABNORMAL HIGH (ref 70–99)

## 2020-09-15 LAB — HEPARIN LEVEL (UNFRACTIONATED)
Heparin Unfractionated: 0.42 IU/mL (ref 0.30–0.70)
Heparin Unfractionated: 0.38 IU/mL (ref 0.30–0.70)

## 2020-09-15 LAB — BRAIN NATRIURETIC PEPTIDE: B Natriuretic Peptide: 743.8 pg/mL — ABNORMAL HIGH (ref 0.0–100.0)

## 2020-09-15 LAB — PROCALCITONIN: Procalcitonin: 2.9 ng/mL

## 2020-09-15 MED ORDER — SODIUM CHLORIDE 0.9% FLUSH
10.0000 mL | Freq: Two times a day (BID) | INTRAVENOUS | Status: DC
  Administered 2020-09-15 – 2020-09-19 (×6): 10 mL
  Administered 2020-09-19: 20 mL
  Administered 2020-09-20 – 2020-09-23 (×6): 10 mL
  Administered 2020-09-23: 20 mL
  Administered 2020-09-24: 10 mL
  Administered 2020-09-24: 30 mL
  Administered 2020-09-25 (×2): 20 mL
  Administered 2020-09-26 – 2020-09-27 (×3): 10 mL
  Administered 2020-09-27: 20 mL
  Administered 2020-09-28 – 2020-10-02 (×9): 10 mL

## 2020-09-15 MED ORDER — SODIUM CHLORIDE 0.9% FLUSH
10.0000 mL | INTRAVENOUS | Status: DC | PRN
  Administered 2020-09-23: 50 mL

## 2020-09-15 MED ORDER — SODIUM CHLORIDE 0.9 % IV SOLN
2.0000 g | Freq: Two times a day (BID) | INTRAVENOUS | Status: AC
  Administered 2020-09-15 – 2020-09-22 (×14): 2 g via INTRAVENOUS
  Filled 2020-09-15 (×14): qty 2

## 2020-09-15 MED ORDER — FUROSEMIDE 10 MG/ML IJ SOLN: 40.0000 mg | Freq: Once | INTRAMUSCULAR | Status: DC

## 2020-09-15 MED ORDER — FUROSEMIDE 10 MG/ML IJ SOLN
40.0000 mg | Freq: Once | INTRAMUSCULAR | Status: AC
  Administered 2020-09-15: 40 mg via INTRAVENOUS
  Filled 2020-09-15: qty 4

## 2020-09-15 MED ORDER — FUROSEMIDE 10 MG/ML IJ SOLN
40.0000 mg | Freq: Two times a day (BID) | INTRAMUSCULAR | Status: DC
  Administered 2020-09-16 – 2020-09-18 (×6): 40 mg via INTRAVENOUS
  Filled 2020-09-15 (×7): qty 4

## 2020-09-15 NOTE — Progress Notes (Signed)
ANTICOAGULATION CONSULT NOTE   Pharmacy Consult for Heparin Indication: LV mural thrombus and NSTEMI  Labs: Recent Labs    09/12/2020 1634 09/06/2020 1839 09/21/2020 2114 09/27/2020 2128 09/03/2020 2209 09/14/20 0031 09/14/20 0732 09/14/20 0940 09/14/20 1615 09/15/20 0321  HGB 14.2   < >  --   --    < > 13.9  --  14.8  --  12.3*  HCT 40.3   < >  --   --    < > 39.3  --  42.3  --  36.0*  PLT 121*  --   --   --   --  95*  --  106*  --  103*  LABPROT  --   --   --  19.5*  --   --   --   --   --   --   INR  --   --   --  1.7*  --   --   --   --   --   --   HEPARINUNFRC  --   --   --   --   --   --  0.80*  --  0.86* 0.42  CREATININE 2.26*  --   --   --   --  1.86*  --  2.07*  --   --   CKTOTAL  --   --   --   --   --  5,299*  --  6,502*  --   --   TROPONINIHS  --   --  106*  --   --   --   --  135*  --   --    < > = values in this interval not displayed.   Assessment: 55 y.o. M presents with AMS. Pharmacy consulted for heparin for NSTEMI. No AC PTA. H/H ok on admission, plt low at 121.  Heparin level 0.42 units/ml  Hg 12.3, PTLC 103.  No further oozing noted per RN  Goal of Therapy:  Heparin level 0.3-0.7 units/ml Monitor platelets by anticoagulation protocol: Yes   Plan:  Continue heparin at 900 units/hr Will f/u heparin level in 6 hours to confirm Monitor daily HL, CBC/plt Monitor for signs/symptoms of bleeding   Thanks for allowing pharmacy to be a part of this patient's care.  Talbert Cage, PharmD Clinical Pharmacist

## 2020-09-15 NOTE — Progress Notes (Signed)
NAME:  Merik Mignano., MRN:  [id number], DOB:  06/11/1965, LOS: 2 ADMISSION DATE:  09/06/2020, CONSULTATION DATE: 09/27/2020 REFERRING MD: Wilkie Aye - EM CHIEF COMPLAINT:  Altered Mental Status  History of Present Illness:  55 yo man with a past medical history of chronic kidney disease, hypertension who presented to [facility] on 4/15 with CC AMS.  Symptoms started a week ago.  His baseline mental status is alert and oriented to person place time and event.  Mental status changes were described as somnolent and decreased responsiveness.  He has associated lower extremity swelling, weight gain, shortness of breath, and cough. No associated chest pain, fever, syncope. He has never had these symptoms before.  He was recently seen at [facility] emergency department on 08/18/2020 for cough and shortness of breath.  He was diagnosed with pneumonia and discharged home with doxycycline.  He now returns to our hospital with altered mental status and lower extremity swelling which were not present at the time of his emergency department visit.  He works for [employer].  Former tobacco smoker.  Quit years ago.  No alcohol.  Occasional marijuana per prior records.   History is limited due to altered mental status.   Pertinent  Medical History  CKD III HTN Bronchitis  Significant Hospital Events: Including procedures, antibiotic start and stop dates in addition to other pertinent events   . 4/15 Admitted to ICU  Antibiotics: 4/15 Azithromycin x 1  4/15 Ceftriaxone x 1 4/15 Flagyl x 1  4/15 Unasyn >>  Interim History / Subjective:  No acute events overnight.   Patient started on dobutamine drip yesterday.   He is net negative 2.5L for the past 24 hours.  He continues to feel very fatigued.  Objective   Blood pressure 128/90, pulse (!) 108, temperature 98.06 F (36.7 C), resp. rate (!) 9, height 6' (1.829 m), weight 80.5 kg, SpO2 100 %. CVP:  [2 mmHg-3 mmHg] 2 mmHg      Intake/Output Summary (Last 24  hours) at 09/15/2020 0947 Last data filed at 09/15/2020 0800 Gross per 24 hour  Intake 543.9 ml  Output 3090 ml  Net -2546.1 ml   Filed Weights   09/14/20 0012 09/14/20 0500 09/15/20 0446  Weight: 82.7 kg 82.5 kg 80.5 kg    Examination: General: middle aged man, no acute distress HENT: [place]/AT, moist mucous membranes, PERRL Lungs: diminished breath sounds. No wheezes or rhonchi. Cardiovascular: tachycardic. No murmurs, rubs or gallops. +JVD Abdomen: soft, non-tender. BS+, non-distended Extremities: bullous lesion on left shin, petchial rash on right shin. 2+ edema Neuro: alert and oriented x 3. Moving all extremities. GU: no foley in place  Labs/imaging that I have personally reviewed  (right click and "Reselect all SmartList Selections" daily)  CMP: Cr 1.99, K 4.9, Alk phos 689, AST/ALT 476/833 BNP: 743 CBC: WBC 25, Plts 103  TSH 2.6 Salicylate <7 Alcohol <10 Utox - negative Acetaminophen <10  CXR - patchy airspace opacities at right base and mid lung and minimal opacity in left lung. [place] Abd - biliary sludge, no biliary duct dilation, no evidence of acute cholecystitis. Heterogenous liver.   Ct Head - unremarkable CT Abdomen pelvis - no acute intra-abdominal process   Echo 4/16 - severely reduced EF with mural thrombus of the LV.  Resolved Hospital Problem list     Assessment & Plan:   Acute Biventricular Heart Failure LV Thrombus NSTEMI - Unknown etiology of heart failure at this time. Ischemic vs infectious vs inflammatory - Cardiology  following, started on dobutamine on 4/17. Heart failure team to evaluate patient today. - Continue heparin drip - Check ANA and antiphospholipid panel - Holding lasix for now until further cardiology input. BNP trending down.  - Trend Co-ox panels  Sepsis due to Pneumonia Aspiration Pneumonia - Continue unasyn - repeat procalcitonin  Congestive Hepatopathy Dilated Bowel Loops - LFTs and T. Bili trending down - continue NG  to low intermittent suction  Encephalopathy - improved - In setting of acute heart failure and multiorgan dysfunction  Acute on Chronic Kidney Disease stage IIIA-B Lactic Acidosis - hold diuresis - urine output is appropriate  Thrombocytopenia  - In setting of hepatic congestion  - Continue to monitor  Elevated Creatinine Kinase - Check total CK and CKMB   Best practice (right click and "Reselect all SmartList Selections" daily)  Diet:  NPO Pain/Anxiety/Delirium protocol (if indicated): No VAP protocol (if indicated): Not indicated DVT prophylaxis: Systemic AC GI prophylaxis: PPI Glucose control:  SSI No Central venous access:  N/A Arterial line:  N/A Foley:  N/A Mobility:  bed rest  PT consulted: N/A Last date of multidisciplinary goals of care discussion - N/A Code Status:  full code Disposition: ICU  Labs   CBC: Recent Labs  Lab 09/08/2020 1634 09/15/2020 1839 09/06/2020 2209 09/24/2020 2258 09/14/20 0031 09/14/20 0940 09/15/20 0321  WBC 18.4*  --   --   --  18.7* 25.3* 25.0*  NEUTROABS 16.2*  --   --   --   --   --   --   HGB 14.2   < > 13.9 15.3 13.9 14.8 12.3*  HCT 40.3   < > 41.0 45.0 39.3 42.3 36.0*  MCV 83.6  --   --   --  82.6 84.1 87.4  PLT 121*  --   --   --  95* 106* 103*   < > = values in this interval not displayed.    Basic Metabolic Panel: Recent Labs  Lab 09/10/2020 1634 09/14/2020 1839 09/19/2020 2209 09/27/2020 2258 09/11/2020 2300 09/14/20 0031 09/14/20 0940 09/15/20 0321  NA 132*   < > 135 135  --  135 141 144  K 6.7*   < > 5.9* 6.0*  --  5.9* 4.8 4.9  CL 104  --   --   --   --  103 105 111  CO2 19*  --   --   --   --  21* 19* 25  GLUCOSE 109*  --   --   --   --  122* 135* 137*  BUN 117*  --   --   --   --  99* 107* 96*  CREATININE 2.26*  --   --   --   --  1.86* 2.07* 1.99*  CALCIUM 7.8*  --   --   --   --  7.7* 7.8* 7.6*  MG  --   --   --   --  2.8* 2.8*  --   --   PHOS  --   --   --   --  4.2 4.0  --   --    < > = values in this  interval not displayed.   GFR: Estimated Creatinine Clearance: 47.1 mL/min (A) (by C-G formula based on SCr of 1.99 mg/dL (H)). Recent Labs  Lab 09/03/2020 1634 09/10/2020 1802 09/11/2020 2114 09/14/20 0031 09/14/20 0940 09/15/20 0321  PROCALCITON  --   --   --   --  3.70  --  WBC 18.4*  --   --  18.7* 25.3* 25.0*  LATICACIDVEN  --  2.2* 2.7*  --   --   --     Liver Function Tests: Recent Labs  Lab 09/14/2020 1634 09/14/20 0031 09/15/20 0321  AST 824* 783* 476*  ALT 1,200* 1,119* 833*  ALKPHOS 933* 880* 689*  BILITOT 7.0* 5.9* 4.6*  PROT 5.4* 5.4* 4.9*  ALBUMIN 2.2* 2.2* 1.9*   Recent Labs  Lab 09/14/2020 2300  LIPASE 142*   Recent Labs  Lab 09/27/2020 1634  AMMONIA 65*    ABG    Component Value Date/Time   PHART 7.437 09/08/2020 2258   PCO2ART 29.3 (L) 09/15/2020 2258   PO2ART 50 (L) 09/17/2020 2258   HCO3 21.7 09/14/2020 0031   TCO2 21 (L) 09/26/2020 2258   ACIDBASEDEF 1.8 09/14/2020 0031   O2SAT 50.7 09/15/2020 0445     Coagulation Profile: Recent Labs  Lab 09/03/2020 2128  INR 1.7*    Cardiac Enzymes: Recent Labs  Lab 09/14/20 0031 09/14/20 0940  CKTOTAL 5,299* 6,502*    HbA1C: No results found for: HGBA1C  CBG: Recent Labs  Lab 09/14/20 1559 09/14/20 1922 09/14/20 2328 09/15/20 0333 09/15/20 0802  GLUCAP 122* 136* 132* 131* 120*      Critical care time: 40 minutes     Freda Jackson, MD [facility] Office: [phone]   See Amion for personal pager PCCM on call pager [phone] until 7pm. Please call Elink 7p-7a. [phone]

## 2020-09-15 NOTE — Progress Notes (Signed)
Pharmacy Antibiotic Note  Dennis Howard. is a 55 y.o. male admitted on 09/07/2020 with aspiration pneumonia and sepsis. Pharmacy has been consulted for cefepime dosing. Patient is being switched from Unasyn. WBC persistently 25. Afebrile. PCT 2.9 down trending. ClCr ~47 mlmin.   Plan: Start cefepime 2g Q 12 hr Monitor cultures, clinical status, renal fx Narrow abx as able and f/u duration   Height: 6' (182.9 cm) Weight: 80.5 kg (177 lb 7.5 oz) IBW/kg (Calculated) : 77.6  Temp (24hrs), Avg:98.2 F (36.8 C), Min:97.7 F (36.5 C), Max:98.78 F (37.1 C)  Recent Labs  Lab 09/21/2020 1634 09/07/2020 1802 09/24/2020 2114 09/14/20 0031 09/14/20 0940 09/15/20 0321  WBC 18.4*  --   --  18.7* 25.3* 25.0*  CREATININE 2.26*  --   --  1.86* 2.07* 1.99*  LATICACIDVEN  --  2.2* 2.7*  --   --   --     Estimated Creatinine Clearance: 47.1 mL/min (A) (by C-G formula based on SCr of 1.99 mg/dL (H)).    No Known Allergies  Antimicrobials this admission: Cefepime 4/17 >>  Unasyn 4/16 >> 4/17 CTX 4/15  MTZ 4/15   Microbiology results: 4/15 BCx: ngtd 4/16 BCx ngtd 4/15 MRSA PCR: negative  Thank you for allowing pharmacy to be a part of this patient's care.  Alphia Moh, PharmD, BCPS, BCCP Clinical Pharmacist  Please check AMION for all [facility] Pharmacy phone numbers After 10:00 PM, call Main Pharmacy [phone]

## 2020-09-15 NOTE — Consult Note (Signed)
Advanced Heart Failure Team Consult Note   Primary Physician: Leilani Able, MD PCP-Cardiologist:  No primary care provider on file.  Reason for Consultation: Cardiogenic shock  HPI:    Dennis Lindblad. is seen today for evaluation of cardiogenic shock at the request of Dr. Wyline Mood.   Dennis Howard. is a 55 y.o. male with a hx of HTN, former tobacco use, CKD stage IIIa.  There is no h/o prior known cardiac disease. Echo 2013 showed normal LVEF.Presented to ER on 08/26/20 for progressive SOB and cough.  CT angio showed no PE, + multifocal PNA with trace-small right pleural effusion, 73mm nodular density at left base (f/u recommended 3-6 months), bilateral hilar lymphadenopathy felt reactive, and enlarged LV. He was felt stable for discharge home with doxycycline.  He returned back to the emergency department 09/14/2020 with persistent shortness of breath, intermittent CP, altered mental status and marked lower extremity swelling. Labs with AKI with BUN 117/Cr 2.26, liver failure with alk phos 933, albumin 2.2, AST 824, ALT 1200, hyperkalemia of 6.7, elevated ammonia of 65, BNP 1523, troponin 106 (x1), Total CK 6,502 with CK-MB 14, Lactic acid 2.2, PCT 3.7.  UDS, Covid, ETOH, Tylenol panel negative. CT head nonacute. CT abd/pelvis shows progressive consolidation in RLL, patchy infiltrate LLL (previously seen nodular density nearly completely resolved c/w postinflammatory change). ECG non-acute. Hydrated in ER.   CCM admitted patient. Started on broad spectrum abx for CAP and lactulose for hepatic encephalopathy. IV lasix begun.  Liver u/s:  Some nonspecific heterogeneity of the liver parenchyma with normal to minimally increased echogenicity.  ECHO LVEF 15% with severe global HK with regional variability + LV clot RV moderately down. Seen by Dr. Wyline Mood. Co-ox 48%. Started dobutamine at 2.   Co-ox today 51%. LFTs coming down. Creatinine 2.1 -> 1.9. CVP 5  Awake and alert but slow in  answering. Denies any ETOH or other chronic medical issues. Was working at [employer] Lives at home with his mom   TSH ok, Cortisol ok, Urine protein negative   Review of Systems: [y] = yes, [ ]  = no   . General: Weight gain [ ] ; Weight loss [ ] ; Anorexia ]; Fatigue ]; Fever [ ] ; Chills [ ] ; Weakness [ ]   . Cardiac: Chest pain/pressure ]; Resting SOB [ y]; Exertional SOB Cove.Etienne ]; Orthopnea Cove.Etienne ]; Pedal Edema [ y]; Palpitations [ ] ; Syncope [ ] ; Presyncope [ ] ; Paroxysmal nocturnal dyspnea[y ]  . Pulmonary: Cough ]; Wheezing[ ] ; Hemoptysis[ ] ; Sputum [ ] ; Snoring [ ]   . GI: Vomiting[ ] ; Dysphagia[ ] ; Melena[ ] ; Hematochezia [ ] ; Heartburn[ ] ; Abdominal pain [ ] ; Constipation [ ] ; Diarrhea [ ] ; BRBPR [ ]   . GU: Hematuria[ ] ; Dysuria [ ] ; Nocturia[ ]   . Vascular: Pain in legs with walking [ ] ; Pain in feet with lying flat [ ] ; Non-healing sores [ ] ; Stroke [ ] ; TIA [ ] ; Slurred speech [ ] ;  . Neuro: Headaches[ ] ; Vertigo[ ] ; Seizures[ ] ; Paresthesias[ ] ;Blurred vision [ ] ; Diplopia [ ] ; Vision changes [ ]   . Ortho/Skin: Arthritis [ ] ; Joint pain [ ] ; Muscle pain [ ] ; Joint swelling [ ] ; Back Pain [ ] ; Rash [ ]   . Psych: Depression[ ] ; Anxiety[ ]   . Heme: Bleeding problems [ ] ; Clotting disorders [ ] ; Anemia [ ]   . Endocrine: Diabetes [ ] ; Thyroid dysfunction[ ]   Home Medications Prior to Admission medications   Medication Sig Start  Date End Date Taking? Authorizing Provider  albuterol (PROVENTIL HFA;VENTOLIN HFA) 108 (90 BASE) MCG/ACT inhaler Inhale 2 puffs into the lungs every 4 (four) hours as needed for wheezing or shortness of breath. 03/02/12  Yes Hongalgi, Lenis Dickinson, MD  atorvastatin (LIPITOR) 20 MG tablet Take 20 mg by mouth daily.   Yes [provider]  cyclobenzaprine (FLEXERIL) 10 MG tablet Take 10 mg by mouth 3 (three) times daily. 09/04/20  Yes [provider]  fish oil-omega-3 fatty acids 1000 MG capsule Take 1 g by mouth daily.   Yes [provider]  metoCLOPramide (REGLAN) 10 MG tablet Take 10 mg by mouth 4 (four) times daily. 09/03/20  Yes [provider]  Multiple Vitamins-Minerals (MULTIVITAMIN WITH MINERALS) tablet Take 1 tablet by mouth daily.   Yes [provider]  naproxen (NAPROSYN) 500 MG tablet Take 500 mg by mouth 2 (two) times daily. 08/31/20  Yes [provider]  amLODipine (NORVASC) 10 MG tablet Take 1 tablet (10 mg total) by mouth daily. Patient not taking: Reported on 09/11/2020 03/02/12   Modena Jansky, MD  lisinopril (PRINIVIL,ZESTRIL) 20 MG tablet Take 1 tablet (20 mg total) by mouth daily. Patient not taking: Reported on 09/08/2020 03/02/12   Modena Jansky, MD  phenylephrine (SUDAFED PE) 10 MG TABS tablet Take 10 mg by mouth every 4 (four) hours as needed (for congestion).    [provider]  predniSONE (DELTASONE) 20 MG tablet Take 3 tablets (60 mg total) by mouth daily. Patient not taking: No sig reported 09/12/12   Truddie Hidden, MD    Past Medical History: Past Medical History:  Diagnosis Date  . Bronchitis   . Environmental allergies   . Hypertension     Past Surgical History: History reviewed. No pertinent surgical history.  Family History: Family History  Problem Relation Age of Onset  . CAD Neg Hx     Social History: Social History   Socioeconomic History  . Marital status: Single    Spouse name: Not on file  . Number of children: Not on file  . Years of education: Not on file  . Highest education level: Not on file  Occupational History  . Not on file  Tobacco Use  . Smoking status: Former Research scientist (life sciences)  . Smokeless tobacco: Never Used  Vaping Use  . Vaping Use: Never used  Substance and Sexual Activity  . Alcohol use: Not Currently  . Drug use: No  . Sexual activity: Not on file  Other Topics Concern  . Not on file  Social History Narrative  . Not on file   Social Determinants of Health   Financial Resource Strain: Not on file   Food Insecurity: Not on file  Transportation Needs: Not on file  Physical Activity: Not on file  Stress: Not on file  Social Connections: Not on file    Allergies:  No Known Allergies  Objective:    Vital Signs:   Temp:  [97.7 F (36.5 C)-98.78 F (37.1 C)] 98.78 F (37.1 C) (04/17 1416) Pulse Rate:  [101-110] 108 (04/17 1416) Resp:  [8-23] 16 (04/17 1416) BP: (107-129)/(57-108) 122/85 (04/17 1416) SpO2:  [96 %-100 %] 100 % (04/17 1416) Weight:  [80.5 kg] 80.5 kg (04/17 0446) Last BM Date: 09/14/20  Weight change: Filed Weights   09/14/20 0012 09/14/20 0500 09/15/20 0446  Weight: 82.7 kg 82.5 kg 80.5 kg    Intake/Output:   Intake/Output Summary (Last 24 hours) at 09/15/2020 1510 Last data  filed at 09/15/2020 1400 Gross per 24 hour  Intake 549.49 ml  Output 2125 ml  Net -1575.51 ml      Physical Exam    General: Sitting up in bed No resp difficulty HEENT: normal + NGT Neck: supple. JVP flat  . Carotids 2+ bilat; no bruits. No lymphadenopathy or thyromegaly appreciated. Cor: PMI nondisplaced. Regular  Tachy  + s3 Lungs: clear Abdomen: soft, nontender, nondistended. No hepatosplenomegaly. No bruits or masses. Good bowel sounds. Extremities: no cyanosis, clubbing, rash, 3+ edema Neuro: alert & orientedx3, cranial nerves grossly intact. moves all 4 extremities w/o difficulty. Affect pleasant  Telemetry   Sinus 100-110 Personally reviewed  EKG    Sinus tach 104 LVH with repol Personally reviewed   Labs   Basic Metabolic Panel: Recent Labs  Lab 09/14/2020 1634 09/22/2020 1839 09/02/2020 2209 09/18/2020 2258 09/09/2020 2300 09/14/20 0031 09/14/20 0940 09/15/20 0321  NA 132*   < > 135 135  --  135 141 144  K 6.7*   < > 5.9* 6.0*  --  5.9* 4.8 4.9  CL 104  --   --   --   --  103 105 111  CO2 19*  --   --   --   --  21* 19* 25  GLUCOSE 109*  --   --   --   --  122* 135* 137*  BUN 117*  --   --   --   --  99* 107* 96*  CREATININE 2.26*  --   --   --   --   1.86* 2.07* 1.99*  CALCIUM 7.8*  --   --   --   --  7.7* 7.8* 7.6*  MG  --   --   --   --  2.8* 2.8*  --   --   PHOS  --   --   --   --  4.2 4.0  --   --    < > = values in this interval not displayed.    Liver Function Tests: Recent Labs  Lab 09/28/2020 1634 09/14/20 0031 09/15/20 0321  AST 824* 783* 476*  ALT 1,200* 1,119* 833*  ALKPHOS 933* 880* 689*  BILITOT 7.0* 5.9* 4.6*  PROT 5.4* 5.4* 4.9*  ALBUMIN 2.2* 2.2* 1.9*   Recent Labs  Lab 09/16/2020 2300  LIPASE 142*   Recent Labs  Lab 09/05/2020 1634  AMMONIA 65*    CBC: Recent Labs  Lab 09/06/2020 1634 09/27/2020 1839 09/09/2020 2209 09/24/2020 2258 09/14/20 0031 09/14/20 0940 09/15/20 0321  WBC 18.4*  --   --   --  18.7* 25.3* 25.0*  NEUTROABS 16.2*  --   --   --   --   --   --   HGB 14.2   < > 13.9 15.3 13.9 14.8 12.3*  HCT 40.3   < > 41.0 45.0 39.3 42.3 36.0*  MCV 83.6  --   --   --  82.6 84.1 87.4  PLT 121*  --   --   --  95* 106* 103*   < > = values in this interval not displayed.    Cardiac Enzymes: Recent Labs  Lab 09/14/20 0031 09/14/20 0940 09/15/20 0836  CKTOTAL 5,299* 6,502* 4,025*  CKMB  --   --  14.1*    BNP: BNP (last 3 results) Recent Labs    09/12/2020 2114 09/14/20 0031 09/15/20 0321  BNP 1,447.9* 1,523.7* 743.8*    ProBNP (last 3 results) No  results for input(s): PROBNP in the last 8760 hours.   CBG: Recent Labs  Lab 09/14/20 1922 09/14/20 2328 09/15/20 0333 09/15/20 0802 09/15/20 1217  GLUCAP 136* 132* 131* 120* 133*    Coagulation Studies: Recent Labs    09/12/2020 2128  LABPROT 19.5*  INR 1.7*     Imaging    No results found.   Medications:     Current Medications: . Chlorhexidine Gluconate Cloth  6 each Topical Daily  . sodium chloride flush  10-40 mL Intracatheter Q12H     Infusions: . ampicillin-sulbactam (UNASYN) IV Stopped (09/15/20 1346)  . DOBUTamine 2 mcg/kg/min (09/15/20 1400)  . heparin 900 Units/hr (09/15/20 1400)        Assessment/Plan   1. Cardiogenic shock with multisystem organ failure - ECHO LVEF 15% with severe global HK with regional variability + LV clot RV moderately down. - suspect this is primary issue with MSOF as a result of low output stated and decreased mental status - co-ox 50% on DBA 2 mcg/kg/min. Will increase to 5 mcg/kg/min - has diffuse LE edema but CVP only 5. Suspect 3rd spacing in setting of low albumin. TSh ok. Urine negative for protein  - UNNA boots. Gentle diuresis  - will need R/L cath as he recovers  2. LV thrombus - continue heparin  3. Shock liver with hepatic encephalopathy - due to low output HF. Transaminases improving with hemodynamics support - Liver u/s suggestive of hepatic steatosis but not long-term cirrhosis. Though low albumin and low PLTs suggestive of more longstanding liver process  - continue supportive care  4. AKI due to ATN/shock - Baseline creatinine unknown  - Creatinine 2.1 in 3/22 -> 1.99 today - SCr 1.5 in 2016 in [place]  5. CAP - PCT 3.70 -> 2.9 - On Unasyn. Would ideally switch to zosyn or cefepime given high salt load with Unasyn  6. Rhabdomyolysis - improving  7. Hyperkalemia - resolving  CRITICAL CARE Performed by: Glori Bickers  Total critical care time: 45 minutes  Critical care time was exclusive of separately billable procedures and treating other patients.  Critical care was necessary to treat or prevent imminent or life-threatening deterioration.  Critical care was time spent personally by me (independent of midlevel providers or residents) on the following activities: development of treatment plan with patient and/or surrogate as well as nursing, discussions with consultants, evaluation of patient's response to treatment, examination of patient, obtaining history from patient or surrogate, ordering and performing treatments and interventions, ordering and review of laboratory studies, ordering and  review of radiographic studies, pulse oximetry and re-evaluation of patient's condition.    Length of Stay: 2  Glori Bickers, MD  09/15/2020, 3:10 PM  Advanced Heart Failure Team Pager [phone] (M-F; 7a - 5p)  Please contact [facility] for night-coverage after hours (4p -7a ) and weekends on [email or web address]

## 2020-09-15 NOTE — Progress Notes (Signed)
ANTICOAGULATION CONSULT NOTE   Pharmacy Consult for Heparin Indication: LV mural thrombus and NSTEMI  Labs: Recent Labs    26-Sep-2020 2114 09/26/20 2128 2020-09-26 2209 09/14/20 0031 09/14/20 0732 09/14/20 0940 09/14/20 1615 09/15/20 0321 09/15/20 0836 09/15/20 1104  HGB  --   --    < > 13.9  --  14.8  --  12.3*  --   --   HCT  --   --    < > 39.3  --  42.3  --  36.0*  --   --   PLT  --   --   --  95*  --  106*  --  103*  --   --   LABPROT  --  19.5*  --   --   --   --   --   --   --   --   INR  --  1.7*  --   --   --   --   --   --   --   --   HEPARINUNFRC  --   --   --   --    < >  --  0.86* 0.42  --  0.38  CREATININE  --   --   --  1.86*  --  2.07*  --  1.99*  --   --   CKTOTAL  --   --   --  5,299*  --  6,502*  --   --  4,025*  --   CKMB  --   --   --   --   --   --   --   --  14.1*  --   TROPONINIHS 106*  --   --   --   --  135*  --   --   --   --    < > = values in this interval not displayed.   Assessment: 55 y.o. M presents with AMS. Pharmacy consulted for heparin for NSTEMI. No AC PTA. H/H ok on admission, plt low at 121.  Confirmatory Heparin level 0.38 units/ml - therapeutic. Hg 12.3, PTLC 103.  No further oozing noted per RN  Goal of Therapy:  Heparin level 0.3-0.7 units/ml Monitor platelets by anticoagulation protocol: Yes   Plan:  Continue heparin at 900 units/hr Monitor daily HL, CBC/plt Monitor for signs/symptoms of bleeding   Thanks for allowing pharmacy to be a part of this patient's care.  Link Snuffer, PharmD, BCPS, BCCCP Clinical Pharmacist Please refer to [facility] for [facility] Pharmacy numbers 09/15/2020, 11:38 AM

## 2020-09-15 NOTE — Procedures (Signed)
Central Venous Catheter Insertion Procedure Note  Haakon Titsworth  [id number]  [birth year]  Date:09/15/20  Time:7:16 AM   Provider Performing:Eureka Valdes   Procedure: Insertion of Non-tunneled Central Venous [phone]) with US guidance ([id number])   Indication(s) Medication administration  Consent Risks of the procedure as well as the alternatives and risks of each were explained to the patient and/or caregiver.  Consent for the procedure was obtained and is signed in the bedside chart  Anesthesia Topical only with 1% lidocaine   Timeout Verified patient identification, verified procedure, site/side was marked, verified correct patient position, special equipment/implants available, medications/allergies/relevant history reviewed, required imaging and test results available.  Sterile Technique Maximal sterile technique including full sterile barrier drape, hand hygiene, sterile gown, sterile gloves, mask, hair covering, sterile ultrasound probe cover (if used).  Procedure Description Area of catheter insertion was cleaned with chlorhexidine and draped in sterile fashion.  With real-time ultrasound guidance a central venous catheter was placed into the right internal jugular vein. Nonpulsatile blood flow and easy flushing noted in all ports.  The catheter was sutured in place and sterile dressing applied.  Complications/Tolerance None; patient tolerated the procedure well. Chest X-ray is ordered to verify placement for internal jugular or subclavian cannulation.   Chest x-ray is not ordered for femoral cannulation.  EBL Minimal  Specimen(s) None

## 2020-09-16 ENCOUNTER — Inpatient Hospital Stay (HOSPITAL_COMMUNITY): Payer: BLUE CROSS/BLUE SHIELD

## 2020-09-16 DIAGNOSIS — I5021 Acute systolic (congestive) heart failure: Secondary | ICD-10-CM | POA: Diagnosis not present

## 2020-09-16 DIAGNOSIS — R570 Cardiogenic shock: Secondary | ICD-10-CM | POA: Diagnosis not present

## 2020-09-16 LAB — CBC WITH DIFFERENTIAL/PLATELET
Abs Immature Granulocytes: 0.27 10*3/uL — ABNORMAL HIGH (ref 0.00–0.07)
Basophils Absolute: 0 10*3/uL (ref 0.0–0.1)
Basophils Relative: 0 %
Eosinophils Absolute: 0 10*3/uL (ref 0.0–0.5)
Eosinophils Relative: 0 %
HCT: 25.6 % — ABNORMAL LOW (ref 39.0–52.0)
Hemoglobin: 8.4 g/dL — ABNORMAL LOW (ref 13.0–17.0)
Immature Granulocytes: 1 %
Lymphocytes Relative: 4 %
Lymphs Abs: 1.3 10*3/uL (ref 0.7–4.0)
MCH: 29.6 pg (ref 26.0–34.0)
MCHC: 32.8 g/dL (ref 30.0–36.0)
MCV: 90.1 fL (ref 80.0–100.0)
Monocytes Absolute: 2.5 10*3/uL — ABNORMAL HIGH (ref 0.1–1.0)
Monocytes Relative: 8 %
Neutro Abs: 25.8 10*3/uL — ABNORMAL HIGH (ref 1.7–7.7)
Neutrophils Relative %: 87 %
Platelets: 72 10*3/uL — ABNORMAL LOW (ref 150–400)
RBC: 2.84 MIL/uL — ABNORMAL LOW (ref 4.22–5.81)
RDW: 18.3 % — ABNORMAL HIGH (ref 11.5–15.5)
WBC: 29.8 10*3/uL — ABNORMAL HIGH (ref 4.0–10.5)
nRBC: 0.1 % (ref 0.0–0.2)

## 2020-09-16 LAB — COMPREHENSIVE METABOLIC PANEL
ALT: 576 U/L — ABNORMAL HIGH (ref 0–44)
AST: 244 U/L — ABNORMAL HIGH (ref 15–41)
Albumin: 1.7 g/dL — ABNORMAL LOW (ref 3.5–5.0)
Alkaline Phosphatase: 472 U/L — ABNORMAL HIGH (ref 38–126)
Anion gap: 5 (ref 5–15)
BUN: 97 mg/dL — ABNORMAL HIGH (ref 6–20)
CO2: 28 mmol/L (ref 22–32)
Calcium: 7.5 mg/dL — ABNORMAL LOW (ref 8.9–10.3)
Chloride: 112 mmol/L — ABNORMAL HIGH (ref 98–111)
Creatinine, Ser: 1.88 mg/dL — ABNORMAL HIGH (ref 0.61–1.24)
GFR, Estimated: 42 mL/min — ABNORMAL LOW (ref >60)
Glucose, Bld: 132 mg/dL — ABNORMAL HIGH (ref 70–99)
Potassium: 4.5 mmol/L (ref 3.5–5.1)
Sodium: 145 mmol/L (ref 135–145)
Total Bilirubin: 3.4 mg/dL — ABNORMAL HIGH (ref 0.3–1.2)
Total Protein: 4.5 g/dL — ABNORMAL LOW (ref 6.5–8.1)

## 2020-09-16 LAB — CBC
HCT: 27.7 % — ABNORMAL LOW (ref 39.0–52.0)
HCT: 25.4 % — ABNORMAL LOW (ref 39.0–52.0)
Hemoglobin: 9.4 g/dL — ABNORMAL LOW (ref 13.0–17.0)
Hemoglobin: 8.3 g/dL — ABNORMAL LOW (ref 13.0–17.0)
MCH: 30.7 pg (ref 26.0–34.0)
MCH: 29.4 pg (ref 26.0–34.0)
MCHC: 33.9 g/dL (ref 30.0–36.0)
MCHC: 32.7 g/dL (ref 30.0–36.0)
MCV: 90.5 fL (ref 80.0–100.0)
MCV: 90.1 fL (ref 80.0–100.0)
Platelets: 96 10*3/uL — ABNORMAL LOW (ref 150–400)
Platelets: 75 10*3/uL — ABNORMAL LOW (ref 150–400)
RBC: 3.06 MIL/uL — ABNORMAL LOW (ref 4.22–5.81)
RBC: 2.82 MIL/uL — ABNORMAL LOW (ref 4.22–5.81)
RDW: 18.7 % — ABNORMAL HIGH (ref 11.5–15.5)
RDW: 19 % — ABNORMAL HIGH (ref 11.5–15.5)
WBC: 28.1 10*3/uL — ABNORMAL HIGH (ref 4.0–10.5)
WBC: 31 10*3/uL — ABNORMAL HIGH (ref 4.0–10.5)
nRBC: 0.1 % (ref 0.0–0.2)
nRBC: 0.1 % (ref 0.0–0.2)

## 2020-09-16 LAB — COOXEMETRY PANEL
Carboxyhemoglobin: 1.5 % (ref 0.5–1.5)
Carboxyhemoglobin: 1.5 % (ref 0.5–1.5)
Methemoglobin: 0.7 % (ref 0.0–1.5)
Methemoglobin: 0.9 % (ref 0.0–1.5)
O2 Saturation: 48.5 %
O2 Saturation: 46 %
Total hemoglobin: 9.2 g/dL — ABNORMAL LOW (ref 12.0–16.0)
Total hemoglobin: 8.5 g/dL — ABNORMAL LOW (ref 12.0–16.0)

## 2020-09-16 LAB — LEGIONELLA PNEUMOPHILA SEROGP 1 UR AG: L. pneumophila Serogp 1 Ur Ag: NEGATIVE

## 2020-09-16 LAB — GLUCOSE, CAPILLARY: Glucose-Capillary: 125 mg/dL — ABNORMAL HIGH (ref 70–99)

## 2020-09-16 LAB — ABO/RH: ABO/RH(D): A POS

## 2020-09-16 LAB — BILIRUBIN, DIRECT: Bilirubin, Direct: 1.5 mg/dL — ABNORMAL HIGH (ref 0.0–0.2)

## 2020-09-16 LAB — HEMOGLOBIN AND HEMATOCRIT, BLOOD
HCT: 26.5 % — ABNORMAL LOW (ref 39.0–52.0)
Hemoglobin: 8.7 g/dL — ABNORMAL LOW (ref 13.0–17.0)

## 2020-09-16 LAB — LACTATE DEHYDROGENASE: LDH: 630 U/L — ABNORMAL HIGH (ref 98–192)

## 2020-09-16 LAB — PREPARE RBC (CROSSMATCH)

## 2020-09-16 LAB — HEPARIN LEVEL (UNFRACTIONATED): Heparin Unfractionated: 0.36 IU/mL (ref 0.30–0.70)

## 2020-09-16 LAB — ANA W/REFLEX IF POSITIVE: Anti Nuclear Antibody (ANA): NEGATIVE

## 2020-09-16 MED ORDER — CALCIUM GLUCONATE-NACL 2-0.675 GM/100ML-% IV SOLN
2.0000 g | Freq: Once | INTRAVENOUS | Status: AC
  Administered 2020-09-16: 2000 mg via INTRAVENOUS
  Filled 2020-09-16: qty 100

## 2020-09-16 MED ORDER — SODIUM CHLORIDE 0.9% IV SOLUTION: Freq: Once | INTRAVENOUS | Status: AC

## 2020-09-16 NOTE — Progress Notes (Addendum)
Advanced Heart Failure Rounding Note  PCP-Cardiologist: No primary care provider on file.    Patient Profile   55 y/o male w/ no prior cardiac history, admitted for mixed cardiogenic + septic shock in setting of PNA + hepatic encephalopathy.   ECHO LVEF 15% with severe global HK with regional variability + LV clot RV moderately down. Initial Co-ox 48%. Started on DBA.    Subjective:    On DBA 5. Co-ox this morning 49%. Repeat 46%.   WBC higher, 25>>28K. AF overnight.   -2.7L in UOP yesterday. CVP 4-5  Scr 2.07>>1.99>>1.88. BUN 97   HFTs down-trending   Drop in Hgb 12>>9. BP soft upper 90s. Sinus tach on tele. Denies flank/ LBP.   Sitting up in bed. Denies dyspnea.    Objective:   Weight Range: 75.7 kg Body mass index is 22.63 kg/m.   Vital Signs:   Temp:  [96.8 F (36 C)-98.78 F (37.1 C)] 97.52 F (36.4 C) (04/18 0800) Pulse Rate:  [97-110] 108 (04/18 0800) Resp:  [0-21] 18 (04/18 0800) BP: (89-138)/(63-102) 113/82 (04/18 0800) SpO2:  [96 %-100 %] 100 % (04/18 0800) Weight:  [75.7 kg] 75.7 kg (04/18 0500) Last BM Date: 09/14/20  Weight change: Filed Weights   09/14/20 0500 09/15/20 0446 09/16/20 0500  Weight: 82.5 kg 80.5 kg 75.7 kg    Intake/Output:   Intake/Output Summary (Last 24 hours) at 09/16/2020 1207 Last data filed at 09/16/2020 0934 Gross per 24 hour  Intake 861.98 ml  Output 2885 ml  Net -2023.02 ml      Physical Exam    CVP 4-5  General:  fatigue appearing, sitting up in bed. No resp difficulty HEENT: Normal Neck: Supple. No JVP . Carotids 2+ bilat; no bruits. No lymphadenopathy or thyromegaly appreciated. Cor: PMI nondisplaced. Regular rhythm, tachy rate. No rubs, gallops or murmurs. Lungs: Clear Abdomen: Soft, nontender, nondistended. No hepatosplenomegaly. No bruits or masses. Good bowel sounds. Extremities: No cyanosis, clubbing, rash, 2+ bilateral pretibial edema Neuro: Alert & orientedx3, cranial nerves grossly intact.  moves all 4 extremities w/o difficulty. Affect pleasant   Telemetry   Sinus tach, low 100s. Several brief runs of NSVT, longest 10 beats   EKG    No new ekg to review  Labs    CBC Recent Labs    09/23/2020 1634 09/16/2020 1839 09/15/20 0321 09/16/20 0501  WBC 18.4*   < > 25.0* 28.1*  NEUTROABS 16.2*  --   --   --   HGB 14.2   < > 12.3* 9.4*  HCT 40.3   < > 36.0* 27.7*  MCV 83.6   < > 87.4 90.5  PLT 121*   < > 103* 96*   < > = values in this interval not displayed.   Basic Metabolic Panel Recent Labs    02/77/41 2300 09/14/20 0031 09/14/20 0940 09/15/20 0321 09/16/20 0501  NA  --  135   < > 144 145  K  --  5.9*   < > 4.9 4.5  CL  --  103   < > 111 112*  CO2  --  21*   < > 25 28  GLUCOSE  --  122*   < > 137* 132*  BUN  --  99*   < > 96* 97*  CREATININE  --  1.86*   < > 1.99* 1.88*  CALCIUM  --  7.7*   < > 7.6* 7.5*  MG 2.8* 2.8*  --   --   --  PHOS 4.2 4.0  --   --   --    < > = values in this interval not displayed.   Liver Function Tests Recent Labs    09/15/20 0321 09/16/20 0501  AST 476* 244*  ALT 833* 576*  ALKPHOS 689* 472*  BILITOT 4.6* 3.4*  PROT 4.9* 4.5*  ALBUMIN 1.9* 1.7*   Recent Labs    09/28/2020 2300  LIPASE 142*   Cardiac Enzymes Recent Labs    09/14/20 0031 09/14/20 0940 09/15/20 0836  CKTOTAL 5,299* 6,502* 4,025*  CKMB  --   --  14.1*    BNP: BNP (last 3 results) Recent Labs    09/03/2020 2114 09/14/20 0031 09/15/20 0321  BNP 1,447.9* 1,523.7* 743.8*    ProBNP (last 3 results) No results for input(s): PROBNP in the last 8760 hours.   D-Dimer No results for input(s): DDIMER in the last 72 hours. Hemoglobin A1C No results for input(s): HGBA1C in the last 72 hours. Fasting Lipid Panel No results for input(s): CHOL, HDL, LDLCALC, TRIG, CHOLHDL, LDLDIRECT in the last 72 hours. Thyroid Function Tests Recent Labs    09/04/2020 2128  TSH 2.611    Other results:   Imaging     No results found.   Medications:      Scheduled Medications: . Chlorhexidine Gluconate Cloth  6 each Topical Daily  . furosemide  40 mg Intravenous BID  . sodium chloride flush  10-40 mL Intracatheter Q12H     Infusions: . ceFEPime (MAXIPIME) IV Stopped (09/16/20 0636)  . DOBUTamine 5 mcg/kg/min (09/16/20 0900)  . heparin 900 Units/hr (09/16/20 0900)     PRN Medications:  docusate, sodium chloride flush    Patient Profile   55 y/o male w/ no prior cardiac history, admitted for mixed cardiogenic + septic shock in setting of PNA + hepatic encephalopathy.   ECHO LVEF 15% with severe global HK with regional variability + LV clot RV moderately down. Initial Co-ox 48%. Started on DBA.    Assessment/Plan   1. Cardiogenic shock with multisystem organ failure - ECHO LVEF 15% with severe global HK with regional variability + LV clot RV moderately down. - suspect this is primary issue with MSOF as a result of low output stated and decreased mental status - on DBA 5 mcg/kg/min. Co-ox 49%. Repeat 46%. BP soft add NE. F/u co-ox this afternoon.  - has diffuse LE edema but CVP only 4-5. Suspect 3rd spacing in setting of low albumin. TSh ok. Urine negative for protein  - will place UNNA boots. C/w gentle diuresis  - will need R/L cath as he recovers  2. LV thrombus - continue heparin  3. Shock liver with hepatic encephalopathy - due to low output HF. Transaminases improving with hemodynamics support - Liver u/s suggestive of hepatic steatosis but not long-term cirrhosis. Though low albumin and low PLTs suggestive of more longstanding liver process  - continue supportive care  4. AKI due to ATN/shock - Baseline creatinine unknown  - SCr 1.5 in 2016 in Care Everywhere - Creatinine 2.1 in 3/22 -> 1.99->1.88 today  5. CAP - PCT 3.70 -> 2.9 - c/w cefepime  6. Rhabdomyolysis - improving   7. Hyperkalemia - resolved   8. Anemia - drop in Hgb 12>>9 - on heparin for LV thrombus - plts 103>>96  - repeat  STAT CBC - if repeat hgb still low will need imaging to r/o RPB   Length of Stay: [id number] Sharol Harness, PA-C  09/16/2020, 12:07  PM  Advanced Heart Failure Team Pager [phone] (M-F; 7a - 5p)  Please contact [facility] for night-coverage after hours (5p -7a ) and weekends on [email or web address]  Agree.  Remains on DBA 5. Co-ox remains < 50%. Feels weak but a bit more alert. Modest diuresis. Hgb continues to drop. Renal function and LFTs improving. CVP 5  General:  Weak appearing. No resp difficulty HEENT: normal Neck: supple. no JVD. Carotids 2+ bilat; no bruits. No lymphadenopathy or thryomegaly appreciated. Cor: PMI nondisplaced. Regular rate & rhythm. No rubs, gallops or murmurs. Lungs: clear Abdomen: soft, nontender, nondistended. No hepatosplenomegaly. No bruits or masses. Good bowel sounds. Extremities: no cyanosis, clubbing, rash, 3+ edema Neuro: alert & orientedx3, cranial nerves grossly intact. moves all 4 extremities w/o difficulty. Affect pleasant  Remains very tenuous. Co-ox low on DBA 5 but also in setting of dropping hgb. No clear site of bleed. Will continue DBA and IV lasix. Wrap LE to help with 3rd spacing. Recheck CBC stat.   CRITICAL CARE Performed by: Arvilla Meres  Total critical care time: 35 minutes  Critical care time was exclusive of separately billable procedures and treating other patients.  Critical care was necessary to treat or prevent imminent or life-threatening deterioration.  Critical care was time spent personally by me (independent of midlevel providers or residents) on the following activities: development of treatment plan with patient and/or surrogate as well as nursing, discussions with consultants, evaluation of patient's response to treatment, examination of patient, obtaining history from patient or surrogate, ordering and performing treatments and interventions, ordering and review of laboratory studies, ordering and review of radiographic  studies, pulse oximetry and re-evaluation of patient's condition.  Arvilla Meres, MD  2:14 PM

## 2020-09-16 NOTE — Progress Notes (Signed)
Orthopedic Tech Progress Note Patient Details:  Dennis Howard. 21-Feb-1966 [id number]  Ortho Devices Type of Ortho Device: Roland Rack boot Ortho Device/Splint Location: BLE Ortho Device/Splint Interventions: Ordered,Application,Adjustment   Post Interventions Patient Tolerated: Well Instructions Provided: Care of device   Donald Pore 09/16/2020, 2:04 PM

## 2020-09-16 NOTE — Progress Notes (Signed)
NAME:  Dennis Renwick., MRN:  [id number], DOB:  1965/08/12, LOS: 3 ADMISSION DATE:  09/15/2020, CONSULTATION DATE: 09/09/2020 REFERRING MD: Dina Rich - EM CHIEF COMPLAINT:  Altered Mental Status  History of Present Illness:  55 yo man with a past medical history of chronic kidney disease, hypertension who presented to [facility] on 4/15 with CC AMS.  Symptoms started a week ago.  His baseline mental status is alert and oriented to person place time and event.  Mental status changes were described as somnolent and decreased responsiveness.  He has associated lower extremity swelling, weight gain, shortness of breath, and cough. No associated chest pain, fever, syncope. He has never had these symptoms before.  He was recently seen at [facility] emergency department on 08/18/2020 for cough and shortness of breath.  He was diagnosed with pneumonia and discharged home with doxycycline.  He now returns to our hospital with altered mental status and lower extremity swelling which were not present at the time of his emergency department visit.  He works for [employer].  Former tobacco smoker.  Quit years ago.  No alcohol.  Occasional marijuana per prior records.   History is limited due to altered mental status.   Pertinent  Medical History  CKD III HTN Bronchitis  Significant Hospital Events: Including procedures, antibiotic start and stop dates in addition to other pertinent events   . 4/15 Admitted to ICU . 4/16 Started on dobuatmine drip  Antibiotics: 4/15 Azithromycin x 1  4/15 Ceftriaxone x 1 4/15 Flagyl x 1 4/15 Unasyn >> 4/17 Cefepime 4/17>>  Interim History / Subjective:  No acute events overnight.   Dobutamine drip increased to 5 yesterday.   He is net negative 1.7L for the past 24 hours.  He reports feeling a little better this morning. He is tolerating soft diet. No nausea or vomiting. He is passing gas.   Objective   Blood pressure (!) 119/102, pulse (!) 106, temperature (!) 97.52 F  (36.4 C), resp. rate 18, height 6' (1.829 m), weight 75.7 kg, SpO2 100 %. CVP:  [2 mmHg-5 mmHg] 5 mmHg      Intake/Output Summary (Last 24 hours) at 09/16/2020 0827 Last data filed at 09/16/2020 0700 Gross per 24 hour  Intake 854.55 ml  Output 2465 ml  Net -1610.45 ml   Filed Weights   09/14/20 0500 09/15/20 0446 09/16/20 0500  Weight: 82.5 kg 80.5 kg 75.7 kg    Examination: General: middle aged man, no acute distress HENT: [place]/AT, moist mucous membranes, PERRL, NG tube in place Lungs: diminished breath sounds. No wheezes or rhonchi. Cardiovascular: tachycardic. No murmurs, rubs or gallops. +JVD Abdomen: soft, non-tender. BS+, non-distended Extremities:  2+ edema lower extremities Neuro: alert and oriented x 3. Moving all extremities. GU: foley in place  Labs/imaging that I have personally reviewed  (right click and "Reselect all SmartList Selections" daily)  CMP: Cr 1.88, K 4.5, Alk phos 472, AST/ALT 354/656   TSH 2.6 Salicylate <7 Alcohol <81 Utox - negative Acetaminophen <10  CXR - patchy airspace opacities at right base and mid lung and minimal opacity in left lung. [place] Abd - biliary sludge, no biliary duct dilation, no evidence of acute cholecystitis. Heterogenous liver.   Ct Head - unremarkable CT Abdomen pelvis - no acute intra-abdominal process   Echo 4/16 - severely reduced EF with mural thrombus of the LV.  Resolved Hospital Problem list   Encephalopathy  Assessment & Plan:   Cardiogenic Shock Acute Biventricular Heart Failure LV Thrombus  NSTEMI - Unknown etiology of heart failure at this time. Ischemic vs infectious vs inflammatory - Cardiology following, started on dobutamine on 4/17. Heart failure team now following - Dobutamine at 61mg/kg/min  - Continue heparin drip - ANA and antiphospholipid panel pending - Lasix 457mIV twice daily - Trend Co-ox panels - Plan for LHC/RHC at some point  Sepsis due to Pneumonia Aspiration Pneumonia - Unasyn  changed to cefepime due to salt content of unasyn - Procalcitonin trending down  Congestive Hepatopathy Dilated Bowel Loops - improved - LFTs and T. Bili trending down - Remove NG tube today  Acute on Chronic Kidney Disease stage IIIA-B Lactic Acidosis - Continue diuresis as above for heart failure - urine output is appropriate  Thrombocytopenia  - In setting of hepatic congestion  - Continue to monitor  Elevated Creatinine Kinase - Total CK trending down  Best practice (right click and "Reselect all SmartList Selections" daily)  Diet:  Oral Pain/Anxiety/Delirium protocol (if indicated): No VAP protocol (if indicated): Not indicated DVT prophylaxis: Systemic AC GI prophylaxis: PPI Glucose control:  SSI No Central venous access:  Yes, and it is still needed Arterial line:  N/A Foley:  Yes, and it is still needed Mobility:  bed rest  PT consulted: N/A Last date of multidisciplinary goals of care discussion - N/A Code Status:  full code Disposition: ICU  Labs   CBC: Recent Labs  Lab 09/22/2020 1634 09/01/2020 1839 09/21/2020 2209 09/28/2020 2258 09/14/20 0031 09/14/20 0940 09/15/20 0321  WBC 18.4*  --   --   --  18.7* 25.3* 25.0*  NEUTROABS 16.2*  --   --   --   --   --   --   HGB 14.2   < > 13.9 15.3 13.9 14.8 12.3*  HCT 40.3   < > 41.0 45.0 39.3 42.3 36.0*  MCV 83.6  --   --   --  82.6 84.1 87.4  PLT 121*  --   --   --  95* 106* 103*   < > = values in this interval not displayed.    Basic Metabolic Panel: Recent Labs  Lab 09/08/2020 1634 09/14/2020 1839 09/03/2020 2258 09/11/2020 2300 09/14/20 0031 09/14/20 0940 09/15/20 0321 09/16/20 0501  NA 132*   < > 135  --  135 141 144 145  K 6.7*   < > 6.0*  --  5.9* 4.8 4.9 4.5  CL 104  --   --   --  103 105 111 112*  CO2 19*  --   --   --  21* 19* 25 28  GLUCOSE 109*  --   --   --  122* 135* 137* 132*  BUN 117*  --   --   --  99* 107* 96* 97*  CREATININE 2.26*  --   --   --  1.86* 2.07* 1.99* 1.88*  CALCIUM 7.8*  --    --   --  7.7* 7.8* 7.6* 7.5*  MG  --   --   --  2.8* 2.8*  --   --   --   PHOS  --   --   --  4.2 4.0  --   --   --    < > = values in this interval not displayed.   GFR: Estimated Creatinine Clearance: 48.7 mL/min (A) (by C-G formula based on SCr of 1.88 mg/dL (H)). Recent Labs  Lab 09/07/2020 1634 09/07/2020 1802 09/01/2020 2114 09/14/20 0031 09/14/20 0940 09/15/20  0321 09/15/20 0836  PROCALCITON  --   --   --   --  3.70  --  2.90  WBC 18.4*  --   --  18.7* 25.3* 25.0*  --   LATICACIDVEN  --  2.2* 2.7*  --   --   --   --     Liver Function Tests: Recent Labs  Lab 09/22/2020 1634 09/14/20 0031 09/15/20 0321 09/16/20 0501  AST 824* 783* 476* 244*  ALT 1,200* 1,119* 833* 576*  ALKPHOS 933* 880* 689* 472*  BILITOT 7.0* 5.9* 4.6* 3.4*  PROT 5.4* 5.4* 4.9* 4.5*  ALBUMIN 2.2* 2.2* 1.9* 1.7*   Recent Labs  Lab 09/03/2020 2300  LIPASE 142*   Recent Labs  Lab 08/30/2020 1634  AMMONIA 65*    ABG    Component Value Date/Time   PHART 7.437 09/26/2020 2258   PCO2ART 29.3 (L) 09/17/2020 2258   PO2ART 50 (L) 09/22/2020 2258   HCO3 21.7 09/14/2020 0031   TCO2 21 (L) 09/27/2020 2258   ACIDBASEDEF 1.8 09/14/2020 0031   O2SAT 48.5 09/16/2020 0501     Coagulation Profile: Recent Labs  Lab 09/08/2020 2128  INR 1.7*    Cardiac Enzymes: Recent Labs  Lab 09/14/20 0031 09/14/20 0940 09/15/20 0836  CKTOTAL 5,299* 6,502* 4,025*  CKMB  --   --  14.1*    HbA1C: No results found for: HGBA1C  CBG: Recent Labs  Lab 09/15/20 1217 09/15/20 1537 09/15/20 2004 09/15/20 2339 09/16/20 0324  GLUCAP 133* 108* 151* 143* 125*      Critical care time: 35 minutes     Freda Jackson, MD [facility] Office: [phone]   See Amion for personal pager PCCM on call pager [phone] until 7pm. Please call Elink 7p-7a. [phone]

## 2020-09-16 NOTE — Progress Notes (Signed)
ANTICOAGULATION CONSULT NOTE   Pharmacy Consult for Heparin Indication: LV mural thrombus and NSTEMI  Labs: Recent Labs    09/28/20 2114 2020/09/28 2128 09-28-2020 2209 09/14/20 0031 09/14/20 0732 09/14/20 0940 09/14/20 1615 09/15/20 0321 09/15/20 0836 09/15/20 1104 09/16/20 0501  HGB  --   --    < > 13.9  --  14.8  --  12.3*  --   --   --   HCT  --   --    < > 39.3  --  42.3  --  36.0*  --   --   --   PLT  --   --   --  95*  --  106*  --  103*  --   --   --   LABPROT  --  19.5*  --   --   --   --   --   --   --   --   --   INR  --  1.7*  --   --   --   --   --   --   --   --   --   HEPARINUNFRC  --   --   --   --    < >  --    < > 0.42  --  0.38 0.36  CREATININE  --   --   --  1.86*  --  2.07*  --  1.99*  --   --  1.88*  CKTOTAL  --   --   --  5,299*  --  6,502*  --   --  4,025*  --   --   CKMB  --   --   --   --   --   --   --   --  14.1*  --   --   TROPONINIHS 106*  --   --   --   --  135*  --   --   --   --   --    < > = values in this interval not displayed.   Assessment: 55 y.o. M presents with AMS. Pharmacy consulted for heparin for NSTEMI. No AC PTA. H/H ok on admission, plt low at 121.  Heparin level 0.36 units/ml - therapeutic. Hg 12.3, PTLC 103.  No bleeding noted per RN  Goal of Therapy:  Heparin level 0.3-0.7 units/ml Monitor platelets by anticoagulation protocol: Yes   Plan:  Continue heparin at 900 units/hr Monitor daily HL, CBC/plt Monitor for signs/symptoms of bleeding   Thanks for allowing pharmacy to be a part of this patient's care.  Jeanella Cara, PharmD, [facility] Clinical Pharmacist Please see AMION for all Pharmacists' Contact Phone Numbers 09/16/2020, 7:06 AM

## 2020-09-17 ENCOUNTER — Inpatient Hospital Stay (HOSPITAL_COMMUNITY): Payer: BLUE CROSS/BLUE SHIELD

## 2020-09-17 DIAGNOSIS — D696 Thrombocytopenia, unspecified: Secondary | ICD-10-CM | POA: Diagnosis not present

## 2020-09-17 DIAGNOSIS — R579 Shock, unspecified: Secondary | ICD-10-CM

## 2020-09-17 DIAGNOSIS — I5021 Acute systolic (congestive) heart failure: Secondary | ICD-10-CM | POA: Diagnosis not present

## 2020-09-17 DIAGNOSIS — R570 Cardiogenic shock: Secondary | ICD-10-CM | POA: Diagnosis not present

## 2020-09-17 DIAGNOSIS — E43 Unspecified severe protein-calorie malnutrition: Secondary | ICD-10-CM | POA: Insufficient documentation

## 2020-09-17 DIAGNOSIS — G9340 Encephalopathy, unspecified: Secondary | ICD-10-CM | POA: Diagnosis not present

## 2020-09-17 DIAGNOSIS — D649 Anemia, unspecified: Secondary | ICD-10-CM | POA: Diagnosis not present

## 2020-09-17 DIAGNOSIS — N189 Chronic kidney disease, unspecified: Secondary | ICD-10-CM | POA: Diagnosis not present

## 2020-09-17 DIAGNOSIS — R600 Localized edema: Secondary | ICD-10-CM

## 2020-09-17 DIAGNOSIS — A419 Sepsis, unspecified organism: Secondary | ICD-10-CM | POA: Diagnosis not present

## 2020-09-17 DIAGNOSIS — R918 Other nonspecific abnormal finding of lung field: Secondary | ICD-10-CM | POA: Diagnosis not present

## 2020-09-17 DIAGNOSIS — R531 Weakness: Secondary | ICD-10-CM

## 2020-09-17 LAB — CBC
HCT: 25.6 % — ABNORMAL LOW (ref 39.0–52.0)
HCT: 23.7 % — ABNORMAL LOW (ref 39.0–52.0)
Hemoglobin: 8.7 g/dL — ABNORMAL LOW (ref 13.0–17.0)
Hemoglobin: 8 g/dL — ABNORMAL LOW (ref 13.0–17.0)
MCH: 30 pg (ref 26.0–34.0)
MCH: 30.2 pg (ref 26.0–34.0)
MCHC: 34 g/dL (ref 30.0–36.0)
MCHC: 33.8 g/dL (ref 30.0–36.0)
MCV: 88.3 fL (ref 80.0–100.0)
MCV: 89.4 fL (ref 80.0–100.0)
Platelets: 69 10*3/uL — ABNORMAL LOW (ref 150–400)
Platelets: 71 10*3/uL — ABNORMAL LOW (ref 150–400)
RBC: 2.9 MIL/uL — ABNORMAL LOW (ref 4.22–5.81)
RBC: 2.65 MIL/uL — ABNORMAL LOW (ref 4.22–5.81)
RDW: 18.9 % — ABNORMAL HIGH (ref 11.5–15.5)
RDW: 19.3 % — ABNORMAL HIGH (ref 11.5–15.5)
WBC: 35.1 10*3/uL — ABNORMAL HIGH (ref 4.0–10.5)
WBC: 34 10*3/uL — ABNORMAL HIGH (ref 4.0–10.5)
nRBC: 0.2 % (ref 0.0–0.2)
nRBC: 0.2 % (ref 0.0–0.2)

## 2020-09-17 LAB — COMPREHENSIVE METABOLIC PANEL
ALT: 401 U/L — ABNORMAL HIGH (ref 0–44)
AST: 189 U/L — ABNORMAL HIGH (ref 15–41)
Albumin: 1.6 g/dL — ABNORMAL LOW (ref 3.5–5.0)
Alkaline Phosphatase: 348 U/L — ABNORMAL HIGH (ref 38–126)
Anion gap: 7 (ref 5–15)
BUN: 98 mg/dL — ABNORMAL HIGH (ref 6–20)
CO2: 28 mmol/L (ref 22–32)
Calcium: 7.5 mg/dL — ABNORMAL LOW (ref 8.9–10.3)
Chloride: 110 mmol/L (ref 98–111)
Creatinine, Ser: 1.97 mg/dL — ABNORMAL HIGH (ref 0.61–1.24)
GFR, Estimated: 40 mL/min — ABNORMAL LOW (ref >60)
Glucose, Bld: 140 mg/dL — ABNORMAL HIGH (ref 70–99)
Potassium: 4.5 mmol/L (ref 3.5–5.1)
Sodium: 145 mmol/L (ref 135–145)
Total Bilirubin: 3.3 mg/dL — ABNORMAL HIGH (ref 0.3–1.2)
Total Protein: 4.2 g/dL — ABNORMAL LOW (ref 6.5–8.1)

## 2020-09-17 LAB — FERRITIN: Ferritin: 234 ng/mL (ref 24–336)

## 2020-09-17 LAB — HEPARIN LEVEL (UNFRACTIONATED)
Heparin Unfractionated: <0.1 IU/mL — ABNORMAL LOW (ref 0.30–0.70)
Heparin Unfractionated: <0.1 IU/mL — ABNORMAL LOW (ref 0.30–0.70)

## 2020-09-17 LAB — TYPE AND SCREEN
ABO/RH(D): A POS
Antibody Screen: NEGATIVE
Unit division: 0

## 2020-09-17 LAB — COOXEMETRY PANEL
Carboxyhemoglobin: 1.7 % — ABNORMAL HIGH (ref 0.5–1.5)
Carboxyhemoglobin: 1.5 % (ref 0.5–1.5)
Methemoglobin: 1.3 % (ref 0.0–1.5)
Methemoglobin: 1.2 % (ref 0.0–1.5)
O2 Saturation: 44.5 %
O2 Saturation: 48.2 %
Total hemoglobin: 8.9 g/dL — ABNORMAL LOW (ref 12.0–16.0)
Total hemoglobin: 8.3 g/dL — ABNORMAL LOW (ref 12.0–16.0)

## 2020-09-17 LAB — BPAM RBC
Blood Product Expiration Date: 202204252359
ISSUE DATE / TIME: 202204181622
Unit Type and Rh: 6200

## 2020-09-17 LAB — FIBRINOGEN: Fibrinogen: 408 mg/dL (ref 210–475)

## 2020-09-17 LAB — HAPTOGLOBIN: Haptoglobin: 47 mg/dL (ref 29–370)

## 2020-09-17 LAB — RETICULOCYTES
Immature Retic Fract: 25.2 % — ABNORMAL HIGH (ref 2.3–15.9)
RBC.: 2.8 MIL/uL — ABNORMAL LOW (ref 4.22–5.81)
Retic Count, Absolute: 121.2 10*3/uL (ref 19.0–186.0)
Retic Ct Pct: 4.3 % — ABNORMAL HIGH (ref 0.4–3.1)

## 2020-09-17 LAB — CK: Total CK: 3219 U/L — ABNORMAL HIGH (ref 49–397)

## 2020-09-17 LAB — LACTIC ACID, PLASMA
Lactic Acid, Venous: 2.9 mmol/L (ref 0.5–1.9)
Lactic Acid, Venous: 2.2 mmol/L (ref 0.5–1.9)

## 2020-09-17 LAB — PROTIME-INR
INR: 1.4 — ABNORMAL HIGH (ref 0.8–1.2)
Prothrombin Time: 17.1 seconds — ABNORMAL HIGH (ref 11.4–15.2)

## 2020-09-17 LAB — PATHOLOGIST SMEAR REVIEW

## 2020-09-17 LAB — SEDIMENTATION RATE: Sed Rate: 17 mm/hr — ABNORMAL HIGH (ref 0–16)

## 2020-09-17 LAB — SAVE SMEAR(SSMR), FOR PROVIDER SLIDE REVIEW

## 2020-09-17 LAB — AMMONIA: Ammonia: 17 umol/L (ref 9–35)

## 2020-09-17 LAB — C-REACTIVE PROTEIN: CRP: 5.7 mg/dL — ABNORMAL HIGH (ref <1.0)

## 2020-09-17 MED ORDER — ADULT MULTIVITAMIN W/MINERALS CH
1.0000 | ORAL_TABLET | Freq: Every day | ORAL | Status: DC
  Administered 2020-09-17: 1 via ORAL
  Filled 2020-09-17: qty 1

## 2020-09-17 MED ORDER — MAGIC MOUTHWASH
10.0000 mL | Freq: Three times a day (TID) | ORAL | Status: DC
  Administered 2020-09-17 – 2020-10-01 (×40): 10 mL via ORAL
  Filled 2020-09-17 (×50): qty 10

## 2020-09-17 MED ORDER — CHLORHEXIDINE GLUCONATE CLOTH 2 % EX PADS
6.0000 | MEDICATED_PAD | Freq: Once | CUTANEOUS | Status: AC
  Administered 2020-09-18: 6 via TOPICAL

## 2020-09-17 MED ORDER — HEPARIN (PORCINE) 25000 UT/250ML-% IV SOLN
1050.0000 [IU]/h | INTRAVENOUS | Status: DC
  Administered 2020-09-18: 900 [IU]/h via INTRAVENOUS
  Administered 2020-09-19: 1100 [IU]/h via INTRAVENOUS
  Filled 2020-09-17 (×3): qty 250

## 2020-09-17 MED ORDER — BOOST / RESOURCE BREEZE PO LIQD CUSTOM
1.0000 | Freq: Three times a day (TID) | ORAL | Status: DC
  Administered 2020-09-17 – 2020-09-23 (×11): 1 via ORAL
  Filled 2020-09-17: qty 1

## 2020-09-17 MED ORDER — OXYCODONE HCL 5 MG PO TABS
5.0000 mg | ORAL_TABLET | ORAL | Status: DC | PRN
  Administered 2020-09-17 – 2020-09-19 (×4): 5 mg via ORAL
  Filled 2020-09-17 (×4): qty 1

## 2020-09-17 MED ORDER — CEFAZOLIN SODIUM-DEXTROSE 2-4 GM/100ML-% IV SOLN
2.0000 g | INTRAVENOUS | Status: AC
  Filled 2020-09-17: qty 100

## 2020-09-17 MED ORDER — CHLORHEXIDINE GLUCONATE CLOTH 2 % EX PADS
6.0000 | MEDICATED_PAD | Freq: Once | CUTANEOUS | Status: AC
  Administered 2020-09-17: 6 via TOPICAL

## 2020-09-17 MED ORDER — FLUCONAZOLE 100 MG PO TABS
100.0000 mg | ORAL_TABLET | Freq: Every day | ORAL | Status: DC
  Administered 2020-09-17: 100 mg via ORAL
  Filled 2020-09-17: qty 1

## 2020-09-17 MED ORDER — PANTOPRAZOLE SODIUM 40 MG IV SOLR
40.0000 mg | Freq: Two times a day (BID) | INTRAVENOUS | Status: DC
  Administered 2020-09-17 (×2): 40 mg via INTRAVENOUS
  Filled 2020-09-17 (×2): qty 40

## 2020-09-17 MED ORDER — FLUCONAZOLE 50 MG PO TABS
50.0000 mg | ORAL_TABLET | Freq: Every day | ORAL | Status: DC
  Filled 2020-09-17: qty 1

## 2020-09-17 NOTE — Progress Notes (Signed)
Rehab Admissions Coordinator Note:  Patient was screened by Clois Dupes for appropriateness for an Inpatient Acute Rehab Consult per PT recs. Noted patient is BCBS contracted with [facility] system. In network rehab facility will need to be consulted as we are non participating with his BCBS. Please call me with any questions. I have alerted TOC team, Ferd Hibbs and Toniann Fail, RN CM.  Clois Dupes RN MSN 09/17/2020, 12:24 PM  I can be reached at [phone].

## 2020-09-17 NOTE — Anesthesia Preprocedure Evaluation (Addendum)
Anesthesia Evaluation  Patient identified by MRN, date of birth, ID band Patient awake  General Assessment Comment:Somnolent but arousable and responsive to questions  Reviewed: Allergy & Precautions, NPO status , Patient's Chart, lab work & pertinent test results  Airway Mallampati: III  TM Distance: >3 FB Neck ROM: Full  Mouth opening: Limited Mouth Opening  Dental  (+) Teeth Intact   Pulmonary asthma , former smoker,    Pulmonary exam normal        Cardiovascular hypertension, Pt. on medications  Rhythm:Regular Rate:Normal     Neuro/Psych Muscle weakness negative psych ROS   GI/Hepatic negative GI ROS, Neg liver ROS,   Endo/Other  negative endocrine ROS  Renal/GU negative Renal ROS  negative genitourinary   Musculoskeletal negative musculoskeletal ROS (+)   Abdominal (+)  Abdomen: soft. Bowel sounds: normal.  Peds  Hematology negative hematology ROS (+)   Anesthesia Other Findings   Reproductive/Obstetrics                            Anesthesia Physical Anesthesia Plan  ASA: IV  Anesthesia Plan: MAC   Post-op Pain Management:    Induction: Intravenous  PONV Risk Score and Plan: 1 and Ondansetron, Dexamethasone, Propofol infusion, Midazolam and Treatment may vary due to age or medical condition  Airway Management Planned: Simple Face Mask, Natural Airway and Nasal Cannula  Additional Equipment: None  Intra-op Plan:   Post-operative Plan:   Informed Consent: I have reviewed the patients History and Physical, chart, labs and discussed the procedure including the risks, benefits and alternatives for the proposed anesthesia with the patient or authorized representative who has indicated his/her understanding and acceptance.     Dental advisory given  Plan Discussed with: CRNA  Anesthesia Plan Comments: (ECHO 04/22: 1. Global hypokinesis. The anteroseptal wall and apex are  akinetic. The  anterior wall is severely hypokinetic.   There is a very large layered semi-mobil apical thrombus extending  from the apex along the anteroseptal wall, length extends from the apex to  mid ventricle. . Left ventricular ejection fraction, by estimation, is  15-20%. The left ventricle has  severely decreased function. The left ventricle demonstrates global  hypokinesis. The left ventricular internal cavity size was mildly dilated.  Left ventricular diastolic parameters are indeterminate.  2. Right ventricular systolic function mild to moderately reduced. The  right ventricular size is normal.  3. The mitral valve is abnormal. Mild mitral valve regurgitation. No  evidence of mitral stenosis.  4. The aortic valve is tricuspid. There is mild calcification of the  aortic valve. There is mild thickening of the aortic valve. Aortic valve  regurgitation is not visualized. No aortic stenosis is present.  Lab Results      Component                Value               Date                      WBC                      35.2 (H)            09/21/2020                HGB  7.9 (L)             09/03/2020                HCT                      24.6 (L)            09/07/2020                MCV                      91.8                09/21/2020                PLT                      73 (L)              09/03/2020           Lab Results      Component                Value               Date                      NA                       146 (H)             09/06/2020                K                        4.1                 09/24/2020                CO2                      30                  09/23/2020                GLUCOSE                  127 (H)             08/30/2020                BUN                      80 (H)              09/23/2020                CREATININE               1.77 (H)            09/25/2020                CALCIUM                  7.6 (L)              09/27/2020  GFRNONAA                 45 (L)              09/11/2020                GFRAA                    54 (L)              11/11/2013          )      Anesthesia Quick Evaluation

## 2020-09-17 NOTE — Progress Notes (Signed)
ANTICOAGULATION CONSULT NOTE   Pharmacy Consult for Heparin Indication: LV mural thrombus and NSTEMI  Labs: Recent Labs    09/15/20 0321 09/15/20 0836 09/15/20 1104 09/16/20 0501 09/16/20 1240 09/16/20 1738 09/16/20 2106 09/17/20 0500 09/17/20 0504 09/17/20 0804  HGB 12.3*  --   --  9.4* 8.3* 8.4* 8.7* 8.7*  --   --   HCT 36.0*  --   --  27.7* 25.4* 25.6* 26.5* 25.6*  --   --   PLT 103*  --   --  96* 75* 72*  --  69*  --   --   LABPROT  --   --   --   --   --   --   --   --   --  17.1*  INR  --   --   --   --   --   --   --   --   --  1.4*  HEPARINUNFRC 0.42  --  0.38 0.36  --   --   --  <0.10*  --   --   CREATININE 1.99*  --   --  1.88*  --   --   --  1.97*  --   --   CKTOTAL  --  4,025*  --   --   --   --   --   --  3,219*  --   CKMB  --  14.1*  --   --   --   --   --   --   --   --    Assessment: 55 y.o. M presents with AMS. Pharmacy consulted for heparin for NSTEMI. No AC PTA. H/H ok on admission, plt low at 121.  No obvious source of bleeding identified, CT negative for bleeding. Hgb stable in 8s, plt have trended down to 69. Fibrinogen 408, LDH elevated at 630. Given apical thrombus on echo, discussed with CCM and cards and will restart heparin.   Goal of Therapy:  Heparin level 0.3-0.7 units/ml Monitor platelets by anticoagulation protocol: Yes   Plan:  Restart heparin at 900 units/hr Monitor daily HL, CBC/plt Monitor for signs/symptoms of bleeding   Thanks for allowing pharmacy to be a part of this patient's care.  Sheppard Coil PharmD., BCPS Clinical Pharmacist 09/17/2020 1:33 PM

## 2020-09-17 NOTE — TOC Progression Note (Addendum)
Transition of Care (TOC) - Progression Note  Heart Failure   Patient Details  Name: Dennis Howard. MRN: [id number] Date of Birth: [birth year]  Transition of Care [facility]) CM/SW Contact  [place] Souter, LCSWA Phone Number: 09/17/2020, 12:46 PM  Clinical Narrative:    CSW received a message from the CIR Rehab Admission Coordinator stating that the patients health insurance is not in network for CIR at [facility]. CSW sent a message to the CIR representative at [facility] to see if they are able to take the patient for rehab there. CSW is awaiting response.  1:00pm: CSW heard from [facility] Wendi who reported that Jace with [facility] CIR is also not in network with the patients insurance. NCM Wendi provided CSW with [employer] and [employer] CIR contact information to follow up.  CSW will await for an OT consult and recommendation before reaching out to CIR in [facility] and [place]. CSW will also wait for the patient to not be so acute to refrain from receiving denials from CIR.   TOC will continue to follow for d/c needs.      Barriers to Discharge: Continued Medical Work up  Expected Discharge Plan and Services   In-house Referral: Clinical Social Work     Living arrangements for the past 2 months: Apartment                                       Social Determinants of Health (SDOH) Interventions Food Insecurity Interventions: Assist with [employer] Application Financial Strain Interventions: Intervention Not Indicated,Other (Comment) (Provided CSW name and contact info. and will provide resources to patient and for them to follow up with HF clinic upon d/c) Housing Interventions: Intervention Not Indicated Transportation Interventions: Intervention Not Indicated  Readmission Risk Interventions No flowsheet data found.  Ailene Royal, MSW, LCSWA [phone] Heart Failure Social Worker

## 2020-09-17 NOTE — Evaluation (Signed)
Physical Therapy Evaluation Patient Details Name: Dennis Howard. MRN: [id number] DOB: 21-Jan-1966 Today's Date: 09/17/2020   History of Present Illness  55 y/o man who presented to [facility] with  AMS, cardiogenic + septic shock in setting of PNA + hepatic encephalopathy. Also with LV thrombus and NSTEMI.     Marland Kitchen  He has associated lower extremity swelling, weight gain, shortness of breath, and cough PMH: chronic kidney disease, hypertension, Marijuana daily for 20 + years  Clinical Impression  Pt admitted with above diagnosis. Pt was able to tolerated Egress in bed in chair position for 10 min. Pt performed exercises as well.  Confusion limiting treatment. Will progresss pt as able.  Pt currently with functional limitations due to the deficits listed below (see PT Problem List). Pt will benefit from skilled PT to increase their independence and safety with mobility to allow discharge to the venue listed below.      Follow Up Recommendations CIR    Equipment Recommendations  Rolling walker with 5" wheels;3in1 (PT)    Recommendations for Other Services       Precautions / Restrictions Precautions Precautions: Fall Restrictions Weight Bearing Restrictions: No      Mobility  Bed Mobility Overal bed mobility: Needs Assistance             General bed mobility comments: Egressed bed to full chair position with pt able to lean forward and sit up with back off bed for a few minutes.  Exercises as well. Limited due to pt cognition and fatigue    Transfers                 General transfer comment: TBA  Ambulation/Gait                Stairs            Wheelchair Mobility    Modified Rankin (Stroke Patients Only)       Balance Overall balance assessment: Needs assistance Sitting-balance support: Bilateral upper extremity supported;Feet unsupported Sitting balance-Leahy Scale: Poor Sitting balance - Comments: relies on UE support                                      Pertinent Vitals/Pain Pain Assessment: No/denies pain    Home Living Family/patient expects to be discharged to:: Private residence Living Arrangements: Parent Available Help at Discharge: Family;Available PRN/intermittently (mom lives with pt and dad can help but doesnt live with pt and mom) Type of Home: House Home Access: Level entry     Home Layout: Multi-level;Bed/bath upstairs (2 + 6) Home Equipment: None      Prior Function Level of Independence: Independent         Comments: Drove for Door Dash     Hand Dominance   Dominant Hand: Right    Extremity/Trunk Assessment   Upper Extremity Assessment Upper Extremity Assessment: Defer to OT evaluation    Lower Extremity Assessment Lower Extremity Assessment: RLE deficits/detail;LLE deficits/detail RLE Deficits / Details: grossly 3-/5 LLE Deficits / Details: grossly 3-/5       Communication   Communication: No difficulties  Cognition Arousal/Alertness: Awake/alert Behavior During Therapy: Flat affect (kept eyes closed entire eval) Overall Cognitive Status: Impaired/Different from baseline Area of Impairment: Orientation;Memory;Following commands;Safety/judgement;Awareness;Problem solving                 Orientation Level: Disoriented to;Situation;Time;Place  Following Commands: Follows one step commands inconsistently;Follows one step commands with increased time Safety/Judgement: Decreased awareness of safety;Decreased awareness of deficits   Problem Solving: Slow processing;Decreased initiation;Difficulty sequencing;Requires verbal cues;Requires tactile cues        General Comments General comments (skin integrity, edema, etc.): VSS    Exercises General Exercises - Upper Extremity Shoulder Flexion: AROM;Both;5 reps;Supine Elbow Flexion: AROM;Both;10 reps;Supine Elbow Extension: AROM;Both;10 reps;Supine General Exercises - Lower Extremity Ankle  Circles/Pumps: AROM;Both;10 reps;Supine Long Arc Quad: AAROM;Both;10 reps;Seated Hip Flexion/Marching: AAROM;Both;10 reps;Seated   Assessment/Plan    PT Assessment Patient needs continued PT services  PT Problem List Decreased activity tolerance;Decreased balance;Decreased mobility;Decreased knowledge of use of DME;Decreased safety awareness;Decreased strength;Decreased range of motion;Decreased knowledge of precautions;Cardiopulmonary status limiting activity       PT Treatment Interventions DME instruction;Gait training;Functional mobility training;Therapeutic activities;Therapeutic exercise;Balance training;Patient/family education    PT Goals (Current goals can be found in the Care Plan section)  Acute Rehab PT Goals Patient Stated Goal: to go home PT Goal Formulation: With patient Time For Goal Achievement: 10/01/20 Potential to Achieve Goals: Good    Frequency Min 3X/week   Barriers to discharge        Co-evaluation               AM-PAC PT "6 Clicks" Mobility  Outcome Measure Help needed turning from your back to your side while in a flat bed without using bedrails?: A Lot Help needed moving from lying on your back to sitting on the side of a flat bed without using bedrails?: A Lot Help needed moving to and from a bed to a chair (including a wheelchair)?: A Lot Help needed standing up from a chair using your arms (e.g., wheelchair or bedside chair)?: A Lot Help needed to walk in hospital room?: A Lot Help needed climbing 3-5 steps with a railing? : A Lot 6 Click Score: 12    End of Session Equipment Utilized During Treatment: Gait belt;Oxygen Activity Tolerance: Patient limited by fatigue Patient left: in bed;with call bell/phone within reach;with bed alarm set;with family/visitor present (bed in chair position) Nurse Communication: Mobility status PT Visit Diagnosis: Unsteadiness on feet (R26.81);Muscle weakness (generalized) (M62.81)    Time: 1015-1040 PT  Time Calculation (min) (ACUTE ONLY): 25 min   Charges:   PT Evaluation $PT Eval Moderate Complexity: 1 Mod PT Treatments $Therapeutic Exercise: 8-22 mins        Adolph Clutter M,PT Acute Rehab Services [phone] (pager)  Bevelyn Buckles 09/17/2020, 12:06 PM

## 2020-09-17 NOTE — TOC Initial Note (Signed)
Transition of Care (TOC) - Initial/Assessment Note  Heart Failure   Patient Details  Name: Dennis Howard. MRN: [id number] Date of Birth: 03-11-1966  Transition of Care [facility]) CM/SW Contact:    Taiwo Fish, LCSWA Phone Number: 09/17/2020, 12:26 PM  Clinical Narrative:           CSW spoke with the patient and his mother at bedside and completed very brief SDOH screening with the patient who reported having some needs. Patient reported they do have a PCP and they can get to the pharmacy to pick up their medications. CSW obtained their signature for the Food Stamp referral for the [facility] to reach out to them as patient indicated that would be helpful. CSW faxed over the Food Stamp application and informed the patient that the [facility] will be in touch. Patients mother reported that he lives with her and that she provides transportation for him but that he would need help with some bills like his cell-phone, health and car insurance. CSW will provide resources to the patient and his mom and encouraged them to follow up with the HF outpatient clinic upon discharge as there is a Child psychotherapist at the clinic who can provide assistance as well. CSW provided the patient and his mother with the social workers name, number and position and to reach out to CSW as other social needs arise.  TOC will continue to follow for d/c needs.           Barriers to Discharge: Continued Medical Work up   Patient Goals and CMS Choice        Expected Discharge Plan and Services   In-house Referral: Clinical Social Work     Living arrangements for the past 2 months: Apartment                                      Prior Living Arrangements/Services Living arrangements for the past 2 months: Apartment Lives with:: Self,Parents Patient language and need for interpreter reviewed:: Yes Do you feel safe going back to the place where you live?: Yes      Need for Family Participation in  Patient Care: No (Comment) Care giver support system in place?: Yes (comment)   Criminal Activity/Legal Involvement Pertinent to Current Situation/Hospitalization: No - Comment as needed  Activities of Daily Living      Permission Sought/Granted                  Emotional Assessment Appearance:: Appears stated age Attitude/Demeanor/Rapport: Engaged Affect (typically observed): Quiet Orientation: : Oriented to Self,Oriented to Place,Oriented to  Time,Oriented to Situation Alcohol / Substance Use: Not Applicable Psych Involvement: No (comment)  Admission diagnosis:  Hyperkalemia [E87.5] Hyperbilirubinemia [E80.6] Liver failure (HCC) [K72.90] Leg edema [R60.0] Encephalopathy acute [G93.40] Community acquired pneumonia, unspecified laterality [J18.9] Sepsis, due to unspecified organism, unspecified whether acute organ dysfunction present [facility]) [A41.9] Patient Active Problem List   Diagnosis Date Noted  . Encephalopathy acute Oct 09, 2020  . CKD (chronic kidney disease), stage III (HCC) 03/02/2012  . Bronchitis, acute, with bronchospasm 03/02/2012  . Dyspnea 02/29/2012  . Asthma exacerbation 02/29/2012  . HTN (hypertension), malignant 02/29/2012  . Allergic rhinitis 02/29/2012   PCP:  Leilani Able, MD Pharmacy:   [facility] [phone] - [place], [place] - [phone] AT [facility] & [address] [phone] [place]  [id number] Phone: [phone] Fax: [phone]     Social Determinants of Health (SDOH) Interventions Food Insecurity Interventions: Assist with [employer] Application Financial Strain Interventions: Intervention Not Indicated,Other (Comment) (Provided CSW name and contact info. and will provide resources to patient and for them to follow up with HF clinic upon d/c) Housing Interventions: Intervention Not Indicated Transportation Interventions: Intervention Not Indicated  Readmission Risk Interventions No flowsheet data  found.  Rector Devonshire, MSW, LCSWA [phone] Heart Failure Social Worker

## 2020-09-17 NOTE — Consult Note (Signed)
New Hematology/Oncology Consult   Requesting MD: Melody Comas       Reason for Consult: Anemia, thrombocytopenia  HPI: Dennis Howard was admitted via the emergency room on 09/24/2020 with altered mental status and new onset lower extremity edema.  The history is from the patient, medical chart, and his mother. His mother mother reports he developed a cough approximately 1 month ago.  He was seen in the emergency room 08/26/2020 with dyspnea.  He was noted to have an abnormal lung exam and a chest CT revealed evidence of pneumonia.  He was discharged home with doxycycline.  On hospital admission 09/03/2020 he was noted to have an elevated ammonia level and marked elevation of the liver enzymes and bilirubin.  He was admitted for further evaluation.  The CPK was elevated.  An echocardiogram on 09/14/2020 revealed global hypokinesis with a large left ventricular thrombus.  The LVEF was estimated at 15-20%.  The right ventricular systolic function is mildly to moderately reduced.  He was placed on broad-spectrum antibiotics and heparin.  Cardiology was consulted.  He was diagnosed with cardiogenic shock.  He was placed on dobutamine and diuresis.   The hemoglobin dropped acutely from 09/15/2020 to 09/16/2020.  No evidence of bleeding.  Heparin was placed on hold.  He was transfused 1 unit packed red blood cells on 09/16/2020.    Dennis Howard complains of foot pain and tenderness.  No other complaint.  His mother reports he is more alert.  She says he had "pus "draining from a leg ulcer prior to hospital admission.     Past Medical History:  Diagnosis Date  . Bronchitis   . Environmental allergies   . Hypertension   :  .  Chronic renal failure  History reviewed. No pertinent surgical history.:   Current Facility-Administered Medications:  .  [START ON Oct 01, 2020] ceFAZolin (ANCEF) IVPB 2g/100 mL premix, 2 g, Intravenous, On Call to OR, Meuth, Brooke A, PA-C .  ceFEPIme (MAXIPIME) 2 g in  sodium chloride 0.9 % 100 mL IVPB, 2 g, Intravenous, Q12H, Leander Rams, RPH, Stopped at 09/17/20 0830 .  Chlorhexidine Gluconate Cloth 2 % PADS 6 each, 6 each, Topical, Daily, Westly Pam, DO, 6 each at 09/17/20 0945 .  6 CHG cloth bath night before surgery, , , Once **AND** [START ON 01-Oct-2020] 6 CHG cloth bath AM of surgery, , , Once **AND** Chlorhexidine Gluconate Cloth 2 % PADS 6 each, 6 each, Topical, Once **AND** Chlorhexidine Gluconate Cloth 2 % PADS 6 each, 6 each, Topical, Once, Meuth, Brooke A, PA-C .  DOBUTamine (DOBUTREX) infusion 4000 mcg/mL, 7.5 mcg/kg/min, Intravenous, Titrated, Simmons, Brittainy M, PA-C, Last Rate: 9.28 mL/hr at 09/17/20 1440, 7.5 mcg/kg/min at 09/17/20 1440 .  docusate (COLACE) 50 MG/5ML liquid 100 mg, 100 mg, Per Tube, BID PRN, Elita Boone F, DO .  feeding supplement (BOOST / RESOURCE BREEZE) liquid 1 Container, 1 Container, Oral, TID BM, Melody Comas B, MD .  furosemide (LASIX) injection 40 mg, 40 mg, Intravenous, BID, Bensimhon, Bevelyn Buckles, MD, 40 mg at 09/17/20 0754 .  heparin ADULT infusion 100 units/mL (25000 units/233mL), 900 Units/hr, Intravenous, Continuous, Earnie Larsson, [facility], Held at 09/17/20 1432 .  magic mouthwash, 10 mL, Oral, TID, Ollis, Brandi L, NP, 10 mL at 09/17/20 1051 .  multivitamin with minerals tablet 1 tablet, 1 tablet, Oral, Daily, Dewald, Jonathan B, MD .  oxyCODONE (Oxy IR/ROXICODONE) immediate release tablet 5 mg, 5 mg, Oral, Q4H PRN, Martina Sinner, MD, 5  mg at 09/17/20 1436 .  pantoprazole (PROTONIX) injection 40 mg, 40 mg, Intravenous, Q12H, Martina Sinner, MD, 40 mg at 09/17/20 0949 .  sodium chloride flush (NS) 0.9 % injection 10-40 mL, 10-40 mL, Intracatheter, Q12H, Martina Sinner, MD, 10 mL at 09/17/20 0949 .  sodium chloride flush (NS) 0.9 % injection 10-40 mL, 10-40 mL, Intracatheter, PRN, Martina Sinner, MD:  . Chlorhexidine Gluconate Cloth  6 each Topical Daily  . Chlorhexidine Gluconate Cloth  6  each Topical Once   And  . Chlorhexidine Gluconate Cloth  6 each Topical Once  . feeding supplement  1 Container Oral TID BM  . furosemide  40 mg Intravenous BID  . magic mouthwash  10 mL Oral TID  . multivitamin with minerals  1 tablet Oral Daily  . pantoprazole (PROTONIX) IV  40 mg Intravenous Q12H  . sodium chloride flush  10-40 mL Intracatheter Q12H  :  No Known Allergies:  FH: A maternal aunt and maternal cousin have a history of breast cancer  SOCIAL HISTORY: He lives with his mother.  He works as an Biomedical scientist, for [employer], and in his Secretary/administrator business.  He reports occasional alcohol use.  He smokes marijuana daily.  No cigarette use.  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  Review of Systems:  Positives include: Cough beginning approximately 1 month prior to hospital admission, pain in the feet  A complete ROS was otherwise negative.   Physical Exam:  Blood pressure 113/82, pulse (!) 108, temperature 98.42 F (36.9 C), resp. rate 18, height 6' (1.829 m), weight 167 lb 8.8 oz (76 kg), SpO2 100 %.  HEENT: Thrush at the buccal mucosa and upper pharynx, ulcerations over the tongue Lungs: Clear bilaterally, no respiratory distress Cardiac: Regular rate and rhythm, tachycardia Abdomen: Nontender, no hepatosplenomegaly GU: Testes without mass Vascular: Pitting edema at the lower leg and foot bilaterally with Ace wraps in place Lymph nodes: No cervical, supraclavicular, axillary, or inguinal nodes Neurologic: Alert, follows commands, oriented to place, difficulty recalling home address and phone number.  Moves all extremities to command Skin: Purpuric areas at the distal right toes Musculoskeletal: Tender over the toes bilaterally  LABS:  Recent Labs    09/16/20 1738 09/16/20 2106 09/17/20 0500  WBC 29.8*  --  35.1*  HGB 8.4* 8.7* 8.7*  HCT 25.6* 26.5* 25.6*  PLT 72*  --  69*  Blood smear: I reviewed blood smears from 09/16/2020  and 09/17/2020: The white cells are increased in number, the majority the white cells are mature neutrophils.  No blasts or other young forms are seen.  The platelets appear decreased in number.  Few small platelet clumps.  There are target cells, burr cells, ovalocytes, and a few schistocytes/helmet cells.  The polychromasia is not increased.  No nucleated red cells are seen.   Recent Labs    09/16/20 0501 09/17/20 0500  NA 145 145  K 4.5 4.5  CL 112* 110  CO2 28 28  GLUCOSE 132* 140*  BUN 97* 98*  CREATININE 1.88* 1.97*  CALCIUM 7.5* 7.5*      RADIOLOGY:  CT ABDOMEN PELVIS WO CONTRAST  Result Date: 09/16/2020 CLINICAL DATA:  Question of retroperitoneal hematoma. EXAM: CT ABDOMEN AND PELVIS WITHOUT CONTRAST TECHNIQUE: Multidetector CT imaging of the abdomen and pelvis was performed following the standard protocol without IV contrast. COMPARISON:  CT chest from August 26, 2020 and CT of the abdomen and pelvis dated  September 13, 2020 FINDINGS: Lower chest: RIGHT basilar and medial consolidative changes and costodiaphragmatic nodularity with similar appearance in the RIGHT chest. The LEFT basilar ground-glass above the LEFT hemidiaphragm also similar. No effusion. Decreased attenuation in cardiac blood pool compatible with anemia. Hepatobiliary: Arm position limits assessment of upper abdominal viscera. Liver contour is normal. No pericholecystic stranding. No biliary duct dilation. Pancreas: Pancreas is unremarkable grossly without contour abnormality or sign of inflammation. Spleen: Spleen normal size and contour. Adrenals/Urinary Tract: Adrenal glands are normal. Smooth renal contours. No hydronephrosis. No nephrolithiasis. Small amount of gas in the urinary bladder. Foley catheter in situ. Stomach/Bowel: Small bowel and stomach without acute process. Appendix not clearly seen. No secondary sign to suggest acute appendicitis. Vascular/Lymphatic: Calcified atheromatous plaque of the abdominal  aorta. No aneurysmal dilation. Smooth contour of the IVC. There is no gastrohepatic or hepatoduodenal ligament lymphadenopathy. No retroperitoneal or mesenteric lymphadenopathy. No pelvic sidewall lymphadenopathy. Reproductive: Grossly unremarkable.  Foley catheter in place. Other: Body wall edema of the bilateral lower extremities and LEFT and RIGHT lower flank and gluteal regions perhaps slightly worse than on previous imaging, incompletely assessed. Musculoskeletal: Spinal degenerative changes. No acute or destructive bone process. IMPRESSION: 1. No evidence of retroperitoneal hematoma. 2. RIGHT basilar and medial consolidative changes and costodiaphragmatic nodularity with similar appearance in the RIGHT chest. The LEFT basilar ground-glass above the LEFT hemidiaphragm also similar. Findings are again suggestive of multifocal pneumonia. Lower extremity edema. Not well assessed, perhaps slightly worse. 3. No signs of retroperitoneal hematoma 4. Aortic atherosclerosis. Aortic Atherosclerosis (ICD10-I70.0). Electronically Signed   By: Donzetta Kohut M.D.   On: 09/16/2020 15:57   CT Abdomen Pelvis Wo Contrast  Result Date: 09/27/2020 CLINICAL DATA:  Abdominal pain and lower extremity edema EXAM: CT ABDOMEN AND PELVIS WITHOUT CONTRAST TECHNIQUE: Multidetector CT imaging of the abdomen and pelvis was performed following the standard protocol without IV contrast. COMPARISON:  None. FINDINGS: Lower chest: Lung bases show consolidation and infiltrate in the medial right lower lobe, new from the prior exam. Patchy infiltrate is noted in the left lower lobe as well. Previously seen nodular density has nearly completely resolved in the interval from the prior exam. Some nodularity is noted in the lateral costophrenic angle on the right measuring 19 mm in greatest dimension likely postinflammatory in nature given the abrupt onset from the prior exam. Hepatobiliary: No focal liver abnormality is seen. No gallstones,  gallbladder wall thickening, or biliary dilatation. Pancreas: Unremarkable. No pancreatic ductal dilatation or surrounding inflammatory changes. Spleen: Normal in size without focal abnormality. Adrenals/Urinary Tract: Adrenal glands are within normal limits. No renal calculi or obstructive changes are seen. Ureters are within normal limits. The bladder is well distended. Stomach/Bowel: The appendix is well visualized and within normal limits. No obstructive or inflammatory changes of the colon are seen. Small bowel and stomach are unremarkable. Vascular/Lymphatic: Aortic atherosclerosis. No enlarged abdominal or pelvic lymph nodes. Reproductive: Prostate is unremarkable. Other: No abdominal wall hernia or abnormality. No abdominopelvic ascites. Musculoskeletal: Bony structures are within normal limits. Mild lower extremity edema is noted about the hips. IMPRESSION: Progressive consolidation in the right lower lobe medially with a somewhat nodular area of consolidation in the lateral right costophrenic angle. These changes are consistent with progressive pneumonia. Patchy somewhat nodular infiltrate is noted in the left lower lobe. Previously seen nodular density has nearly completely resolved consistent with postinflammatory change. Mild lower extremity edema. Electronically Signed   By: Alcide Clever M.D.   On: 09/15/2020 19:49  DG Chest 2 View  Result Date: 08/26/2020 CLINICAL DATA:  Wheezing, shortness of breath. EXAM: CHEST - 2 VIEW COMPARISON:  November 11, 2013. FINDINGS: The heart size and mediastinal contours are within normal limits. No pneumothorax pleural effusion is noted. Left lung is clear. New right perihilar opacity is noted which may represent pneumonia, but neoplasm cannot be excluded. The visualized skeletal structures are unremarkable. IMPRESSION: New right perihilar opacity is noted which may represent pneumonia, but neoplasm cannot be excluded. CT scan of the chest is recommended for further  evaluation. Electronically Signed   By: Lupita Raider M.D.   On: 08/26/2020 16:44   CT Head Wo Contrast  Result Date: 09/19/2020 CLINICAL DATA:  Altered mental status EXAM: CT HEAD WITHOUT CONTRAST TECHNIQUE: Contiguous axial images were obtained from the base of the skull through the vertex without intravenous contrast. COMPARISON:  None. FINDINGS: Brain: No acute intracranial abnormality. Specifically, no hemorrhage, hydrocephalus, mass lesion, acute infarction, or significant intracranial injury. Vascular: No hyperdense vessel or unexpected calcification. Skull: No acute calvarial abnormality. Sinuses/Orbits: Mucosal thickening in the maxillary sinuses. Other: None IMPRESSION: No acute intracranial abnormality. Electronically Signed   By: Charlett Nose M.D.   On: 09/25/2020 19:36   CT CHEST WO CONTRAST  Result Date: 09/17/2020 CLINICAL DATA:  Pneumonia. EXAM: CT CHEST WITHOUT CONTRAST TECHNIQUE: Multidetector CT imaging of the chest was performed following the standard protocol without IV contrast. COMPARISON:  August 26, 2020. FINDINGS: Cardiovascular: No significant vascular findings. Normal heart size. No pericardial effusion. Mediastinum/Nodes: No enlarged mediastinal or axillary lymph nodes. Thyroid gland, trachea, and esophagus demonstrate no significant findings. There is noted soft tissue gas in the right supraclavicular region which may be related to insertion of right internal jugular catheter, but infection cannot be excluded. Lungs/Pleura: No pneumothorax or pleural effusion is noted. Right lower lobe airspace opacity is noted concerning for pneumonia. Multiple ill-defined opacities are noted in the right upper lobe as well as in the left lower lobe as noted on prior exam, most consistent with multifocal pneumonia. Upper Abdomen: No acute abnormality. Musculoskeletal: No chest wall mass or suspicious bone lesions identified. IMPRESSION: Multiple opacities are noted in both lungs consistent  with multifocal pneumonia. Largest opacity is seen in the right lower lobe, most consistent with pneumonia. Soft tissue gas is noted in the right supraclavicular region which most likely is related to insertion of right internal jugular catheter, but infection cannot be excluded. Electronically Signed   By: Lupita Raider M.D.   On: 09/17/2020 13:28   CT Angio Chest PE W and/or Wo Contrast  Result Date: 08/26/2020 CLINICAL DATA:  Wheezing and shortness of breath. Concern for pneumonia. Concern for pulmonary embolus. EXAM: CT ANGIOGRAPHY CHEST WITH CONTRAST TECHNIQUE: Multidetector CT imaging of the chest was performed using the standard protocol during bolus administration of intravenous contrast. Multiplanar CT image reconstructions and MIPs were obtained to evaluate the vascular anatomy. CONTRAST:  60mL OMNIPAQUE IOHEXOL 350 MG/ML SOLN COMPARISON:  cxr/28/22 FINDINGS: Cardiovascular: Satisfactory opacification of the pulmonary arteries to the segmental level. No evidence of pulmonary embolism. Enlarged left ventricle. No pericardial effusion. Mediastinum/Nodes: Bilateral hilar lymphadenopathy. No enlarged mediastinal or axillary lymph nodes. Thyroid gland, trachea, and esophagus demonstrate no significant findings. Lungs/Pleura: Interval development of peribronchovascular and peripheral patchy consolidations. Interval development of a 9 mm nodular like density at the left base. No cavitation. Trace to small volume right pleural effusion. No pneumothorax. Upper Abdomen: No acute abnormality. Musculoskeletal: No chest wall abnormality. No acute  or significant osseous findings. Review of the MIP images confirms the above findings. IMPRESSION: 1. No pulmonary embolus. 2. Findings suggestive of multifocal pneumonia with a trace to small volume right pleural effusion. 3. Associated interval development of a 9 mm nodular-like density at the left base. Non-contrast chest CT at 3-6 months is recommended. If nodules  persist, subsequent management will be based upon the most suspicious nodule(s). This recommendation follows the consensus statement: Guidelines for Management of Incidental Pulmonary Nodules Detected on CT Images: From the Fleischner Society 2017; Radiology 2017; 284:228-243. 4. Associated bilateral hilar lymphadenopathy that is likely reactive in etiology. 5. Enlarged left ventricle. Electronically Signed   By: Tish FredericksonMorgane  Naveau M.D.   On: 08/26/2020 20:32   DG CHEST PORT 1 VIEW  Result Date: 09/14/2020 CLINICAL DATA:  Check central line placement EXAM: PORTABLE CHEST 1 VIEW COMPARISON:  09/24/2020 FINDINGS: Cardiac shadow is stable. Gastric catheter is noted extending into the stomach. Temporary dialysis catheter is noted in the right jugular vein with the tip at the cavoatrial junction. No pneumothorax is seen. The lungs are clear bilaterally. No bony abnormality is seen. IMPRESSION: No pneumothorax following central line placement. Electronically Signed   By: Alcide CleverMark  Lukens M.D.   On: 09/14/2020 15:28   DG Chest Port 1 View  Result Date: 09/25/2020 CLINICAL DATA:  Shortness of breath, leg swelling EXAM: PORTABLE CHEST 1 VIEW COMPARISON:  08/26/2020 FINDINGS: Cardiomegaly. No confluent opacities, effusions or edema. No acute bony abnormality. IMPRESSION: Cardiomegaly.  No active disease. Electronically Signed   By: Charlett NoseKevin  Dover M.D.   On: 09/26/2020 17:10   DG Abd Portable 1V  Result Date: 09/14/2020 CLINICAL DATA:  Transesophageal tube placement EXAM: PORTABLE ABDOMEN - 1 VIEW COMPARISON:  CT 08/31/2020 FINDINGS: Transesophageal tube has been advanced, coiling within the gastric lumen with the tip positioned near the gastric antrum. Persistent air distention of the bowel in the upper abdomen. Enlarged, lobular cardiac silhouette unchanged from prior compatible cardiomegaly seen on comparison CT. Patchy right retrocardiac opacity compatible with airspace disease in the right lower lobe with additional  right midlung opacity as well. IMPRESSION: 1. Transesophageal tube has been advanced, coiling within the gastric lumen with the tip positioned near the gastric antrum. 2. Persistent air distention of the bowel in the upper abdomen. Correlate for features of obstruction or ileus. 3. Patchy airspace opacities in the right lung base and right midlung, minimal opacity in the left lung. Electronically Signed   By: Kreg ShropshirePrice  DeHay M.D.   On: 09/14/2020 06:45   DG Abd Portable 1V  Result Date: 09/14/2020 CLINICAL DATA:  NG tube placement EXAM: PORTABLE ABDOMEN - 1 VIEW COMPARISON:  09/14/2020 FINDINGS: NG tube tip and side port project over the stomach. The side port is just past the gastroesophageal junction. Consider advancing 3-5 more cm. IMPRESSION: NG tube tip and side port project over the stomach, with side port just past the gastroesophageal junction. Electronically Signed   By: Deatra RobinsonKevin  Herman M.D.   On: 09/14/2020 02:01   DG Abd Portable 1V  Result Date: 09/14/2020 CLINICAL DATA:  NG tube placement EXAM: PORTABLE ABDOMEN - 1 VIEW COMPARISON:  None. FINDINGS: Nasogastric tube appears folded at the side port and redirected superiorly towards the mid thoracic esophagus. Recommend complete retraction and repositioning. Please note, this is not a full radiograph of the chest or abdomen. Lung bases are clear. Multiple air-filled loops of bowel in the upper abdomen appear colonic with a moderate colonic stool burden towards the hepatic flexure  as well. IMPRESSION: 1. Nasogastric tube appears folded at the side port and redirected superiorly towards the mid thoracic esophagus. Recommend complete retraction and repositioning. 2. Please note, this is not a full radiograph of the chest or abdomen. 3. Multiple air-filled loops of bowel in the upper abdomen, correlate with abdominal symptoms. Electronically Signed   By: Kreg Shropshire M.D.   On: 09/14/2020 01:29   ECHOCARDIOGRAM COMPLETE  Result Date: 09/14/2020     ECHOCARDIOGRAM REPORT   Patient Name:   Dennis Howard. Date of Exam: 09/14/2020 Medical Rec #:  [id number]        Height:       72.0 in Accession #:    [id number]       Weight:       181.9 lb Date of Birth:  09-12-1965       BSA:          2.046 m Patient Age:    54 years         BP:           130/102 mmHg Patient Gender: M                HR:           109 bpm. Exam Location:  Inpatient Procedure: 2D Echo, Cardiac Doppler and Color Doppler Indications:    I50.9* Heart failure (unspecified)  History:        Patient has prior history of Echocardiogram examinations, most                 recent 03/01/2012. CKD III, Signs/Symptoms:Shortness of Breath;                 Risk Factors:Hypertension.  Sonographer:    Roosvelt Maser RDCS Referring Phys: [id number] JULIAN F REESE IMPRESSIONS  1. Global hypokinesis. The anteroseptal wall and apex are akinetic. The anterior wall is severely hypokinetic.     There is a very large layered semi-mobil apical thrombus extending from the apex along the anteroseptal wall, length extends from the apex to mid ventricle. . Left ventricular ejection fraction, by estimation, is 15-20%. The left ventricle has severely decreased function. The left ventricle demonstrates global hypokinesis. The left ventricular internal cavity size was mildly dilated. Left ventricular diastolic parameters are indeterminate.  2. Right ventricular systolic function mild to moderately reduced. The right ventricular size is normal.  3. The mitral valve is abnormal. Mild mitral valve regurgitation. No evidence of mitral stenosis.  4. The aortic valve is tricuspid. There is mild calcification of the aortic valve. There is mild thickening of the aortic valve. Aortic valve regurgitation is not visualized. No aortic stenosis is present. FINDINGS  Left Ventricle: Global hypokinesis. The anteroseptal wall and apex are akinetic. The anterior wall is severely hypokinetic. There is a very large layered semi-mobil apical thrombus  extending from the apex along the anteroseptal wall, length extends from the apex to mid ventricle. Left ventricular ejection fraction, by estimation, is 15-20%. The left ventricle has severely decreased function. The left ventricle demonstrates global hypokinesis. The left ventricular internal cavity size was mildly dilated. There is no left ventricular hypertrophy. Left ventricular diastolic parameters are indeterminate. Right Ventricle: The right ventricular size is normal. Right vetricular wall thickness was not assessed. Right ventricular systolic function mild to moderately reduced. Left Atrium: Left atrial size was normal in size. Right Atrium: Right atrial size was normal in size. Pericardium: There is no evidence of pericardial effusion. Mitral Valve: The mitral  valve is abnormal. There is mild thickening of the mitral valve leaflet(s). There is mild calcification of the mitral valve leaflet(s). Mild mitral annular calcification. Mild mitral valve regurgitation. No evidence of mitral valve stenosis. Tricuspid Valve: The tricuspid valve is not well visualized. Tricuspid valve regurgitation is not demonstrated. No evidence of tricuspid stenosis. Aortic Valve: The aortic valve is tricuspid. There is mild calcification of the aortic valve. There is mild thickening of the aortic valve. There is mild aortic valve annular calcification. Aortic valve regurgitation is not visualized. No aortic stenosis  is present. Pulmonic Valve: The pulmonic valve was not well visualized. Pulmonic valve regurgitation is not visualized. No evidence of pulmonic stenosis. Aorta: The aortic root is normal in size and structure. Pulmonary Artery: Indeterminant PASP, inadequate TR jet. Venous: The inferior vena cava was not well visualized. IAS/Shunts: The interatrial septum was not well visualized.  LEFT VENTRICLE PLAX 2D LVIDd:         5.60 cm  Diastology LV PW:         1.00 cm  LV e' medial:   3.48 cm/s LV IVS:        0.90 cm  LV  E/e' medial: 10.6 LVOT diam:     1.80 cm LV SV:         12 LV SV Index:   6 LVOT Area:     2.54 cm  RIGHT VENTRICLE RV Basal diam:  3.00 cm TAPSE (M-mode): 0.5 cm LEFT ATRIUM             Index       RIGHT ATRIUM           Index LA diam:        4.00 cm 1.95 cm/m  RA Area:     19.90 cm LA Vol (A2C):   76.0 ml 37.14 ml/m RA Volume:   64.80 ml  31.67 ml/m LA Vol (A4C):   47.7 ml 23.31 ml/m LA Biplane Vol: 61.7 ml 30.15 ml/m  AORTIC VALVE LVOT Vmax:   37.20 cm/s LVOT Vmean:  22.700 cm/s LVOT VTI:    0.046 m  AORTA Ao Root diam: 2.90 cm MITRAL VALVE MV Area (PHT): 5.97 cm    SHUNTS MV Decel Time: 127 msec    Systemic VTI:  0.05 m MV E velocity: 36.80 cm/s  Systemic Diam: 1.80 cm MV A velocity: 73.30 cm/s MV E/A ratio:  0.50 Dina Rich MD Electronically signed by Dina Rich MD Signature Date/Time: 09/14/2020/12:52:00 PM    Final    US LIVER DOPPLER  Result Date: 09/14/2020 CLINICAL DATA:  Liver failure EXAM: DUPLEX ULTRASOUND OF LIVER TECHNIQUE: Color and duplex Doppler ultrasound was performed to evaluate the hepatic in-flow and out-flow vessels. COMPARISON:  CT abdomen pelvis 09/25/2020 FINDINGS: Liver: Mildly coarsened parenchymal echogenicity. Normal hepatic contour without nodularity. No focal lesion, mass or intrahepatic biliary ductal dilatation. Main Portal Vein size: 0.9 cm Portal Vein Velocities Main Prox:  22 cm/sec Main Mid: 24 cm/sec Main Dist:  22 cm/sec Right: 16 cm/sec Left: 20 cm/sec Hepatic Vein Velocities Right:  35 cm/sec Middle:  23 cm/sec Left:  44 cm/sec IVC: Present and patent with normal respiratory phasicity. Hepatic Artery Velocity:  47 cm/sec Splenic Vein Velocity:  18 cm/sec Spleen: 6.7 cm x 2.6 cm x 2.6 cm with a total volume of 237 cm^3 (411 cm^3 is upper limit normal) Portal Vein Occlusion/Thrombus: No Splenic Vein Occlusion/Thrombus: No Ascites: Trace Varices: None IMPRESSION: Patent portal vein with appropriate direction of flow.  Electronically Signed   By: Acquanetta Belling M.D.   On: 09/14/2020 09:42   US Abdomen Limited RUQ (LIVER/GB)  Result Date: 09/14/2020 CLINICAL DATA:  Liver failure EXAM: ULTRASOUND ABDOMEN LIMITED RIGHT UPPER QUADRANT COMPARISON:  CT 09/04/2020 FINDINGS: Gallbladder: Extensive biliary sludge. Unable to position in decubitus to assess for mobility. No significant gallbladder wall thickening. No pericholecystic fluid. Sonographic Eulah Pont sign is reportedly negative. Common bile duct: Diameter: 3.8 mm, nondilated. Much of the common bile duct is difficult to visualize however due to bowel gas. Liver: Some nonspecific heterogeneity of the liver parenchyma with normal to minimally increased echogenicity. Smooth liver surface contour. No focal liver lesion. No intrahepatic biliary ductal dilatation. Portal vein is patent on color Doppler imaging with normal direction of blood flow towards the liver. Other: Trace free fluid. Please note: Exam was performed with portable technique. Patient currently within the ICU, unable to the perform adequate breath hold or reposition in decubitus. IMPRESSION: 1. Extensive biliary sludge. No sonographic evidence of acute cholecystitis or biliary ductal dilatation. 2. Heterogeneous hepatic echogenicity, can be seen in the setting of intrinsic liver disease and/or steatosis. 3. Trace free fluid in the right upper quadrant. Electronically Signed   By: Kreg Shropshire M.D.   On: 09/14/2020 00:31    Assessment and Plan:   1.  Pulmonary infiltrates 2.  Chronic renal failure 3.  Cardiogenic shock on hospital admission 4.  Liver failure secondary to cardiogenic shock, underlying chronic liver disease? 5.  Anemia 6.  Thrombocytopenia 7.   Coagulopathy 8.   Altered mental status-hepatic encephalopathy? 9.   Rhabdomyolysis 10.  Oral candidiasis  Dennis Howard has a history of hypertension and chronic renal failure.  He is admitted with a cough and pulmonary infiltrates.  He is noted to have multiorgan failure with evidence  of congestive heart failure, congestive hepatopathy, and encephalopathy.  He has developed progressive anemia and thrombocytopenia during this hospital admission.  It is possible his clinical presentation is related to infection, but cultures and the remainder of an infectious disease work-up have been negative to date.  He could have a systemic inflammatory condition responsible for the pulmonary infiltrates, rhabdomyolysis, and heart failure.  The thrombocytopenia could be related to infection and liver disease.  I cannot explain the acute drop in the hemoglobin during this admission.  There is no clear evidence for hemolysis on the work-up to date.  Anemia is in part related to chronic renal failure, phlebotomy, and potentially acute infection.  I have a low clinical suspicion for TTP.  The creatinine is minimally changed from baseline, the bilirubin has improved over the past several days, and TTP does not explain the liver failure, CHF, or rhabdomyolysis.  The elevated LDH is most likely related to liver failure and rhabdomyolysis.  Recommendations 1.  Continue antibiotics, follow-up cultures 2.  Heart failure management per cardiology 3.  Diflucan for oral candidiasis 4.  Daily CBC 5.  Consider trial of steroids pending muscle biopsy   Hematology will continue following Dennis Howard.    Thornton Papas, MD 09/17/2020, 3:11 PM

## 2020-09-17 NOTE — Progress Notes (Addendum)
NAME:  Dennis Gabrielsen., MRN:  [id number], DOB:  1965-08-10, LOS: 4 ADMISSION DATE:  09/03/2020, CONSULTATION DATE: 09/19/2020 REFERRING MD: Wilkie Aye - EM CHIEF COMPLAINT:  Altered Mental Status  History of Present Illness:  55 y/o man with a past medical history of chronic kidney disease, hypertension who presented to [facility] on 4/15 with CC AMS.  Symptoms started a week prior to admit.  His baseline mental status is alert and oriented to person place time and event.  Mental status changes were described as somnolent and decreased responsiveness.  He has associated lower extremity swelling, weight gain, shortness of breath, and cough. No associated chest pain, fever, syncope. He has never had these symptoms before.  He was recently seen at [facility] emergency department on 08/18/2020 for cough and shortness of breath.  He was diagnosed with pneumonia and discharged home with doxycycline.  He returned to [facility] 4/15 with altered mental status and lower extremity swelling which were not present at the time of his emergency department visit.  He works for [employer].  Former tobacco smoker.  Quit years ago.  No alcohol.  Marijuana daily use per patient for ~ 20 years.    Pertinent  Medical History  CKD III - sr cr 2013 1.6 HTN Bronchitis  Significant Hospital Events: Including procedures, antibiotic start and stop dates in addition to other pertinent events   . 4/15 Admitted to ICU. Treated with azithro, ceftriaxone, flagyl, unasyn. Tylenol, acetaminophen, ETOH, influenza, COVID, hepatitis, urine strep antigen, legionella, & UDS negative.  . 4/16 ECHO with global hypokinesis, anteroseptal wall and apex are akinetic, anterior wall severely hypokinetic, very large layered semi-mobile apical thrombus extending from the apex along the anteroseptal wall, LVEF ~15-20%, RV systolic function mild to moderately reduced.  tarted on dobuatmine drip. ANA negative.  . 4/17 Unasyn stopped, cefepime initiated  . 4/18  Dobutamine at , net neg 1.7L, tolerating soft diet, no n/v  Interim History / Subjective:  Afebrile / WBC 35.1 RA  Glucose range 125-140  I/O 3.2L UOP, -2.2L in last 24 hours (-7.8L overall) On dobutamine at , SvO2 44  Objective   Blood pressure (!) 112/91, pulse (!) 109, temperature 98.42 F (36.9 C), resp. rate 18, height 6' (1.829 m), weight 76 kg, SpO2 100 %. CVP:  [3 mmHg-8 mmHg] 7 mmHg      Intake/Output Summary (Last 24 hours) at 09/17/2020 2549 Last data filed at 09/17/2020 0600 Gross per 24 hour  Intake 990.27 ml  Output 3285 ml  Net -2294.73 ml   Filed Weights   09/15/20 0446 09/16/20 0500 09/17/20 0500  Weight: 80.5 kg 75.7 kg 76 kg    Examination: General: ill appearing adult male sitting up in bed in NAD HEENT: MM pink/moist, upper palate creamy yellow patchy areas, tongue with area of sloughing that appears like a scald, anicteric, good dentition  Neuro: Awakens, mild drowsiness, weakness noted > UE strength 4/5 with weak grips, LE ~3/5 (unable to lift LE's off bed), had difficulty leaning forward for lung exam CV: s1s2 regular, ST on monitor 110's, no m/r/g PULM: non-labored at rest, lungs bilaterally clear anterior, diminished bases GI: soft, bsx4 active  Extremities: warm/dry, BLE 1-2+ edema, small circular (<1cm) flat & raised fluid filled lesions  Skin: no rashes or lesions   Resolved Hospital Problem list   Encephalopathy  Assessment & Plan:   Cardiogenic Shock Acute Biventricular Heart Failure LV Thrombus NSTEMI Unknown etiology of heart failure at this time. Ischemic vs infectious vs  inflammatory.  Concern that this is not a typical presentation of heart failure, query underlying autoimmune process or malignancy.  -appreciate Cardiology evaluation  -continue dobutamine -continue to hold heparin gtt, consider restart pm 4/19 or 4/20 am and observe closely  -follow up antiphospholipid antibody from 4/16  -continue lasix 40 mg BID  -follow  SvO2  -will likely need L/RHC at some point  -send myositis panel, sed rate, mycoplasma antibody, aldolase.  -consider steroids, utility of muscle biopsy -discussed with Cardiology, will call Hematology for evaluation   Sepsis due to Pneumonia Aspiration Pneumonia -Continue cefepime, abx changed 4/17. Plan for 7 days duration  -follow intermittent CXR -assess CT chest w/o contrast given R airspace disease on CT ABD   Congestive Hepatopathy Dilated Bowel Loops - improved -follow LFT's, improved -place cortrak for nutrition   Acute on Chronic Kidney Disease stage IIIA-B Lactic Acidosis Note uses NSAIDS, ACE-I at home and had contrast on 3/28 -lasix as above for CHF  -Trend BMP / urinary output -Replace electrolytes as indicated -Avoid nephrotoxic agents as able, ensure adequate renal perfusion   Anemia  Thrombocytopenia  Thought in setting of hepatic congestion  -monitor trend -heparin on hold  -transfuse for Hgb <7%  Elevated Creatinine Kinase / Rhabdomyolysis On statin at PTA  -follow CK  Acute Metabolic Encephalopathy  -supportive care, improved -follow up ammonia   Moderate Malnutrition  -place cortrak  -consider enteral feeding   Thrush -magic mouthwash TID   Best practice (right click and "Reselect all SmartList Selections" daily)  Diet:  Oral Pain/Anxiety/Delirium protocol (if indicated): No VAP protocol (if indicated): Not indicated DVT prophylaxis: Systemic AC GI prophylaxis: PPI Glucose control:  SSI No Central venous access:  Yes, and it is still needed Arterial line:  N/A Foley:  Yes, and it is no longer needed, discontinue Mobility:  bed rest  PT consulted: N/A Last date of multidisciplinary goals of care discussion - N/A Code Status:  full code Disposition: ICU  Labs   CBC: Recent Labs  Lab 08/31/2020 1634 09/08/2020 1839 09/15/20 0321 09/16/20 0501 09/16/20 1240 09/16/20 1738 09/16/20 2106 09/17/20 0500  WBC 18.4*   < > 25.0* 28.1*  31.0* 29.8*  --  35.1*  NEUTROABS 16.2*  --   --   --   --  25.8*  --   --   HGB 14.2   < > 12.3* 9.4* 8.3* 8.4* 8.7* 8.7*  HCT 40.3   < > 36.0* 27.7* 25.4* 25.6* 26.5* 25.6*  MCV 83.6   < > 87.4 90.5 90.1 90.1  --  88.3  PLT 121*   < > 103* 96* 75* 72*  --  69*   < > = values in this interval not displayed.    Basic Metabolic Panel: Recent Labs  Lab 09/16/2020 2300 09/14/20 0031 09/14/20 0940 09/15/20 0321 09/16/20 0501 09/17/20 0500  NA  --  135 141 144 145 145  K  --  5.9* 4.8 4.9 4.5 4.5  CL  --  103 105 111 112* 110  CO2  --  21* 19* 25 28 28   GLUCOSE  --  122* 135* 137* 132* 140*  BUN  --  99* 107* 96* 97* 98*  CREATININE  --  1.86* 2.07* 1.99* 1.88* 1.97*  CALCIUM  --  7.7* 7.8* 7.6* 7.5* 7.5*  MG 2.8* 2.8*  --   --   --   --   PHOS 4.2 4.0  --   --   --   --  GFR: Estimated Creatinine Clearance: 46.6 mL/min (A) (by C-G formula based on SCr of 1.97 mg/dL (H)). Recent Labs  Lab 09/15/2020 1802 09/08/2020 2114 09/14/20 0031 09/14/20 0940 09/15/20 0321 09/15/20 0836 09/16/20 0501 09/16/20 1240 09/16/20 1738 09/17/20 0500  PROCALCITON  --   --   --  3.70  --  2.90  --   --   --   --   WBC  --   --    < > 25.3*   < >  --  28.1* 31.0* 29.8* 35.1*  LATICACIDVEN 2.2* 2.7*  --   --   --   --   --   --   --   --    < > = values in this interval not displayed.    Liver Function Tests: Recent Labs  Lab 09/05/2020 1634 09/14/20 0031 09/15/20 0321 09/16/20 0501 09/17/20 0500  AST 824* 783* 476* 244* 189*  ALT 1,200* 1,119* 833* 576* 401*  ALKPHOS 933* 880* 689* 472* 348*  BILITOT 7.0* 5.9* 4.6* 3.4* 3.3*  PROT 5.4* 5.4* 4.9* 4.5* 4.2*  ALBUMIN 2.2* 2.2* 1.9* 1.7* 1.6*   Recent Labs  Lab 09/24/2020 2300  LIPASE 142*   Recent Labs  Lab 09/22/2020 1634  AMMONIA 65*    ABG    Component Value Date/Time   PHART 7.437 09/02/2020 2258   PCO2ART 29.3 (L) 09/09/2020 2258   PO2ART 50 (L) 09/16/2020 2258   HCO3 21.7 09/14/2020 0031   TCO2 21 (L) 09/02/2020 2258    ACIDBASEDEF 1.8 09/14/2020 0031   O2SAT 46.0 09/16/2020 1151     Coagulation Profile: Recent Labs  Lab 09/24/2020 2128  INR 1.7*    Cardiac Enzymes: Recent Labs  Lab 09/14/20 0031 09/14/20 0940 09/15/20 0836  CKTOTAL 5,299* 6,502* 4,025*  CKMB  --   --  14.1*    HbA1C: No results found for: HGBA1C  CBG: Recent Labs  Lab 09/15/20 1217 09/15/20 1537 09/15/20 2004 09/15/20 2339 09/16/20 0324  GLUCAP 133* 108* 151* 143* 125*      Critical care time: 35 minutes    Canary Brim, MSN, APRN, NP-C, AGACNP-BC [facility] 09/17/2020, 8:12 AM   Please see [email or web address] for pager details.   From 7A-7P if no response, please call [phone] After hours, please call ELink [phone]

## 2020-09-17 NOTE — Progress Notes (Signed)
Date and time results received: 09/17/20  (use smartphrase ".now" to insert current time)  Test: Lactic  Critical Value: 2.9  Name of Provider Notified: Dr. Francine Graven  Orders Received? Or Actions Taken?: Orders Received - See Orders for details

## 2020-09-17 NOTE — Consult Note (Addendum)
Neurology Consultation  Reason for Consult: Persistent muscle weakness Referring Physician: Dr. Erin Fulling  CC: Persistent muscle weakness  History is obtained from: Chart review, attending provider  HPI: Dennis Howard. is a 55 y.o. male with a medical history significant for hypertension, chronic kidney disease stage III, and recent community acquired pneumonia on home Doxycycline who presented to the ED 4/15 due to drowsiness and bilateral lower extremity edema with complaints of orthopnea at home for approximately 4 days prior to current admission. His initial work up revealed concern for sepsis with leukocytosis and elevated lactate. Also of note, patient has been found to have an acute kidney injury on top of his CKD, transaminase elevation, and increased bilirubin. An echocardiogram was obtained during his admission revealing a severely depressed LVEF of approximately 15% and severe global hypokinesis as well as an LV thrombus.  He was initially started on heparin drip but this has been held due to thrombocytopenia and anemia, with current platelet counts in the 70s.  He has been on dobutamine for cardiac support.  Other significant lab values include an initially elevated ammonia level of 65 with resolution to 17 today, elevated CRP at 5.7, and an elevated CK level of 5,299 initially with gradual improvement to 3,219 today but with a worsening leukocytosis with a WBC as high as 31.0K since admission, of unexplained etiology.   Due to persistent generalized weakness neurology was consulted for further evaluation and work up.  Although initially there was concern for infection at this time critical care medicine is favoring an inflammatory process explaining his worsening lung infiltrates on CT. CT abdomen pelvis is notable for body wall edema of the bilateral lower extremities of the lower flank and gluteal regions  There has been concern for shock liver given elevated AST, ALT.  Right upper  quadrant ultrasound was notable for extensive biliary sludge without evidence of acute cholecystitis, and some heterogenous echogenicity possibly in the setting of intrinsic liver disease versus steatosis  Dennis Howard began to experience muscle weakness a progressing since approximately 5 weeks ago with the onset of his lower extremity swelling. He states that he first noticed the weakness and leg "heaviness" when attempting to walk up stairs or stand from the toilet in the restroom. He did not note weakness in his hands initially and is unsure if he experienced proximal weakness in his upper extremities with the initial lower extremity proximal weakness. He does deny visual changes, diplopia, trouble swallowing, trouble chewing. He endorses that his lower extremities are weaker than his upper extremities and that the weakness has progressed over the last 5 weeks. His mother states that he has episodes at home where he becomes more drowsy and slurs his speech with some visual hallucinations (seeing people who are not there, or losing sense of time in which he is surprised when someone is gone) but these moments are transient and have not persisted. Dennis Howard complains that he just feels that his legs are "heavy" and that is why he has weakness in them. He does endorse burning pain with touching the dorsal feet bilaterally and pain in his toes with any manipulation.   Unclear statin exposure at this time, although the patient has atorvastatin listed on his home medication list this medication has been held appropriately during current admission given elevated CK, however he denies knowledge of this medication and it is not on prior EMR notes  ROS: A complete ROS was performed and is negative except as noted in the  HPI.  Past Medical History:  Diagnosis Date  . Bronchitis   . Environmental allergies   . Hypertension    Family History  Problem Relation Age of Onset  . CAD Neg Hx    Social History:    reports that he has quit smoking. He has never used smokeless tobacco. He reports previous alcohol use. He reports that he does not use drugs.  Medications  Current Facility-Administered Medications:  .  [START ON 09/16/2020] ceFAZolin (ANCEF) IVPB 2g/100 mL premix, 2 g, Intravenous, On Call to OR, Meuth, Brooke A, PA-C .  ceFEPIme (MAXIPIME) 2 g in sodium chloride 0.9 % 100 mL IVPB, 2 g, Intravenous, Q12H, Donnamae Jude, RPH, Stopped at 09/17/20 0830 .  Chlorhexidine Gluconate Cloth 2 % PADS 6 each, 6 each, Topical, Daily, Sheliah Mends, DO, 6 each at 09/17/20 0945 .  6 CHG cloth bath night before surgery, , , Once **AND** [START ON 09/14/2020] 6 CHG cloth bath AM of surgery, , , Once **AND** Chlorhexidine Gluconate Cloth 2 % PADS 6 each, 6 each, Topical, Once **AND** Chlorhexidine Gluconate Cloth 2 % PADS 6 each, 6 each, Topical, Once, Meuth, Brooke A, PA-C .  DOBUTamine (DOBUTREX) infusion 4000 mcg/mL, 7.5 mcg/kg/min, Intravenous, Titrated, Simmons, Brittainy M, PA-C, Last Rate: 9.28 mL/hr at 09/17/20 1440, 7.5 mcg/kg/min at 09/17/20 1440 .  docusate (COLACE) 50 MG/5ML liquid 100 mg, 100 mg, Per Tube, BID PRN, Verlin Fester F, DO .  furosemide (LASIX) injection 40 mg, 40 mg, Intravenous, BID, Bensimhon, Shaune Pascal, MD, 40 mg at 09/17/20 0754 .  heparin ADULT infusion 100 units/mL (25000 units/226mL), 900 Units/hr, Intravenous, Continuous, Lyndee Leo, [facility], Held at 09/17/20 1432 .  magic mouthwash, 10 mL, Oral, TID, Ollis, Brandi L, NP, 10 mL at 09/17/20 1051 .  oxyCODONE (Oxy IR/ROXICODONE) immediate release tablet 5 mg, 5 mg, Oral, Q4H PRN, Freddi Starr, MD, 5 mg at 09/17/20 1436 .  pantoprazole (PROTONIX) injection 40 mg, 40 mg, Intravenous, Q12H, Freddi Starr, MD, 40 mg at 09/17/20 0949 .  sodium chloride flush (NS) 0.9 % injection 10-40 mL, 10-40 mL, Intracatheter, Q12H, Freddi Starr, MD, 10 mL at 09/17/20 0949 .  sodium chloride flush (NS) 0.9 % injection 10-40 mL,  10-40 mL, Intracatheter, PRN, Freddi Starr, MD  Exam: Current vital signs: BP 113/82   Pulse (!) 108   Temp 98.42 F (36.9 C)   Resp 18   Ht 6' (1.829 m)   Wt 76 kg   SpO2 100%   BMI 22.72 kg/m  Vital signs in last 24 hours: Temp:  [98.06 F (36.7 C)-98.6 F (37 C)] 98.42 F (36.9 C) (04/19 0900) Pulse Rate:  [107-118] 108 (04/19 1300) Resp:  [0-22] 18 (04/19 1300) BP: (87-130)/(64-92) 113/82 (04/19 1300) SpO2:  [92 %-100 %] 100 % (04/19 1300) Weight:  [76 kg] 76 kg (04/19 0500)  GENERAL: Drowsy, wakes to voice, in no acute distress Head: Normocephalic and atraumatic, without obvious abnormality EENT: No OP obstruction, normal conjunctivae LUNGS: Normal respiratory effort. Non-labored breathing CV: Tachycardic on cardiac monitor ABDOMEN - Soft, non-tender, non-distended Ext: 2+ pitting edema of bilateral feet, coban dressing on bilateral distal feet from patellae to ankles.   NEURO:  Mental Status: Drowsy, wakes easily to voice, alert to self, place, time, age, and month. He is able to provide a clear and coherent history of present illness but he does have some poor attention and does not provide thorough details of events leading up  to hospitalization. Speech is somewhat hypophonic but without dysarthria or aphasia. Naming and repetition are intact. No neglect is noted.  Cranial Nerves:  II: PERRL 4 mm/brisk. Visual fields full.  III, IV, VI: EOMI. Will fixate and track examiner.  V: Sensation is intact to light touch and symmetrical to face.  VII: Face is symmetric resting with elevation on the left higher with smiling (per baseline per mother at bedside) VIII: Hearing is intact to voice IX, X: Palate elevation is symmetric. Phonation mildly hypophonic.  XI: Able to touch chin to shoulders bilaterally. XII: Tongue protrudes midline   Motor: Generalized weakness. Bilateral upper extremities with 5/5 grip strength and 5/5 strength biceps and triceps. 4-/5 strength  is noted at the shoulders. Able to initiate antigravity movement but unable to sustain antigravity movement due to "feeling week" with drift to bed. Bilateral lower extremities 1/5 at the hips and knees and 2/5 strength at the ankle with dorsi/plantar flexion bilaterally. Unable to overcome gravity bilaterally with lower extremities. Tone is normal. Bulk is normal. Fine tremor noted in the left hand and forearm intermittently at rest, later observed in the right arm as well.  He does have some fasciculations in the bilateral upper extremities  On attending examination, 4+/5 in the deltoids, otherwise 5/5 throughout the upper extremities, 1/5 at the hips, 2/5 knee extension, 1/5 knee  flexion, 3/5 foot dorsi and plantar flexion (with coaching) Sensation: Intact to light touch bilaterally in all four extremities. Coordination: FTN intact bilaterally. Unable to assess HKS due to weakness.  DTRs: 2+ but relatively hypoactive in the biceps and brachioradialis bilaterally symmetric, unable to elicit at the patellar's/Achilles (occasionally confounded by patient jumping in pain), Plantars: Down-going bilaterally on NP evaluation, withdrawal/mute on attending evaluation Gait- deferred given significant lower extremity weakness  Labs I have reviewed labs in epic and the results pertinent to this consultation are: CBC    Component Value Date/Time   WBC 35.1 (H) 09/17/2020 0500   RBC 2.80 (L) 09/17/2020 1202   RBC 2.90 (L) 09/17/2020 0500   HGB 8.7 (L) 09/17/2020 0500   HCT 25.6 (L) 09/17/2020 0500   PLT 69 (L) 09/17/2020 0500   MCV 88.3 09/17/2020 0500   MCH 30.0 09/17/2020 0500   MCHC 34.0 09/17/2020 0500   RDW 18.9 (H) 09/17/2020 0500   LYMPHSABS 1.3 09/16/2020 1738   MONOABS 2.5 (H) 09/16/2020 1738   EOSABS 0.0 09/16/2020 1738   BASOSABS 0.0 09/16/2020 1738   CMP     Component Value Date/Time   NA 145 09/17/2020 0500   K 4.5 09/17/2020 0500   CL 110 09/17/2020 0500   CO2 28 09/17/2020 0500    GLUCOSE 140 (H) 09/17/2020 0500   BUN 98 (H) 09/17/2020 0500   CREATININE 1.97 (H) 09/17/2020 0500   CALCIUM 7.5 (L) 09/17/2020 0500   PROT 4.2 (L) 09/17/2020 0500   ALBUMIN 1.6 (L) 09/17/2020 0500   AST 189 (H) 09/17/2020 0500   ALT 401 (H) 09/17/2020 0500   ALKPHOS 348 (H) 09/17/2020 0500   BILITOT 3.3 (H) 09/17/2020 0500   GFRNONAA 40 (L) 09/17/2020 0500   GFRAA 54 (L) 11/11/2013 0440   Lab Results  Component Value Date   CKTOTAL 3,219 (H) 09/17/2020   CKMB 14.1 (H) 09/15/2020   Blood cultures: No growth at 3 days    Component Value Date/Time   CRP 5.7 (H) 09/17/2020 0804   Lactic Acid, Venous    Component Value Date/Time   LATICACIDVEN 2.9 ([place]) 09/17/2020  1239   Lab Results  Component Value Date   VITAMINB12 7,102 (H) 09/23/2020   Lab Results  Component Value Date   ESRSEDRATE 17 (H) 09/17/2020   Urinalysis    Component Value Date/Time   COLORURINE YELLOW 09/17/2020 1802   APPEARANCEUR CLEAR 09/06/2020 1802   LABSPEC 1.019 09/05/2020 1802   PHURINE 5.0 09/16/2020 1802   GLUCOSEU NEGATIVE 09/03/2020 1802   HGBUR NEGATIVE 09/17/2020 1802   BILIRUBINUR NEGATIVE 09/27/2020 1802   KETONESUR NEGATIVE 09/06/2020 1802   PROTEINUR NEGATIVE 09/04/2020 1802   NITRITE NEGATIVE 09/25/2020 1802   LEUKOCYTESUR NEGATIVE 09/01/2020 1802   Drugs of Abuse     Component Value Date/Time   LABOPIA NONE DETECTED 09/17/2020 1802   COCAINSCRNUR NONE DETECTED 09/12/2020 1802   LABBENZ NONE DETECTED 09/27/2020 1802   AMPHETMU NONE DETECTED 09/20/2020 1802   THCU NONE DETECTED 09/24/2020 1802   LABBARB NONE DETECTED 09/17/2020 1802    Results for orders placed or performed during the hospital encounter of 09/24/2020  Culture, blood (routine x 2)     Status: None (Preliminary result)   Collection Time: 08/31/2020  6:00 PM   Specimen: BLOOD RIGHT HAND  Result Value Ref Range Status   Specimen Description BLOOD RIGHT HAND  Final   Special Requests   Final    BOTTLES DRAWN  AEROBIC AND ANAEROBIC Blood Culture results may not be optimal due to an inadequate volume of blood received in culture bottles   Culture   Final    NO GROWTH 4 DAYS Performed at [facility], [place] [address], [place], [place] [address]    Report Status PENDING  Incomplete  Resp Panel by RT-PCR (Flu A&B, Covid) Urine, Clean Catch     Status: None   Collection Time: 09/15/2020  6:02 PM   Specimen: Urine, Clean Catch; Nasopharyngeal(NP) swabs in vial transport medium  Result Value Ref Range Status   SARS Coronavirus 2 by RT PCR NEGATIVE NEGATIVE Final    Comment: (NOTE) SARS-CoV-2 target nucleic acids are NOT DETECTED.  The SARS-CoV-2 RNA is generally detectable in upper respiratory specimens during the acute phase of infection. The lowest concentration of SARS-CoV-2 viral copies this assay can detect is 138 copies/mL. A negative result does not preclude SARS-Cov-2 infection and should not be used as the sole basis for treatment or other patient management decisions. A negative result may occur with  improper specimen collection/handling, submission of specimen other than nasopharyngeal swab, presence of viral mutation(s) within the areas targeted by this assay, and inadequate number of viral copies(<138 copies/mL). A negative result must be combined with clinical observations, patient history, and epidemiological information. The expected result is Negative.  Fact Sheet for Patients:  EntrepreneurPulse.com.au  Fact Sheet for Healthcare Providers:  IncredibleEmployment.be  This test is no t yet approved or cleared by the [place] FDA and  has been authorized for detection and/or diagnosis of SARS-CoV-2 by FDA under an Emergency Use Authorization (EUA). This EUA will remain  in effect (meaning this test can be used) for the duration of the COVID-19 declaration under Section 564(b)(1) of the Act, 21 U.S.C.section 360bbb-3(b)(1), unless the  authorization is terminated  or revoked sooner.       Influenza A by PCR NEGATIVE NEGATIVE Final   Influenza B by PCR NEGATIVE NEGATIVE Final    Comment: (NOTE) The Xpert Xpress SARS-CoV-2/FLU/RSV plus assay is intended as an aid in the diagnosis of influenza from Nasopharyngeal swab specimens and should not be used as  a sole basis for treatment. Nasal washings and aspirates are unacceptable for Xpert Xpress SARS-CoV-2/FLU/RSV testing.  Fact Sheet for Patients: EntrepreneurPulse.com.au  Fact Sheet for Healthcare Providers: IncredibleEmployment.be  This test is not yet approved or cleared by the [place] FDA and has been authorized for detection and/or diagnosis of SARS-CoV-2 by FDA under an Emergency Use Authorization (EUA). This EUA will remain in effect (meaning this test can be used) for the duration of the COVID-19 declaration under Section 564(b)(1) of the Act, 21 U.S.C. section 360bbb-3(b)(1), unless the authorization is terminated or revoked.  Performed at [facility], [place] [address], [place], [place] [address]   MRSA PCR Screening     Status: None   Collection Time: 09/05/2020 11:42 PM   Specimen: Nasopharyngeal  Result Value Ref Range Status   MRSA by PCR NEGATIVE NEGATIVE Final    Comment:        The GeneXpert MRSA Assay (FDA approved for NASAL specimens only), is one component of a comprehensive MRSA colonization surveillance program. It is not intended to diagnose MRSA infection nor to guide or monitor treatment for MRSA infections. Performed at [facility], [place] [address], [place], [place] [address]   Culture, blood (routine x 2)     Status: None (Preliminary result)   Collection Time: 09/14/20 12:12 AM   Specimen: BLOOD  Result Value Ref Range Status   Specimen Description BLOOD LEFT ANTECUBITAL  Final   Special Requests AEROBIC BOTTLE ONLY Blood Culture adequate volume  Final   Culture   Final     NO GROWTH 3 DAYS Performed at [facility], [place] [address], [place], [place] [address]    Report Status PENDING  Incomplete  Culture, blood (routine x 2)     Status: None (Preliminary result)   Collection Time: 09/14/20 12:12 AM   Specimen: BLOOD LEFT HAND  Result Value Ref Range Status   Specimen Description BLOOD LEFT HAND  Final   Special Requests AEROBIC BOTTLE ONLY Blood Culture adequate volume  Final   Culture   Final    NO GROWTH 3 DAYS Performed at [facility], [place] [address], [place], [place] [address]    Report Status PENDING  Incomplete   Unresulted Labs (From admission, onward)          Start     Ordered   09/24/2020 0500  Heparin level (unfractionated)  Daily,   R     Question:  Specimen collection method  Answer:  Unit=Unit collect   09/17/20 1326   09/17/20 2058  Gamma GT  Add-on,   AD       Question:  Specimen collection method  Answer:  Unit=Unit collect   09/17/20 2057   09/17/20 2041  Heparin level (unfractionated)  Once-Timed,   TIMED       Question:  Specimen collection method  Answer:  Unit=Unit collect   09/17/20 2040   09/17/20 1043  ANCA Titers  (Anti-Neutrophilic Cystoplasmic Antibody Panel (PNL))  Once,   R       Question:  Specimen collection method  Answer:  Unit=Unit collect   09/17/20 1042   09/17/20 1043  Mpo/pr-3 (anca) antibodies  (Anti-Neutrophilic Cystoplasmic Antibody Panel (PNL))  Once,   R       Question:  Specimen collection method  Answer:  Unit=Unit collect   09/17/20 1042   09/17/20 0912  Miscellaneous [facility] test (send-out)  Once,   R  Comments: Test Id Test Order Name Order LOINC ValueFMMPP MyoMarker 3 Plus Profile     [id number] Anti-Jo-1 Ab [phone] Anti-PL-7 Ab [phone] Anti-PL-12 Ab [phone] Anti-EJ Ab [phone] Anti-OJ Ab [id number] Anti-SRP Ab [phone] Anti-Mi-2-Ab [phone] Anti-U3 RNP (Fibrillarin) [phone] Anti-MDA-5 Ab (CADM-140) [phone] Anti-NXP-2 (P140)  Ab [phone] Anti-TIF-1gamma Ab [phone] Anti-PM/Scl-100 Ab [phone] Anti-U2 RNP Ab [phone] Anti-U1 RNP Ab [phone] Anti-Ku Ab [phone] Anti-SS-A 52kD Ab, IgG [phone] Anti-SAE1 Ab, IgG [id number]   Question:  Test name / description:  Answer:  MyoMarker   09/17/20 0915   09/17/20 0905  Aldolase  Once,   R       Question:  Specimen collection method  Answer:  Unit=Unit collect   09/17/20 0904   09/17/20 0747  ADAMTS13 Activity  Once,   R       Question:  Specimen collection method  Answer:  Unit=Unit collect   09/17/20 0746   09/17/20 0744  Glucose 6 phosphate dehydrogenase  Once,   R       Question:  Specimen collection method  Answer:  Unit=Unit collect   09/17/20 0743   09/17/20 0740  Legionella Pneumophila Serogp 1 Ur Ag  Once,   R        09/17/20 0739   09/17/20 0737  Mycoplasma pneumoniae antibody, IgM  Once,   R       Question:  Specimen collection method  Answer:  Unit=Unit collect   09/17/20 0736   09/15/20 0500  CBC  Daily,   R      09/14/20 0026   09/15/20 0500  Comprehensive metabolic panel  Daily,   R      09/14/20 1352   09/15/20 0500  .Cooxemetry Panel (carboxy, met, total hgb, O2 sat)  Daily,   R      09/14/20 1746   09/14/20 0912  Antiphospholipid syndrome eval, bld  Once,   R        09/14/20 0911   08/30/2020 2200  Culture, Respiratory w Gram Stain  Once,   STAT        09/28/2020 2201   09/08/2020 2133  Respiratory (~20 pathogens) panel by PCR  (Respiratory panel by PCR (~20 pathogens, ~24 hr TAT)  w precautions)  Once,   STAT        09/02/2020 2151           Imaging I have reviewed the images obtained: CT-scan of the brain 4/15: No acute intracranial abnormality.  CT chest without contrast:  Multiple opacities in bilateral lungs consistent with multifocal pneumonia.   Assessment: 55 year old male who presented with drowsiness, weakness, and encephalopathy found to be septic with and AKI superimposed on CKD, transaminases, elevated  ESR/CRP, multifocal pneumonia, an LVEF of 15% with a LV thrombus and severe hypokinesis, worsening leukocytosis, possible rhabdomyolysis, and anemia with persistent weakness and drowsiness.  Given his medical complexity he certainly has possibility of multiple coexisting neurological issues - Examination reveals generalized muscle weakness with neuropathic pain in bilateral lower extremities. Weakness is bilaterally symmetric; lower extremities > upper extremities.  Additionally he has depressed reflexes in the lower extremities -Given the concern for an inflammatory process, supported by elevated inflammatory markers, chest CT findings with negative infectious work-up as above, brisk leukocytosis, thrombocytopenia, anemia, I am concerned about the possibility of dermatomyositis or polymyositis though he does not report the typical rashes or have evidence of nail changes/alopecia, muscle weakness in the absence of a rash can occur and cardiac symptoms as well as interstitial  lung disease can be present. -With LV thrombus and severely reduced EF, as well as initial hypotension there is additionally possibility of multifocal strokes or watershed infarcts which can account for some of his proximal muscle weakness (though overall examination localizes better to muscle/nerve) -Critical illness myopathy/neuropathy less likely given highly elevated CK -Agree with plan for muscle biopsy for further evaluation.  Recommendations: - GGT to evaluate for muscle related elevation in AST/ALT vs hepatic injury - [facility] myositis panel (mild marker 3 profile) - MRI brain w/o contrast - EMG/NCS could be helpful but available only as an outpatient study at this time  - Agree with planned muscle biopsy  - Neurology will continue to follow  Anibal Henderson, AGAC-NP [facility] Pager: [phone]

## 2020-09-17 NOTE — Progress Notes (Addendum)
Initial Nutrition Assessment  DOCUMENTATION CODES:   Severe malnutrition in context of acute illness/injury  INTERVENTION:   Pt remains on clear liquids at this time  - Boost Breeze po TID, each supplement provides 250 kcal and 9 grams of protein  -MVI with minerals   Recommend cortrak placement 4/20 Initiate Osmolite 1.5 @ 20 ml/hr and increase by 10 ml every 8 hours to goal rate of 60 ml/hr  45 ml ProSource TF TID At goal provides: 2280 kcal, 123 grams protein, and 1094 ml free water.   Monitor magnesium and phosphorus every 12 hours x 4 occurances, MD to replete as needed, as pt is at risk for refeeding syndrome given severe malnutrition.   NUTRITION DIAGNOSIS:   Severe Malnutrition related to decreased appetite,acute illness (sore tongue) as evidenced by moderate muscle depletion,moderate fat depletion.  GOAL:   Patient will meet greater than or equal to 90% of their needs  MONITOR:   PO intake,Supplement acceptance,TF tolerance  REASON FOR ASSESSMENT:   Consult Assessment of nutrition requirement/status  ASSESSMENT:   Pt with PMH of CKD stage III, and HTN admitted 4/15 with mixed cardiogenic and septic shock and multisystem organ failure in setting of PNA   HF team following: ECHO EF: 15%. Started on dobutamine WBC higher on Abx. BLE edema on lasix. Wrapping BLE due to 3rd spacing. Plan for R/L cath as he recovers. Noted hemoglobin dropping with no known source of bleeding.   4/18 NG tube removed; started on clear liquid diet   Spoke with pt and mom who was at bedside. Per pt and mom pt was at his usual weight until 4-5 weeks ago. He began losing weight and lost his appetite. He also complains of sore tongue causing decreased intake. Per mom pt was only taking some liquids. He had tried a muscle milk and liked it but was not drinking these consistently PTA. Weight hx a little unclear. Per pt he was around 190 lb until 5 weeks ago and is now down to 167 lb which  would be 13% weight loss during that time. Mom feels that the weight loss has been less. There are no weight records after 2014 to confirm weight loss.  Admission weight 82.7 kg after lasix down to 76 kg. (6.7 kg decrease with diureses)  Discussed cortrak placement with pt and mom, all questions answered. Pt agreeable. Noted pt very weak and trembling during exam.   Medications reviewed and include: lasix 40 mg BID Dobutamine Labs reviewed: BUN: 98, Cr: 1.97, Alk Phos: 348, bilirubin 1.5, WBC: 35.1, hgb: 8.9    NUTRITION - FOCUSED PHYSICAL EXAM:  Flowsheet Row Most Recent Value  Orbital Region Mild depletion  Upper Arm Region Moderate depletion  Thoracic and Lumbar Region Moderate depletion  Buccal Region Moderate depletion  Temple Region Moderate depletion  Clavicle Bone Region Moderate depletion  Clavicle and Acromion Bone Region Severe depletion  Scapular Bone Region Severe depletion  Dorsal Hand Mild depletion  Patellar Region No depletion  Anterior Thigh Region No depletion  Posterior Calf Region Unable to assess  Edema (RD Assessment) Unable to assess  [BLE wrapped]  Hair Reviewed  Eyes Reviewed  Mouth Reviewed  Skin Reviewed  Nails Reviewed  [pale nail beds in setting of low hemoglobin]       Diet Order:   Diet Order            Diet NPO time specified  Diet effective midnight  Diet clear liquid Room service appropriate? Yes; Fluid consistency: Thin  Diet effective now                 EDUCATION NEEDS:   No education needs have been identified at this time  Skin:  Skin Assessment: Reviewed RN Assessment  Last BM:  4/16  Height:   Ht Readings from Last 1 Encounters:  09/14/20 6' (1.829 m)    Weight:   Wt Readings from Last 1 Encounters:  09/17/20 76 kg    Ideal Body Weight:  80.9 kg  BMI:  Body mass index is 22.72 kg/m.  Estimated Nutritional Needs:   Kcal:  2200-2400  Protein:  115-135 grams  Fluid:  2 L/day  Lockie Pares., RD,  LDN, CNSC See AMiON for contact information

## 2020-09-17 NOTE — Progress Notes (Addendum)
Advanced Heart Failure Rounding Note  PCP-Cardiologist: No primary care provider on file.    Patient Profile   55 y/o male w/ no prior cardiac history, admitted for mixed cardiogenic + septic shock in setting of PNA + hepatic encephalopathy.   ECHO LVEF 15% with severe global HK with regional variability + LV clot RV moderately down. Initial Co-ox 48%. Started on DBA.    Subjective:    On DBA 5. Co-ox low at 45%. This is also in setting of acute anemia. Unexplained drop in Hgb from 15 on admit>> 8.4.  - CT of A/P negative for RPB - transfused 1 unit 4/18, Hgb 8.4>>8.7 today  - WBC 35K, Plt 69K   Associated leukocytosis + thrombocytopenia raises concern for acute hematologic process ? Leukemia. Blood smear pending. CT of chest pending.    Scr 1.88>>1.97. BUN 98  HFTs trending down INR 1.4   SBPs low 100s-120. Sinus tach, low 100s on tele. CVP 2  Remains on cefepime for PNA.   He is very weak appearing. Lethargic but  Easily awakes and responds to questions. Mother present at bedside.     Objective:   Weight Range: 76 kg Body mass index is 22.72 kg/m.   Vital Signs:   Temp:  [98.06 F (36.7 C)-98.6 F (37 C)] 98.42 F (36.9 C) (04/19 0900) Pulse Rate:  [105-118] 109 (04/19 0900) Resp:  [0-22] 13 (04/19 0900) BP: (87-115)/(64-96) 104/76 (04/19 0900) SpO2:  [97 %-100 %] 98 % (04/19 0900) Weight:  [76 kg] 76 kg (04/19 0500) Last BM Date: 09/14/20  Weight change: Filed Weights   09/15/20 0446 09/16/20 0500 09/17/20 0500  Weight: 80.5 kg 75.7 kg 76 kg    Intake/Output:   Intake/Output Summary (Last 24 hours) at 09/17/2020 1125 Last data filed at 09/17/2020 0900 Gross per 24 hour  Intake 722.91 ml  Output 3690 ml  Net -2967.09 ml      Physical Exam    CVP 2 (3rd spacing)  General:  fatigue appearing, lethargic but easily awakes and answers questions. No resp difficulty HEENT: Normal Neck: Supple. No + CVC Rt IJ . Carotids 2+ bilat; no bruits. No  lymphadenopathy or thyromegaly appreciated. Cor: PMI nondisplaced. Regular rhythm, tachy rate. No rubs, gallops or murmurs. Lungs: Clear Abdomen: Soft, nontender, nondistended. No hepatosplenomegaly. No bruits or masses. Good bowel sounds. Extremities: No cyanosis, clubbing, rash, 2+ bilateral pretibial edema bilateral unna boots Neuro: Alert & orientedx3, cranial nerves grossly intact. moves all 4 extremities w/o difficulty. Affect pleasant   Telemetry   Sinus tach, low 100s  EKG    No new ekg to review  Labs    CBC Recent Labs    09/16/20 1738 09/16/20 2106 09/17/20 0500  WBC 29.8*  --  35.1*  NEUTROABS 25.8*  --   --   HGB 8.4* 8.7* 8.7*  HCT 25.6* 26.5* 25.6*  MCV 90.1  --  88.3  PLT 72*  --  69*   Basic Metabolic Panel Recent Labs    09/16/20 0501 09/17/20 0500  NA 145 145  K 4.5 4.5  CL 112* 110  CO2 28 28  GLUCOSE 132* 140*  BUN 97* 98*  CREATININE 1.88* 1.97*  CALCIUM 7.5* 7.5*   Liver Function Tests Recent Labs    09/16/20 0501 09/17/20 0500  AST 244* 189*  ALT 576* 401*  ALKPHOS 472* 348*  BILITOT 3.4* 3.3*  PROT 4.5* 4.2*  ALBUMIN 1.7* 1.6*   No results for input(s): LIPASE,  AMYLASE in the last 72 hours. Cardiac Enzymes Recent Labs    09/15/20 0836 09/17/20 0504  CKTOTAL 4,025* 3,219*  CKMB 14.1*  --     BNP: BNP (last 3 results) Recent Labs    09/19/2020 2114 09/14/20 0031 09/15/20 0321  BNP 1,447.9* 1,523.7* 743.8*    ProBNP (last 3 results) No results for input(s): PROBNP in the last 8760 hours.   D-Dimer No results for input(s): DDIMER in the last 72 hours. Hemoglobin A1C No results for input(s): HGBA1C in the last 72 hours. Fasting Lipid Panel No results for input(s): CHOL, HDL, LDLCALC, TRIG, CHOLHDL, LDLDIRECT in the last 72 hours. Thyroid Function Tests No results for input(s): TSH, T4TOTAL, T3FREE, THYROIDAB in the last 72 hours.  Invalid input(s): FREET3  Other results:   Imaging    CT ABDOMEN PELVIS  WO CONTRAST  Result Date: 09/16/2020 CLINICAL DATA:  Question of retroperitoneal hematoma. EXAM: CT ABDOMEN AND PELVIS WITHOUT CONTRAST TECHNIQUE: Multidetector CT imaging of the abdomen and pelvis was performed following the standard protocol without IV contrast. COMPARISON:  CT chest from August 26, 2020 and CT of the abdomen and pelvis dated September 13, 2020 FINDINGS: Lower chest: RIGHT basilar and medial consolidative changes and costodiaphragmatic nodularity with similar appearance in the RIGHT chest. The LEFT basilar ground-glass above the LEFT hemidiaphragm also similar. No effusion. Decreased attenuation in cardiac blood pool compatible with anemia. Hepatobiliary: Arm position limits assessment of upper abdominal viscera. Liver contour is normal. No pericholecystic stranding. No biliary duct dilation. Pancreas: Pancreas is unremarkable grossly without contour abnormality or sign of inflammation. Spleen: Spleen normal size and contour. Adrenals/Urinary Tract: Adrenal glands are normal. Smooth renal contours. No hydronephrosis. No nephrolithiasis. Small amount of gas in the urinary bladder. Foley catheter in situ. Stomach/Bowel: Small bowel and stomach without acute process. Appendix not clearly seen. No secondary sign to suggest acute appendicitis. Vascular/Lymphatic: Calcified atheromatous plaque of the abdominal aorta. No aneurysmal dilation. Smooth contour of the IVC. There is no gastrohepatic or hepatoduodenal ligament lymphadenopathy. No retroperitoneal or mesenteric lymphadenopathy. No pelvic sidewall lymphadenopathy. Reproductive: Grossly unremarkable.  Foley catheter in place. Other: Body wall edema of the bilateral lower extremities and LEFT and RIGHT lower flank and gluteal regions perhaps slightly worse than on previous imaging, incompletely assessed. Musculoskeletal: Spinal degenerative changes. No acute or destructive bone process. IMPRESSION: 1. No evidence of retroperitoneal hematoma. 2. RIGHT  basilar and medial consolidative changes and costodiaphragmatic nodularity with similar appearance in the RIGHT chest. The LEFT basilar ground-glass above the LEFT hemidiaphragm also similar. Findings are again suggestive of multifocal pneumonia. Lower extremity edema. Not well assessed, perhaps slightly worse. 3. No signs of retroperitoneal hematoma 4. Aortic atherosclerosis. Aortic Atherosclerosis (ICD10-I70.0). Electronically Signed   By: Zetta Bills M.D.   On: 09/16/2020 15:57     Medications:     Scheduled Medications: . Chlorhexidine Gluconate Cloth  6 each Topical Daily  . furosemide  40 mg Intravenous BID  . magic mouthwash  10 mL Oral TID  . pantoprazole (PROTONIX) IV  40 mg Intravenous Q12H  . sodium chloride flush  10-40 mL Intracatheter Q12H    Infusions: . ceFEPime (MAXIPIME) IV Stopped (09/17/20 0830)  . DOBUTamine 5 mcg/kg/min (09/17/20 0900)    PRN Medications: docusate, sodium chloride flush    Patient Profile   55 y/o male w/ no prior cardiac history, admitted for mixed cardiogenic + septic shock in setting of PNA + hepatic encephalopathy.   ECHO LVEF 15% with severe global HK  with regional variability + LV clot RV moderately down. Initial Co-ox 48%. Started on DBA.    Assessment/Plan   1. Cardiogenic shock with multisystem organ failure - ECHO LVEF 15% with severe global HK with regional variability + LV clot RV moderately down. - suspect this is primary issue with MSOF as a result of low output stated and decreased mental status - on DBA 5 mcg/kg/min. Co-ox low at 45%.  However CO2 is WNL. Will check lactic acid. - has diffuse LE edema but CVP low. Suspect 3rd spacing in setting of low albumin. TSH ok. Urine negative for protein  - If lactic acid elevated, will Increase DBA to 7.5 and repeat co-ox this afternoon  - C/w gentle diuresis and Unna boots  - will need R/L cath as he recovers  2. LV thrombus - heparin currently on hold for anemia but  recommend resuming to reduce risk of embolization    3. Shock liver with hepatic encephalopathy - due to low output HF. Transaminases improving with hemodynamics support - Liver u/s suggestive of hepatic steatosis but not long-term cirrhosis. Though low albumin and low PLTs suggestive of more longstanding liver process  - continue supportive care  4. AKI due to ATN/shock - Baseline creatinine unknown  - SCr 1.5 in 2016 in Care Everywhere - Creatinine 2.1 in 3/22 -> 1.99->1.88->1.97 today  5. CAP - PCT 3.70 -> 2.9 - c/w cefepime  6. Rhabdomyolysis - improving   7. Hyperkalemia - resolved   8. Acute Anemia - Unexplained drop in Hgb from 15 on admit>> 8.4.  - CT of A/P negative for RPB  - transfused 1 unit 4/18, Hgb 8.4>>8.7 today  - WBC 35K, Plt 69K  - Associated leukocytosis + thrombocytopenia raises concern for acute hematologic process ? Leukemia. Blood smear pending. CT of chest pending.  - recommend heme consult   Length of Stay: [address] PA-C  09/17/2020, 11:25 AM  Advanced Heart Failure Team Pager [phone] (M-F; 7a - 5p)  Please contact [facility] for night-coverage after hours (5p -7a ) and weekends on [email or web address]   Agree with above.   He remains very weak. Denies SOB, orthopnea or PND. Co-ox 44% despite DBA 5. CVP low. Diuresing with IV lasix  General:  Weak appearing. No resp difficulty HEENT: normal Neck: supple. no JVD. Carotids 2+ bilat; no bruits. No lymphadenopathy or thryomegaly appreciated. Cor: PMI nondisplaced. Regular rate & rhythm. +s3  Lungs: clear Abdomen: soft, nontender, nondistended. No hepatosplenomegaly. No bruits or masses. Good bowel sounds. Extremities: no cyanosis, clubbing, rash, 2+ edema + UNNA boots  Neuro: alert & orientedx3, cranial nerves grossly intact. moves all 4 extremities but very weak . Affect pleasant  He remains quite ill with MSOF. Co-ox remains in shock range despite DBA.   I agree with CCM team  that this is very atypical presentation for advanced HF and I worry about a more systemic auto-immune/inflammatory versus malignancy.  Lab work so far non-specific. ESR 17, ANA negative, ANCA pending . CKs coming down slowly  Will continue dobutamine and IV lasix for now. Agree with Hematology and Neuro +/- Rheum consults.   Low threshold to pulse with steroids.  D/w Dr. Terald Sleeper.   CRITICAL CARE Performed by: Glori Bickers  Total critical care time: 45 minutes  Critical care time was exclusive of separately billable procedures and treating other patients.  Critical care was necessary to treat or prevent imminent or life-threatening deterioration.  Critical care was time spent personally by  me (independent of midlevel providers or residents) on the following activities: development of treatment plan with patient and/or surrogate as well as nursing, discussions with consultants, evaluation of patient's response to treatment, examination of patient, obtaining history from patient or surrogate, ordering and performing treatments and interventions, ordering and review of laboratory studies, ordering and review of radiographic studies, pulse oximetry and re-evaluation of patient's condition.  Glori Bickers, MD  1:20 PM

## 2020-09-17 NOTE — Consult Note (Signed)
[facility] Surgery Consult Note  Dennis Howard. 1966/04/13  [id number].    Requesting MD: Freda Jackson Chief Complaint/Reason for Consult: muscle biopsy  HPI:  Dennis Howard. is a 55yo male who was admitted to [facility] 4/15 with chief complaint of altered mental status. Per chart his symptoms started about 1 week prior to admission. He was found to be in cardiogenic and septic shock and was admitted to the ICU. Cardiology is following for new heart failure (ECHO LVEF 15%) and LV thrombus. He was initially started on IV heparin but this was held due to anemia and thrombocytopenia. He was also found to have elevated CK and rhabdomyolysis, acute on chronic kidney failure and congestive hepatopathy. He is currently on maxipime for pneumonia.  During admission he has had persistent BUE/BLE weakness of unknown etiology, possible myositis vs progressive neuropathy. General surgery asked to see for muscle biopsy.   ROS All systems reviewed and otherwise negative except for as above  Family History  Problem Relation Age of Onset  . CAD Neg Hx     Past Medical History:  Diagnosis Date  . Bronchitis   . Environmental allergies   . Hypertension     History reviewed. No pertinent surgical history.  Social History:  reports that he has quit smoking. He has never used smokeless tobacco. He reports previous alcohol use. He reports that he does not use drugs.  Allergies: No Known Allergies  Medications Prior to Admission  Medication Sig Dispense Refill  . albuterol (PROVENTIL HFA;VENTOLIN HFA) 108 (90 BASE) MCG/ACT inhaler Inhale 2 puffs into the lungs every 4 (four) hours as needed for wheezing or shortness of breath. 1 Inhaler 0  . atorvastatin (LIPITOR) 20 MG tablet Take 20 mg by mouth daily.    . cyclobenzaprine (FLEXERIL) 10 MG tablet Take 10 mg by mouth 3 (three) times daily.    . fish oil-omega-3 fatty acids 1000 MG capsule Take 1 g by mouth daily.    . metoCLOPramide (REGLAN)  10 MG tablet Take 10 mg by mouth 4 (four) times daily.    . Multiple Vitamins-Minerals (MULTIVITAMIN WITH MINERALS) tablet Take 1 tablet by mouth daily.    . naproxen (NAPROSYN) 500 MG tablet Take 500 mg by mouth 2 (two) times daily.    . [EXPIRED] predniSONE (DELTASONE) 10 MG tablet Take 10 mg by mouth 3 (three) times daily.    Marland Kitchen amLODipine (NORVASC) 10 MG tablet Take 1 tablet (10 mg total) by mouth daily. (Patient not taking: Reported on 09/19/2020) 30 tablet 2  . lisinopril (PRINIVIL,ZESTRIL) 20 MG tablet Take 1 tablet (20 mg total) by mouth daily. (Patient not taking: Reported on 09/01/2020) 30 tablet 2  . phenylephrine (SUDAFED PE) 10 MG TABS tablet Take 10 mg by mouth every 4 (four) hours as needed (for congestion).    . predniSONE (DELTASONE) 20 MG tablet Take 3 tablets (60 mg total) by mouth daily. (Patient not taking: No sig reported) 15 tablet 0    Prior to Admission medications   Medication Sig Start Date End Date Taking? Authorizing Provider  albuterol (PROVENTIL HFA;VENTOLIN HFA) 108 (90 BASE) MCG/ACT inhaler Inhale 2 puffs into the lungs every 4 (four) hours as needed for wheezing or shortness of breath. 03/02/12  Yes Hongalgi, Lenis Dickinson, MD  atorvastatin (LIPITOR) 20 MG tablet Take 20 mg by mouth daily.   Yes [provider]  cyclobenzaprine (FLEXERIL) 10 MG tablet Take 10 mg by mouth 3 (three) times daily. 09/04/20  Yes [provider]  fish oil-omega-3 fatty acids 1000 MG capsule Take 1 g by mouth daily.   Yes [provider]  metoCLOPramide (REGLAN) 10 MG tablet Take 10 mg by mouth 4 (four) times daily. 09/03/20  Yes [provider]  Multiple Vitamins-Minerals (MULTIVITAMIN WITH MINERALS) tablet Take 1 tablet by mouth daily.   Yes [provider]  naproxen (NAPROSYN) 500 MG tablet Take 500 mg by mouth 2 (two) times daily. 08/31/20  Yes [provider]  amLODipine (NORVASC) 10 MG tablet Take 1 tablet (10 mg total) by mouth daily. Patient  not taking: Reported on 09/15/2020 03/02/12   Modena Jansky, MD  lisinopril (PRINIVIL,ZESTRIL) 20 MG tablet Take 1 tablet (20 mg total) by mouth daily. Patient not taking: Reported on 09/28/2020 03/02/12   Modena Jansky, MD  phenylephrine (SUDAFED PE) 10 MG TABS tablet Take 10 mg by mouth every 4 (four) hours as needed (for congestion).    [provider]  predniSONE (DELTASONE) 20 MG tablet Take 3 tablets (60 mg total) by mouth daily. Patient not taking: No sig reported 09/12/12   Truddie Hidden, MD    Blood pressure 113/82, pulse (!) 108, temperature 98.42 F (36.9 C), resp. rate 18, height 6' (1.829 m), weight 76 kg, SpO2 100 %. Physical Exam: General: ill appearing male who is laying in bed in NAD HEENT: head is normocephalic, atraumatic.  Sclera are noninjected.  Pupils equal and round.  Ears and nose without any masses or lesions.  Mouth is dry. Dentition good Heart: sinus tachy.  Normal s1,s2. No obvious murmurs, gallops, or rubs noted.  Palpable pedal pulses bilaterally  Lungs: CTAB, no wheezes, rhonchi, or rales noted.  Respiratory effort nonlabored on room air Abd: soft, NT/ND, +BS, no masses, hernias, or organomegaly MS: mild BLE edema, calves soft and nontender, cdi dressings to bilateral lower legs Skin: warm and dry with no masses, lesions, or rashes Psych: A&Ox3 with an appropriate affect Neuro: cranial nerves grossly intact, moving all extremities but weakness noted to BUE and BLE, normal speech, drowsy but awakens and reponds  Results for orders placed or performed during the hospital encounter of 09/21/2020 (from the past 48 hour(s))  Glucose, capillary     Status: Abnormal   Collection Time: 09/15/20  3:37 PM  Result Value Ref Range   Glucose-Capillary 108 (H) 70 - 99 mg/dL    Comment: Glucose reference range applies only to samples taken after fasting for at least 8 hours.  Glucose, capillary     Status: Abnormal   Collection Time: 09/15/20  8:04 PM   Result Value Ref Range   Glucose-Capillary 151 (H) 70 - 99 mg/dL    Comment: Glucose reference range applies only to samples taken after fasting for at least 8 hours.  Glucose, capillary     Status: Abnormal   Collection Time: 09/15/20 11:39 PM  Result Value Ref Range   Glucose-Capillary 143 (H) 70 - 99 mg/dL    Comment: Glucose reference range applies only to samples taken after fasting for at least 8 hours.  Glucose, capillary     Status: Abnormal   Collection Time: 09/16/20  3:24 AM  Result Value Ref Range   Glucose-Capillary 125 (H) 70 - 99 mg/dL    Comment: Glucose reference range applies only to samples taken after fasting for at least 8 hours.  CBC     Status: Abnormal   Collection Time: 09/16/20  5:01 AM  Result Value Ref Range  WBC 28.1 (H) 4.0 - 10.5 K/uL   RBC 3.06 (L) 4.22 - 5.81 MIL/uL   Hemoglobin 9.4 (L) 13.0 - 17.0 g/dL    Comment: REPEATED TO VERIFY   HCT 27.7 (L) 39.0 - 52.0 %   MCV 90.5 80.0 - 100.0 fL   MCH 30.7 26.0 - 34.0 pg   MCHC 33.9 30.0 - 36.0 g/dL   RDW 18.7 (H) 11.5 - 15.5 %   Platelets 96 (L) 150 - 400 K/uL    Comment: Immature Platelet Fraction may be clinically indicated, consider ordering this additional test [id number] CONSISTENT WITH PREVIOUS RESULT REPEATED TO VERIFY    nRBC 0.1 0.0 - 0.2 %    Comment: Performed at [facility], [place] [address], [place], [place] [address]  Comprehensive metabolic panel     Status: Abnormal   Collection Time: 09/16/20  5:01 AM  Result Value Ref Range   Sodium 145 135 - 145 mmol/L   Potassium 4.5 3.5 - 5.1 mmol/L   Chloride 112 (H) 98 - 111 mmol/L   CO2 28 22 - 32 mmol/L   Glucose, Bld 132 (H) 70 - 99 mg/dL    Comment: Glucose reference range applies only to samples taken after fasting for at least 8 hours.   BUN 97 (H) 6 - 20 mg/dL   Creatinine, Ser 1.88 (H) 0.61 - 1.24 mg/dL   Calcium 7.5 (L) 8.9 - 10.3 mg/dL   Total Protein 4.5 (L) 6.5 - 8.1 g/dL   Albumin 1.7 (L) 3.5 - 5.0 g/dL   AST 244 (H)  15 - 41 U/L   ALT 576 (H) 0 - 44 U/L   Alkaline Phosphatase 472 (H) 38 - 126 U/L   Total Bilirubin 3.4 (H) 0.3 - 1.2 mg/dL   GFR, Estimated 42 (L) >60 mL/min    Comment: (NOTE) Calculated using the CKD-EPI Creatinine Equation (2021)    Anion gap 5 5 - 15    Comment: Performed at [facility], [place] [address], [place], [place] [address]  .Cooxemetry Panel (carboxy, met, total hgb, O2 sat)     Status: Abnormal   Collection Time: 09/16/20  5:01 AM  Result Value Ref Range   Total hemoglobin 9.2 (L) 12.0 - 16.0 g/dL   O2 Saturation 48.5 %   Carboxyhemoglobin 1.5 0.5 - 1.5 %   Methemoglobin 0.7 0.0 - 1.5 %    Comment: Performed at [facility], [place] [address], [place], [place] [address]  Heparin level (unfractionated)     Status: None   Collection Time: 09/16/20  5:01 AM  Result Value Ref Range   Heparin Unfractionated 0.36 0.30 - 0.70 IU/mL    Comment: (NOTE) The clinical reportable range upper limit is being lowered to >1.10 to align with the FDA approved guidance for the current laboratory assay.  If heparin results are below expected values, and patient dosage has  been confirmed, suggest follow up testing of antithrombin III levels. Performed at [facility], [place] [address], [place], [place] [address]   ABO/Rh     Status: None   Collection Time: 09/16/20  5:01 AM  Result Value Ref Range   ABO/RH(D)      A POS Performed at [place] [address], [place], [place] [address]   .Cooxemetry Panel (carboxy, met, total hgb, O2 sat)     Status: Abnormal   Collection Time: 09/16/20 11:51 AM  Result Value Ref Range   Total hemoglobin  8.5 (L) 12.0 - 16.0 g/dL   O2 Saturation 46.0 %   Carboxyhemoglobin 1.5 0.5 - 1.5 %   Methemoglobin 0.9 0.0 - 1.5 %    Comment: Performed at [place] [address], [place], [place] [address]  CBC     Status: Abnormal   Collection Time: 09/16/20 12:40 PM  Result Value Ref Range   WBC 31.0 (H) 4.0 - 10.5 K/uL    RBC 2.82 (L) 4.22 - 5.81 MIL/uL   Hemoglobin 8.3 (L) 13.0 - 17.0 g/dL   HCT 25.4 (L) 39.0 - 52.0 %   MCV 90.1 80.0 - 100.0 fL   MCH 29.4 26.0 - 34.0 pg   MCHC 32.7 30.0 - 36.0 g/dL   RDW 19.0 (H) 11.5 - 15.5 %   Platelets 75 (L) 150 - 400 K/uL    Comment: Immature Platelet Fraction may be clinically indicated, consider ordering this additional test [id number] CONSISTENT WITH PREVIOUS RESULT REPEATED TO VERIFY    nRBC 0.1 0.0 - 0.2 %    Comment: Performed at [facility], [place] [address], [place], [place] [address]  Prepare RBC (crossmatch)     Status: None   Collection Time: 09/16/20  2:19 PM  Result Value Ref Range   Order Confirmation      ORDER PROCESSED BY BLOOD BANK Performed at [facility], [place] [address], [place], [place] [address]   Type and screen [place]     Status: None   Collection Time: 09/16/20  2:50 PM  Result Value Ref Range   ABO/RH(D) A POS    Antibody Screen NEG    Sample Expiration 09/19/2020,2359    Unit Number [id number]    Blood Component Type RED CELLS,LR    Unit division 00    Status of Unit ISSUED,FINAL    Transfusion Status OK TO TRANSFUSE    Crossmatch Result      Compatible Performed at [facility], [place] [address], [place], [place] [address]   Bilirubin, direct     Status: Abnormal   Collection Time: 09/16/20  5:38 PM  Result Value Ref Range   Bilirubin, Direct 1.5 (H) 0.0 - 0.2 mg/dL    Comment: Performed at [facility] [address], [place], [place] [address]  CBC with Differential/Platelet     Status: Abnormal   Collection Time: 09/16/20  5:38 PM  Result Value Ref Range   WBC 29.8 (H) 4.0 - 10.5 K/uL   RBC 2.84 (L) 4.22 - 5.81 MIL/uL   Hemoglobin 8.4 (L) 13.0 - 17.0 g/dL   HCT 25.6 (L) 39.0 - 52.0 %   MCV 90.1 80.0 - 100.0 fL   MCH 29.6 26.0 - 34.0 pg   MCHC 32.8 30.0 - 36.0 g/dL   RDW 18.3 (H) 11.5 - 15.5 %   Platelets 72 (L) 150 - 400 K/uL    Comment: Immature Platelet  Fraction may be clinically indicated, consider ordering this additional test [id number] CONSISTENT WITH PREVIOUS RESULT REPEATED TO VERIFY    nRBC 0.1 0.0 - 0.2 %   Neutrophils Relative % 87 %   Neutro Abs 25.8 (H) 1.7 - 7.7 K/uL   Lymphocytes Relative 4 %   Lymphs Abs 1.3 0.7 - 4.0 K/uL   Monocytes Relative 8 %   Monocytes Absolute 2.5 (H) 0.1 - 1.0 K/uL   Eosinophils Relative 0 %   Eosinophils Absolute 0.0 0.0 - 0.5 K/uL   Basophils Relative  0 %   Basophils Absolute 0.0 0.0 - 0.1 K/uL   Immature Granulocytes 1 %   Abs Immature Granulocytes 0.27 (H) 0.00 - 0.07 K/uL    Comment: Performed at [facility], [place] [address], [place], [place] [address]  Lactate dehydrogenase     Status: Abnormal   Collection Time: 09/16/20  5:38 PM  Result Value Ref Range   LDH 630 (H) 98 - 192 U/L    Comment: Performed at [facility], [place] [address], [place], [place] [address]  Haptoglobin     Status: None   Collection Time: 09/16/20  5:38 PM  Result Value Ref Range   Haptoglobin 47 29 - 370 mg/dL    Comment: (NOTE) Performed At: [facility] [place], [place] [id number] Rush Farmer MD NL:[id number]   Hemoglobin and hematocrit, blood     Status: Abnormal   Collection Time: 09/16/20  9:06 PM  Result Value Ref Range   Hemoglobin 8.7 (L) 13.0 - 17.0 g/dL   HCT 26.5 (L) 39.0 - 52.0 %    Comment: Performed at [facility], [place] [address] [place], [place] [address]  CBC     Status: Abnormal   Collection Time: 09/17/20  5:00 AM  Result Value Ref Range   WBC 35.1 (H) 4.0 - 10.5 K/uL    Comment: WHITE COUNT CONFIRMED ON SMEAR   RBC 2.90 (L) 4.22 - 5.81 MIL/uL   Hemoglobin 8.7 (L) 13.0 - 17.0 g/dL   HCT 25.6 (L) 39.0 - 52.0 %   MCV 88.3 80.0 - 100.0 fL   MCH 30.0 26.0 - 34.0 pg   MCHC 34.0 30.0 - 36.0 g/dL   RDW 18.9 (H) 11.5 - 15.5 %   Platelets 69 (L) 150 - 400 K/uL    Comment: Immature Platelet Fraction may be clinically indicated,  consider ordering this additional test [id number] CONSISTENT WITH PREVIOUS RESULT REPEATED TO VERIFY    nRBC 0.2 0.0 - 0.2 %    Comment: Performed at [facility], [place] [address] [place], [place] [address]  Comprehensive metabolic panel     Status: Abnormal   Collection Time: 09/17/20  5:00 AM  Result Value Ref Range   Sodium 145 135 - 145 mmol/L   Potassium 4.5 3.5 - 5.1 mmol/L   Chloride 110 98 - 111 mmol/L   CO2 28 22 - 32 mmol/L   Glucose, Bld 140 (H) 70 - 99 mg/dL    Comment: Glucose reference range applies only to samples taken after fasting for at least 8 hours.   BUN 98 (H) 6 - 20 mg/dL   Creatinine, Ser 1.97 (H) 0.61 - 1.24 mg/dL   Calcium 7.5 (L) 8.9 - 10.3 mg/dL   Total Protein 4.2 (L) 6.5 - 8.1 g/dL   Albumin 1.6 (L) 3.5 - 5.0 g/dL   AST 189 (H) 15 - 41 U/L   ALT 401 (H) 0 - 44 U/L   Alkaline Phosphatase 348 (H) 38 - 126 U/L   Total Bilirubin 3.3 (H) 0.3 - 1.2 mg/dL   GFR, Estimated 40 (L) >60 mL/min    Comment: (NOTE) Calculated using the CKD-EPI Creatinine Equation (2021)    Anion gap 7 5 - 15    Comment: Performed at [facility], [place] [address], [place], [place] [address]  Heparin level (unfractionated)     Status: Abnormal   Collection Time: 09/17/20  5:00 AM  Result Value Ref Range  Heparin Unfractionated <0.10 (L) 0.30 - 0.70 IU/mL    Comment: (NOTE) The clinical reportable range upper limit is being lowered to >1.10 to align with the FDA approved guidance for the current laboratory assay.  If heparin results are below expected values, and patient dosage has  been confirmed, suggest follow up testing of antithrombin III levels. Performed at [facility], [place] [address], [place], [place] [address]   CK     Status: Abnormal   Collection Time: 09/17/20  5:04 AM  Result Value Ref Range   Total CK 3,219 (H) 49 - 397 U/L    Comment: Performed at [facility], [place] [address], [place], [place] [address]  C-reactive protein      Status: Abnormal   Collection Time: 09/17/20  8:04 AM  Result Value Ref Range   CRP 5.7 (H) <1.0 mg/dL    Comment: Performed at [place] [address], [place], [place] [address]  Sedimentation rate     Status: Abnormal   Collection Time: 09/17/20  8:04 AM  Result Value Ref Range   Sed Rate 17 (H) 0 - 16 mm/hr    Comment: Performed at [facility] [address] [place], [place] [address]  Ferritin     Status: None   Collection Time: 09/17/20  8:04 AM  Result Value Ref Range   Ferritin 234 24 - 336 ng/mL    Comment: Performed at [place] [address], [place], [place] [address]  Fibrinogen     Status: None   Collection Time: 09/17/20  8:04 AM  Result Value Ref Range   Fibrinogen 408 210 - 475 mg/dL    Comment: Performed at [place] [address] [place], [place] [address]  Protime-INR     Status: Abnormal   Collection Time: 09/17/20  8:04 AM  Result Value Ref Range   Prothrombin Time 17.1 (H) 11.4 - 15.2 seconds   INR 1.4 (H) 0.8 - 1.2    Comment: (NOTE) INR goal varies based on device and disease states. Performed at [facility], [place] [address] [place], [place] [address]   Cooxemetry Panel (carboxy, met, total hgb, O2 sat)     Status: Abnormal   Collection Time: 09/17/20  8:04 AM  Result Value Ref Range   Total hemoglobin 8.9 (L) 12.0 - 16.0 g/dL   O2 Saturation 44.5 %   Carboxyhemoglobin 1.7 (H) 0.5 - 1.5 %   Methemoglobin 1.3 0.0 - 1.5 %    Comment: Performed at [facility] [address], [place], [place] [address]  Ammonia     Status: None   Collection Time: 09/17/20  9:51 AM  Result Value Ref Range   Ammonia 17 9 - 35 umol/L    Comment: Performed at [facility], [place] [address], [place], [place] [address]  Save Smear     Status: None   Collection Time: 09/17/20 12:02 PM  Result Value Ref Range   Smear Review SMEAR STAINED AND AVAILABLE FOR REVIEW     Comment: Performed at [facility],  [place] [address], [place], [place] [address]  Reticulocytes     Status: Abnormal   Collection Time: 09/17/20 12:02 PM  Result Value Ref Range   Retic Ct Pct 4.3 (H) 0.4 - 3.1 %   RBC. 2.80 (L) 4.22 - 5.81 MIL/uL   Retic Count, Absolute 121.2 19.0 - 186.0 K/uL  Immature Retic Fract 25.2 (H) 2.3 - 15.9 %    Comment: Performed at [facility], [place] [address], [place], [place] [address]  Lactic acid, plasma     Status: Abnormal   Collection Time: 09/17/20 12:39 PM  Result Value Ref Range   Lactic Acid, Venous 2.9 (HH) 0.5 - 1.9 mmol/L    Comment: CRITICAL RESULT CALLED TO, READ BACK BY AND VERIFIED WITH: A.GOLDSMITH,RN 09/17/2020 1345 DAVISB Performed at [facility], [place] [address], [place], [place] [address]    CT ABDOMEN PELVIS WO CONTRAST  Result Date: 09/16/2020 CLINICAL DATA:  Question of retroperitoneal hematoma. EXAM: CT ABDOMEN AND PELVIS WITHOUT CONTRAST TECHNIQUE: Multidetector CT imaging of the abdomen and pelvis was performed following the standard protocol without IV contrast. COMPARISON:  CT chest from August 26, 2020 and CT of the abdomen and pelvis dated September 13, 2020 FINDINGS: Lower chest: RIGHT basilar and medial consolidative changes and costodiaphragmatic nodularity with similar appearance in the RIGHT chest. The LEFT basilar ground-glass above the LEFT hemidiaphragm also similar. No effusion. Decreased attenuation in cardiac blood pool compatible with anemia. Hepatobiliary: Arm position limits assessment of upper abdominal viscera. Liver contour is normal. No pericholecystic stranding. No biliary duct dilation. Pancreas: Pancreas is unremarkable grossly without contour abnormality or sign of inflammation. Spleen: Spleen normal size and contour. Adrenals/Urinary Tract: Adrenal glands are normal. Smooth renal contours. No hydronephrosis. No nephrolithiasis. Small amount of gas in the urinary bladder. Foley catheter in situ. Stomach/Bowel: Small bowel and stomach without acute  process. Appendix not clearly seen. No secondary sign to suggest acute appendicitis. Vascular/Lymphatic: Calcified atheromatous plaque of the abdominal aorta. No aneurysmal dilation. Smooth contour of the IVC. There is no gastrohepatic or hepatoduodenal ligament lymphadenopathy. No retroperitoneal or mesenteric lymphadenopathy. No pelvic sidewall lymphadenopathy. Reproductive: Grossly unremarkable.  Foley catheter in place. Other: Body wall edema of the bilateral lower extremities and LEFT and RIGHT lower flank and gluteal regions perhaps slightly worse than on previous imaging, incompletely assessed. Musculoskeletal: Spinal degenerative changes. No acute or destructive bone process. IMPRESSION: 1. No evidence of retroperitoneal hematoma. 2. RIGHT basilar and medial consolidative changes and costodiaphragmatic nodularity with similar appearance in the RIGHT chest. The LEFT basilar ground-glass above the LEFT hemidiaphragm also similar. Findings are again suggestive of multifocal pneumonia. Lower extremity edema. Not well assessed, perhaps slightly worse. 3. No signs of retroperitoneal hematoma 4. Aortic atherosclerosis. Aortic Atherosclerosis (ICD10-I70.0). Electronically Signed   By: Zetta Bills M.D.   On: 09/16/2020 15:57   CT CHEST WO CONTRAST  Result Date: 09/17/2020 CLINICAL DATA:  Pneumonia. EXAM: CT CHEST WITHOUT CONTRAST TECHNIQUE: Multidetector CT imaging of the chest was performed following the standard protocol without IV contrast. COMPARISON:  August 26, 2020. FINDINGS: Cardiovascular: No significant vascular findings. Normal heart size. No pericardial effusion. Mediastinum/Nodes: No enlarged mediastinal or axillary lymph nodes. Thyroid gland, trachea, and esophagus demonstrate no significant findings. There is noted soft tissue gas in the right supraclavicular region which may be related to insertion of right internal jugular catheter, but infection cannot be excluded. Lungs/Pleura: No  pneumothorax or pleural effusion is noted. Right lower lobe airspace opacity is noted concerning for pneumonia. Multiple ill-defined opacities are noted in the right upper lobe as well as in the left lower lobe as noted on prior exam, most consistent with multifocal pneumonia. Upper Abdomen: No acute abnormality. Musculoskeletal: No chest wall mass or suspicious bone lesions identified. IMPRESSION: Multiple opacities are noted in both lungs consistent with multifocal pneumonia.  Largest opacity is seen in the right lower lobe, most consistent with pneumonia. Soft tissue gas is noted in the right supraclavicular region which most likely is related to insertion of right internal jugular catheter, but infection cannot be excluded. Electronically Signed   By: Marijo Conception M.D.   On: 09/17/2020 13:28   Anti-infectives (From admission, onward)   Start     Dose/Rate Route Frequency Ordered Stop   09/22/2020 0600  ceFAZolin (ANCEF) IVPB 2g/100 mL premix        2 g 200 mL/hr over 30 Minutes Intravenous On call to O.R. 09/17/20 1439 09/19/20 0559   09/15/20 1800  ceFEPIme (MAXIPIME) 2 g in sodium chloride 0.9 % 100 mL IVPB        2 g 200 mL/hr over 30 Minutes Intravenous Every 12 hours 09/15/20 1636     09/14/20 0500  Ampicillin-Sulbactam (UNASYN) 3 g in sodium chloride 0.9 % 100 mL IVPB  Status:  Discontinued        3 g 200 mL/hr over 30 Minutes Intravenous Every 8 hours 09/18/2020 2201 09/15/20 1535   09/12/2020 2215  azithromycin (ZITHROMAX) 500 mg in sodium chloride 0.9 % 250 mL IVPB  Status:  Discontinued        500 mg 250 mL/hr over 60 Minutes Intravenous Every 24 hours 09/10/2020 2201 09/14/20 1156   09/15/2020 1900  cefTRIAXone (ROCEPHIN) 2 g in sodium chloride 0.9 % 100 mL IVPB        2 g 200 mL/hr over 30 Minutes Intravenous  Once 09/14/2020 1856 09/28/2020 2239   09/27/2020 1900  metroNIDAZOLE (FLAGYL) IVPB 500 mg        500 mg 100 mL/hr over 60 Minutes Intravenous  Once 09/05/2020 1856 09/22/2020 2140         Assessment/Plan Cardiogenic + septic shock  Pneumonia - on maxipime, O2 sats stable on room air Heart failure - ECHO LVEF 15% LV thrombus - heparin currently on hold for anemia Shock liver Metabolic encephalopathy AKI Elevated CK, Rhabdomyolysis Anemia Thrombocytopenia  Extremity weakness General surgery asked to see for muscle biopsy to rule out myositis vs progressive neuropathy. Cleared for surgery per primary team. We can plan on doing quadriceps muscle biopsy in the OR tomorrow. NPO after midnight. If IV heparin gets restarted this will need to be held 6 hours preop, but it is currently on hold due to anemia and thrombocytopenia. The procedure was discussed with the patient and his mother and she agrees to proceed.  ID - currently maxipime 4/17>> VTE - IV heparin on hold due to anemia and thrombocytopenia FEN - CLD, NPO after midnight Foley - none Follow up - TBD  Wellington Hampshire, [facility] 09/17/2020, 2:40 PM Please see Amion for pager number during day hours 7:00am-4:30pm

## 2020-09-18 ENCOUNTER — Inpatient Hospital Stay (HOSPITAL_COMMUNITY): Payer: BLUE CROSS/BLUE SHIELD | Admitting: Certified Registered Nurse Anesthetist

## 2020-09-18 ENCOUNTER — Encounter (HOSPITAL_COMMUNITY): Payer: Self-pay | Admitting: Internal Medicine

## 2020-09-18 ENCOUNTER — Inpatient Hospital Stay (HOSPITAL_COMMUNITY): Payer: BLUE CROSS/BLUE SHIELD

## 2020-09-18 ENCOUNTER — Encounter (HOSPITAL_COMMUNITY): Admission: EM | Disposition: E | Payer: Self-pay | Source: Home / Self Care | Attending: Pulmonary Disease

## 2020-09-18 DIAGNOSIS — N179 Acute kidney failure, unspecified: Secondary | ICD-10-CM | POA: Diagnosis not present

## 2020-09-18 DIAGNOSIS — I420 Dilated cardiomyopathy: Secondary | ICD-10-CM | POA: Diagnosis not present

## 2020-09-18 DIAGNOSIS — R570 Cardiogenic shock: Secondary | ICD-10-CM | POA: Diagnosis not present

## 2020-09-18 DIAGNOSIS — R600 Localized edema: Secondary | ICD-10-CM | POA: Diagnosis not present

## 2020-09-18 DIAGNOSIS — I5021 Acute systolic (congestive) heart failure: Secondary | ICD-10-CM | POA: Diagnosis not present

## 2020-09-18 DIAGNOSIS — R918 Other nonspecific abnormal finding of lung field: Secondary | ICD-10-CM | POA: Diagnosis not present

## 2020-09-18 DIAGNOSIS — D649 Anemia, unspecified: Secondary | ICD-10-CM | POA: Diagnosis not present

## 2020-09-18 DIAGNOSIS — M6282 Rhabdomyolysis: Secondary | ICD-10-CM

## 2020-09-18 DIAGNOSIS — D696 Thrombocytopenia, unspecified: Secondary | ICD-10-CM | POA: Diagnosis not present

## 2020-09-18 DIAGNOSIS — N189 Chronic kidney disease, unspecified: Secondary | ICD-10-CM | POA: Diagnosis not present

## 2020-09-18 HISTORY — PX: MUSCLE BIOPSY: SHX716

## 2020-09-18 LAB — COMPREHENSIVE METABOLIC PANEL
ALT: 355 U/L — ABNORMAL HIGH (ref 0–44)
AST: 177 U/L — ABNORMAL HIGH (ref 15–41)
Albumin: 1.8 g/dL — ABNORMAL LOW (ref 3.5–5.0)
Alkaline Phosphatase: 309 U/L — ABNORMAL HIGH (ref 38–126)
Anion gap: 8 (ref 5–15)
BUN: 80 mg/dL — ABNORMAL HIGH (ref 6–20)
CO2: 30 mmol/L (ref 22–32)
Calcium: 7.6 mg/dL — ABNORMAL LOW (ref 8.9–10.3)
Chloride: 108 mmol/L (ref 98–111)
Creatinine, Ser: 1.77 mg/dL — ABNORMAL HIGH (ref 0.61–1.24)
GFR, Estimated: 45 mL/min — ABNORMAL LOW (ref >60)
Glucose, Bld: 127 mg/dL — ABNORMAL HIGH (ref 70–99)
Potassium: 4.1 mmol/L (ref 3.5–5.1)
Sodium: 146 mmol/L — ABNORMAL HIGH (ref 135–145)
Total Bilirubin: 3.1 mg/dL — ABNORMAL HIGH (ref 0.3–1.2)
Total Protein: 4.6 g/dL — ABNORMAL LOW (ref 6.5–8.1)

## 2020-09-18 LAB — PHOSPHORUS: Phosphorus: 4.5 mg/dL (ref 2.5–4.6)

## 2020-09-18 LAB — CULTURE, BLOOD (ROUTINE X 2): Culture: NO GROWTH

## 2020-09-18 LAB — CBC
HCT: 24.6 % — ABNORMAL LOW (ref 39.0–52.0)
Hemoglobin: 7.9 g/dL — ABNORMAL LOW (ref 13.0–17.0)
MCH: 29.5 pg (ref 26.0–34.0)
MCHC: 32.1 g/dL (ref 30.0–36.0)
MCV: 91.8 fL (ref 80.0–100.0)
Platelets: 73 10*3/uL — ABNORMAL LOW (ref 150–400)
RBC: 2.68 MIL/uL — ABNORMAL LOW (ref 4.22–5.81)
RDW: 19.3 % — ABNORMAL HIGH (ref 11.5–15.5)
WBC: 35.2 10*3/uL — ABNORMAL HIGH (ref 4.0–10.5)
nRBC: 0.3 % — ABNORMAL HIGH (ref 0.0–0.2)

## 2020-09-18 LAB — COOXEMETRY PANEL
Carboxyhemoglobin: 1.9 % — ABNORMAL HIGH (ref 0.5–1.5)
Methemoglobin: 1.2 % (ref 0.0–1.5)
O2 Saturation: 79.1 %
Total hemoglobin: 8.1 g/dL — ABNORMAL LOW (ref 12.0–16.0)

## 2020-09-18 LAB — GLUCOSE 6 PHOSPHATE DEHYDROGENASE
G6PDH: 19.2 U/g{Hb} — ABNORMAL HIGH (ref 5.5–14.2)
Hemoglobin: 8.5 g/dL — ABNORMAL LOW (ref 13.0–17.7)

## 2020-09-18 LAB — GLUCOSE, CAPILLARY
Glucose-Capillary: 114 mg/dL — ABNORMAL HIGH (ref 70–99)
Glucose-Capillary: 114 mg/dL — ABNORMAL HIGH (ref 70–99)
Glucose-Capillary: 173 mg/dL — ABNORMAL HIGH (ref 70–99)
Glucose-Capillary: 147 mg/dL — ABNORMAL HIGH (ref 70–99)

## 2020-09-18 LAB — LEGIONELLA PNEUMOPHILA SEROGP 1 UR AG: L. pneumophila Serogp 1 Ur Ag: NEGATIVE

## 2020-09-18 LAB — ALDOLASE: Aldolase: 34.8 U/L — ABNORMAL HIGH (ref 3.3–10.3)

## 2020-09-18 LAB — ANCA TITERS
Atypical P-ANCA titer: <1:20 {titer}
C-ANCA: <1:20 {titer}
P-ANCA: <1:20 {titer}

## 2020-09-18 LAB — HEPARIN LEVEL (UNFRACTIONATED)
Heparin Unfractionated: <0.1 IU/mL — ABNORMAL LOW (ref 0.30–0.70)
Heparin Unfractionated: 0.15 IU/mL — ABNORMAL LOW (ref 0.30–0.70)

## 2020-09-18 LAB — GAMMA GT: GGT: 205 U/L — ABNORMAL HIGH (ref 7–50)

## 2020-09-18 LAB — MYCOPLASMA PNEUMONIAE ANTIBODY, IGM: Mycoplasma pneumo IgM: <770 U/mL (ref 0–769)

## 2020-09-18 LAB — MAGNESIUM: Magnesium: 2.5 mg/dL — ABNORMAL HIGH (ref 1.7–2.4)

## 2020-09-18 LAB — ADAMTS13 ACTIVITY REFLEX

## 2020-09-18 LAB — ADAMTS13 ACTIVITY: Adamts 13 Activity: 38.2 % — ABNORMAL LOW (ref >66.8)

## 2020-09-18 SURGERY — MUSCLE BIOPSY
Anesthesia: Monitor Anesthesia Care | Site: Thigh | Laterality: Left

## 2020-09-18 MED ORDER — ONDANSETRON HCL 4 MG/2ML IJ SOLN
INTRAMUSCULAR | Status: DC | PRN
  Administered 2020-09-18: 4 mg via INTRAVENOUS

## 2020-09-18 MED ORDER — ONDANSETRON HCL 4 MG/2ML IJ SOLN: 4.0000 mg | Freq: Once | INTRAMUSCULAR | Status: DC | PRN

## 2020-09-18 MED ORDER — PROSOURCE TF PO LIQD
45.0000 mL | Freq: Three times a day (TID) | ORAL | Status: DC
  Administered 2020-09-18 – 2020-09-23 (×18): 45 mL
  Filled 2020-09-18 (×19): qty 45

## 2020-09-18 MED ORDER — PANTOPRAZOLE SODIUM 40 MG IV SOLR
40.0000 mg | Freq: Two times a day (BID) | INTRAVENOUS | Status: DC
  Administered 2020-09-18 – 2020-09-19 (×4): 40 mg via INTRAVENOUS
  Filled 2020-09-18 (×4): qty 40

## 2020-09-18 MED ORDER — FLUCONAZOLE 50 MG PO TABS
50.0000 mg | ORAL_TABLET | Freq: Every day | ORAL | Status: AC
Start: 2020-09-18 — End: 2020-09-20
  Administered 2020-09-18 – 2020-09-20 (×3): 50 mg via ORAL
  Filled 2020-09-18 (×3): qty 1

## 2020-09-18 MED ORDER — OSMOLITE 1.5 CAL PO LIQD
1000.0000 mL | ORAL | Status: DC
  Administered 2020-09-18 – 2020-09-22 (×5): 1000 mL
  Filled 2020-09-18 (×5): qty 1000

## 2020-09-18 MED ORDER — PROPOFOL 500 MG/50ML IV EMUL
INTRAVENOUS | Status: DC | PRN
  Administered 2020-09-18: 50 ug/kg/min via INTRAVENOUS

## 2020-09-18 MED ORDER — PROPOFOL 10 MG/ML IV BOLUS
INTRAVENOUS | Status: AC
  Filled 2020-09-18: qty 20

## 2020-09-18 MED ORDER — ADULT MULTIVITAMIN W/MINERALS CH
1.0000 | ORAL_TABLET | Freq: Every day | ORAL | Status: DC
  Administered 2020-09-18 – 2020-09-23 (×6): 1 via ORAL
  Filled 2020-09-18 (×7): qty 1

## 2020-09-18 MED ORDER — ACETAMINOPHEN 10 MG/ML IV SOLN: 1000.0000 mg | Freq: Once | INTRAVENOUS | Status: DC | PRN

## 2020-09-18 MED ORDER — PHENYLEPHRINE HCL-NACL 10-0.9 MG/250ML-% IV SOLN
INTRAVENOUS | Status: DC | PRN
  Administered 2020-09-18: 25 ug/min via INTRAVENOUS

## 2020-09-18 MED ORDER — CEFAZOLIN SODIUM 1 G IJ SOLR
INTRAMUSCULAR | Status: AC
  Filled 2020-09-18: qty 20

## 2020-09-18 MED ORDER — PROPOFOL 1000 MG/100ML IV EMUL
INTRAVENOUS | Status: AC
  Filled 2020-09-18: qty 100

## 2020-09-18 MED ORDER — FENTANYL CITRATE (PF) 250 MCG/5ML IJ SOLN
INTRAMUSCULAR | Status: AC
  Filled 2020-09-18: qty 5

## 2020-09-18 MED ORDER — FENTANYL CITRATE (PF) 100 MCG/2ML IJ SOLN: 25.0000 ug | INTRAMUSCULAR | Status: DC | PRN

## 2020-09-18 MED ORDER — FENTANYL CITRATE (PF) 100 MCG/2ML IJ SOLN
INTRAMUSCULAR | Status: DC | PRN
  Administered 2020-09-18: 50 ug via INTRAVENOUS

## 2020-09-18 MED ORDER — LIDOCAINE 4 % EX CREA
TOPICAL_CREAM | Freq: Two times a day (BID) | CUTANEOUS | Status: DC
  Administered 2020-09-18: 1 via TOPICAL
  Filled 2020-09-18: qty 5

## 2020-09-18 MED ORDER — BUPIVACAINE-EPINEPHRINE 0.25% -1:200000 IJ SOLN
INTRAMUSCULAR | Status: DC | PRN
  Administered 2020-09-18: 20 mL

## 2020-09-18 MED ORDER — SODIUM CHLORIDE 0.9 % IV SOLN: INTRAVENOUS | Status: DC | PRN

## 2020-09-18 MED ORDER — CAPSAICIN 0.025 % EX CREA
TOPICAL_CREAM | Freq: Two times a day (BID) | CUTANEOUS | Status: DC
  Administered 2020-09-18: 1 g via TOPICAL
  Filled 2020-09-18 (×2): qty 60

## 2020-09-18 MED ORDER — CHLORHEXIDINE GLUCONATE CLOTH 2 % EX PADS
6.0000 | MEDICATED_PAD | Freq: Every day | CUTANEOUS | Status: DC
  Administered 2020-09-19 – 2020-10-01 (×17): 6 via TOPICAL

## 2020-09-18 MED ORDER — SODIUM CHLORIDE (PF) 0.9 % IJ SOLN
INTRAMUSCULAR | Status: AC
  Filled 2020-09-18: qty 10

## 2020-09-18 MED ORDER — 0.9 % SODIUM CHLORIDE (POUR BTL) OPTIME
TOPICAL | Status: DC | PRN
  Administered 2020-09-18: 1000 mL

## 2020-09-18 MED ORDER — ARTIFICIAL TEARS OPHTHALMIC OINT
TOPICAL_OINTMENT | OPHTHALMIC | Status: AC
  Filled 2020-09-18: qty 3.5

## 2020-09-18 MED ORDER — MIDAZOLAM HCL 2 MG/2ML IJ SOLN
INTRAMUSCULAR | Status: AC
  Filled 2020-09-18: qty 2

## 2020-09-18 MED ORDER — BUPIVACAINE-EPINEPHRINE (PF) 0.25% -1:200000 IJ SOLN
INTRAMUSCULAR | Status: AC
  Filled 2020-09-18: qty 30

## 2020-09-18 MED ORDER — ONDANSETRON HCL 4 MG/2ML IJ SOLN
INTRAMUSCULAR | Status: AC
  Filled 2020-09-18: qty 2

## 2020-09-18 MED ORDER — METHYLPREDNISOLONE SODIUM SUCC 125 MG IJ SOLR
125.0000 mg | Freq: Four times a day (QID) | INTRAMUSCULAR | Status: DC
  Administered 2020-09-18 – 2020-09-22 (×15): 125 mg via INTRAVENOUS
  Filled 2020-09-18 (×15): qty 2

## 2020-09-18 MED ORDER — LIDOCAINE 5 % EX OINT
TOPICAL_OINTMENT | Freq: Two times a day (BID) | CUTANEOUS | Status: DC
  Administered 2020-09-18: 1 via TOPICAL
  Filled 2020-09-18 (×4): qty 35.44

## 2020-09-18 MED ORDER — THIAMINE HCL 100 MG/ML IJ SOLN
100.0000 mg | Freq: Every day | INTRAMUSCULAR | Status: DC
  Administered 2020-09-18 – 2020-10-02 (×15): 100 mg via INTRAVENOUS
  Filled 2020-09-18 (×15): qty 2

## 2020-09-18 SURGICAL SUPPLY — 31 items
BLADE CLIPPER SURG (BLADE) IMPLANT
BNDG GAUZE ELAST 4 BULKY (GAUZE/BANDAGES/DRESSINGS) IMPLANT
CANISTER SUCT 3000ML PPV (MISCELLANEOUS) ×2 IMPLANT
COVER SURGICAL LIGHT HANDLE (MISCELLANEOUS) ×2 IMPLANT
COVER WAND RF STERILE (DRAPES) ×2 IMPLANT
DERMABOND ADVANCED (GAUZE/BANDAGES/DRESSINGS) ×1
DERMABOND ADVANCED .7 DNX12 (GAUZE/BANDAGES/DRESSINGS) ×1 IMPLANT
DRAPE LAPAROSCOPIC ABDOMINAL (DRAPES) IMPLANT
DRAPE LAPAROTOMY 100X72 PEDS (DRAPES) IMPLANT
DRSG PAD ABDOMINAL 8X10 ST (GAUZE/BANDAGES/DRESSINGS) IMPLANT
DRSG TELFA 3X8 NADH (GAUZE/BANDAGES/DRESSINGS) ×2 IMPLANT
ELECT REM PT RETURN 9FT ADLT (ELECTROSURGICAL) ×2
ELECTRODE REM PT RTRN 9FT ADLT (ELECTROSURGICAL) ×1 IMPLANT
GAUZE SPONGE 4X4 12PLY STRL (GAUZE/BANDAGES/DRESSINGS) IMPLANT
GLOVE SURG SS PI 7.0 STRL IVOR (GLOVE) ×2 IMPLANT
GLOVE SURG UNDER POLY LF SZ7 (GLOVE) ×2 IMPLANT
GOWN STRL REUS W/ TWL LRG LVL3 (GOWN DISPOSABLE) ×2 IMPLANT
GOWN STRL REUS W/TWL LRG LVL3 (GOWN DISPOSABLE) ×4
KIT BASIN OR (CUSTOM PROCEDURE TRAY) ×2 IMPLANT
KIT TURNOVER KIT B (KITS) ×2 IMPLANT
NEEDLE 22X1 1/2 (OR ONLY) (NEEDLE) ×2 IMPLANT
NS IRRIG 1000ML POUR BTL (IV SOLUTION) ×2 IMPLANT
PACK GENERAL/GYN (CUSTOM PROCEDURE TRAY) ×2 IMPLANT
PAD ARMBOARD 7.5X6 YLW CONV (MISCELLANEOUS) ×2 IMPLANT
PENCIL SMOKE EVACUATOR (MISCELLANEOUS) ×2 IMPLANT
SUT MNCRL AB 4-0 PS2 18 (SUTURE) ×2 IMPLANT
SUT VIC AB 3-0 SH 18 (SUTURE) ×2 IMPLANT
SWAB COLLECTION DEVICE MRSA (MISCELLANEOUS) IMPLANT
SWAB CULTURE ESWAB REG 1ML (MISCELLANEOUS) IMPLANT
TOWEL GREEN STERILE (TOWEL DISPOSABLE) ×2 IMPLANT
TOWEL GREEN STERILE FF (TOWEL DISPOSABLE) ×2 IMPLANT

## 2020-09-18 NOTE — Progress Notes (Signed)
IP PROGRESS NOTE  Subjective:   Mr. Custard is alert.  No bleeding.  He reports persistent foot pain.  Objective: Vital signs in last 24 hours: Blood pressure 122/87, pulse (!) 105, temperature (!) 97.1 F (36.2 C), resp. rate 17, height 6' (1.829 m), weight 170 lb 10.2 oz (77.4 kg), SpO2 99 %.  Intake/Output from previous day: 04/19 0701 - 04/20 0700 In: 1219.2 [P.O.:720; I.V.:199.1; IV Piggyback:300.1] Out: 3535 [Urine:3535]  Physical Exam:  HEENT: Thrush at the posterior palate and buccal mucosa, ulcerations at the lateral aspect of the bilateral tongue Abdomen: No hepatosplenomegaly, nontender Extremities: Wrappings in place at the lower leg bilaterally.  Slight purplish discoloration at the right great toe Neurologic: Alert, follows simple commands, moves all extremities to command, oriented to place, year, and address   Right IJ catheter site without evidence of infection or bleeding  Lab Results: Recent Labs    09/17/20 1720 09/23/2020 0210  WBC 34.0* 35.2*  HGB 8.0* 7.9*  HCT 23.7* 24.6*  PLT 71* 73*    BMET Recent Labs    09/17/20 0500 09/19/2020 0210  NA 145 146*  K 4.5 4.1  CL 110 108  CO2 28 30  GLUCOSE 140* 127*  BUN 98* 80*  CREATININE 1.97* 1.77*  CALCIUM 7.5* 7.6*    No results found for: CEA1  Studies/Results: CT ABDOMEN PELVIS WO CONTRAST  Result Date: 09/16/2020 CLINICAL DATA:  Question of retroperitoneal hematoma. EXAM: CT ABDOMEN AND PELVIS WITHOUT CONTRAST TECHNIQUE: Multidetector CT imaging of the abdomen and pelvis was performed following the standard protocol without IV contrast. COMPARISON:  CT chest from August 26, 2020 and CT of the abdomen and pelvis dated September 13, 2020 FINDINGS: Lower chest: RIGHT basilar and medial consolidative changes and costodiaphragmatic nodularity with similar appearance in the RIGHT chest. The LEFT basilar ground-glass above the LEFT hemidiaphragm also similar. No effusion. Decreased attenuation in cardiac  blood pool compatible with anemia. Hepatobiliary: Arm position limits assessment of upper abdominal viscera. Liver contour is normal. No pericholecystic stranding. No biliary duct dilation. Pancreas: Pancreas is unremarkable grossly without contour abnormality or sign of inflammation. Spleen: Spleen normal size and contour. Adrenals/Urinary Tract: Adrenal glands are normal. Smooth renal contours. No hydronephrosis. No nephrolithiasis. Small amount of gas in the urinary bladder. Foley catheter in situ. Stomach/Bowel: Small bowel and stomach without acute process. Appendix not clearly seen. No secondary sign to suggest acute appendicitis. Vascular/Lymphatic: Calcified atheromatous plaque of the abdominal aorta. No aneurysmal dilation. Smooth contour of the IVC. There is no gastrohepatic or hepatoduodenal ligament lymphadenopathy. No retroperitoneal or mesenteric lymphadenopathy. No pelvic sidewall lymphadenopathy. Reproductive: Grossly unremarkable.  Foley catheter in place. Other: Body wall edema of the bilateral lower extremities and LEFT and RIGHT lower flank and gluteal regions perhaps slightly worse than on previous imaging, incompletely assessed. Musculoskeletal: Spinal degenerative changes. No acute or destructive bone process. IMPRESSION: 1. No evidence of retroperitoneal hematoma. 2. RIGHT basilar and medial consolidative changes and costodiaphragmatic nodularity with similar appearance in the RIGHT chest. The LEFT basilar ground-glass above the LEFT hemidiaphragm also similar. Findings are again suggestive of multifocal pneumonia. Lower extremity edema. Not well assessed, perhaps slightly worse. 3. No signs of retroperitoneal hematoma 4. Aortic atherosclerosis. Aortic Atherosclerosis (ICD10-I70.0). Electronically Signed   By: Donzetta Kohut M.D.   On: 09/16/2020 15:57   CT CHEST WO CONTRAST  Result Date: 09/17/2020 CLINICAL DATA:  Pneumonia. EXAM: CT CHEST WITHOUT CONTRAST TECHNIQUE: Multidetector CT  imaging of the chest was performed following the standard  protocol without IV contrast. COMPARISON:  August 26, 2020. FINDINGS: Cardiovascular: No significant vascular findings. Normal heart size. No pericardial effusion. Mediastinum/Nodes: No enlarged mediastinal or axillary lymph nodes. Thyroid gland, trachea, and esophagus demonstrate no significant findings. There is noted soft tissue gas in the right supraclavicular region which may be related to insertion of right internal jugular catheter, but infection cannot be excluded. Lungs/Pleura: No pneumothorax or pleural effusion is noted. Right lower lobe airspace opacity is noted concerning for pneumonia. Multiple ill-defined opacities are noted in the right upper lobe as well as in the left lower lobe as noted on prior exam, most consistent with multifocal pneumonia. Upper Abdomen: No acute abnormality. Musculoskeletal: No chest wall mass or suspicious bone lesions identified. IMPRESSION: Multiple opacities are noted in both lungs consistent with multifocal pneumonia. Largest opacity is seen in the right lower lobe, most consistent with pneumonia. Soft tissue gas is noted in the right supraclavicular region which most likely is related to insertion of right internal jugular catheter, but infection cannot be excluded. Electronically Signed   By: Lupita Raider M.D.   On: 09/17/2020 13:28    Medications: I have reviewed the patient's current medications.  Assessment/Plan: 1.  Pulmonary infiltrates 2.  Chronic renal failure 3.  Cardiogenic shock on hospital admission 4.  Liver failure secondary to cardiogenic shock, underlying chronic liver disease? 5.  Anemia 6.  Thrombocytopenia 7.   Coagulopathy 8.   Altered mental status-hepatic encephalopathy? 9.   Rhabdomyolysis 10.  Oral candidiasis  Mr. Hanner appears stable.  The platelet count is stable.  The liver enzymes continue to improve.  The hemoglobin is slightly lower today.  The haptoglobin  returned normal.  I continue to feel it is unlikely that he has significant hemolysis.  Hematologic findings are most likely related to a systemic inflammatory condition.  He is scheduled for muscle biopsy today.  Recommendations:  1.  Continue supportive care per the critical care service. 2.  Daily CBC 3.  Follow-up muscle biopsy from today 4.  Continue evaluation of weakness per neurology     LOS: 5 days   Thornton Papas, MD   09/20/2020, 10:42 AM

## 2020-09-18 NOTE — Progress Notes (Signed)
NAME:  Dennis Hubbert., MRN:  [id number], DOB:  11/20/1965, LOS: 5 ADMISSION DATE:  09/06/2020, CONSULTATION DATE: 09/28/2020 REFERRING MD: Wilkie Aye - EM CHIEF COMPLAINT:  Altered Mental Status  History of Present Illness:  55 y/o man with a past medical history of chronic kidney disease, hypertension who presented to [facility] on 4/15 with CC AMS.  Symptoms started a week prior to admit.  His baseline mental status is alert and oriented to person place time and event.  Mental status changes were described as somnolent and decreased responsiveness.  He has associated lower extremity swelling, weight gain, shortness of breath, and cough. No associated chest pain, fever, syncope. He has never had these symptoms before.  He was recently seen at [facility] emergency department on 08/18/2020 for cough and shortness of breath.  He was diagnosed with pneumonia and discharged home with doxycycline.  He returned to [facility] 4/15 with altered mental status and lower extremity swelling which were not present at the time of his emergency department visit.  He works for [employer].  Former tobacco smoker.  Quit years ago.  No alcohol.  Marijuana daily use per patient for ~ 20 years.    Pertinent  Medical History  CKD III - sr cr 2013 1.6 HTN Bronchitis  Significant Hospital Events: Including procedures, antibiotic start and stop dates in addition to other pertinent events   . 4/15 Admitted to ICU. Treated with azithro, ceftriaxone, flagyl, unasyn. Tylenol, acetaminophen, ETOH, influenza, COVID, hepatitis, urine strep antigen, legionella, & UDS negative.  . 4/16 ECHO with global hypokinesis, anteroseptal wall and apex are akinetic, anterior wall severely hypokinetic, very large layered semi-mobile apical thrombus extending from the apex along the anteroseptal wall, LVEF ~15-20%, RV systolic function mild to moderately reduced.  tarted on dobuatmine drip. ANA negative.  . 4/17 Unasyn stopped, cefepime initiated  . 4/18  Dobutamine at , net neg 1.7L, tolerating soft diet, no n/v . 4/19 Dobutamine increased to 7.8mcg  Interim History / Subjective:   I/O  -2.3L in last 24 hours (-10.9L overall) On dobutamine at 7.74mcg, SvO2 79  No acute events overnight. Patient tolerated muscle biopsy well this AM. He is traveling down for brain MRI.   Objective   Blood pressure 122/86, pulse (!) 109, temperature 98.2 F (36.8 C), temperature source Axillary, resp. rate 11, height 6' (1.829 m), weight 77.4 kg, SpO2 99 %. CVP:  [3 mmHg-7 mmHg] 7 mmHg      Intake/Output Summary (Last 24 hours) at 10/18/20 1242 Last data filed at 10-18-20 1200 Gross per 24 hour  Intake 1168.98 ml  Output 3610 ml  Net -2441.02 ml   Filed Weights   09/16/20 0500 09/17/20 0500 10/18/2020 0144  Weight: 75.7 kg 76 kg 77.4 kg    Examination: General: ill appearing adult male sitting up in bed in NAD HEENT: MM pink/moist, anicteric, good dentition  Neuro: Awakens, mild drowsiness, weakness greatest in lower extremities CV: s1s2 regular rate and rhythm, no m/r/g PULM: non-labored at rest, lungs bilaterally clear anterior GI: soft, bsx4 active  Extremities: warm/dry, BLE 1-2+ edema, lower extremity wraps in place.  Skin: Ecchymotic appearing lesions on toes.    Resolved Hospital Problem list   Encephalopathy  Assessment & Plan:   Cardiogenic Shock Acute Biventricular Heart Failure LV Thrombus Unknown etiology of heart failure at this time. Ischemic vs infectious vs inflammatory.  Concern that this is not a typical presentation of heart failure, query underlying autoimmune process or malignancy.  -appreciate Cardiology evaluation  -  continue dobutamine -resume heparin gtt -follow up antiphospholipid antibody from 4/16  -continue lasix 40 mg BID  -follow SvO2  -will likely need L/RHC at some point  -f/u myositis panel, sed rate, mycoplasma antibody, aldolase.  -Will start steroids today now that serologic workup and muscle  biopsy has been obtained  Sepsis due to Pneumonia Aspiration Pneumonia Multifocal opacities on chest CT concerning for infectious vs inflammatory lesions -Continue cefepime, abx changed 4/17. Plan for 7 days duration  -follow intermittent CXR  Congestive Hepatopathy -follow LFT's, improved -cortrak for nutrition   Acute on Chronic Kidney Disease stage IIIA-B Lactic Acidosis Note uses NSAIDS, ACE-I at home and had contrast on 3/28 -lasix as above for CHF  -Trend BMP / urinary output -Replace electrolytes as indicated -Avoid nephrotoxic agents as able, ensure adequate renal perfusion   Anemia  Thrombocytopenia  Thought in setting of hepatic congestion  -monitor trend -heparin on hold  -transfuse for Hgb <7% - Hematology consulted, low concern for TTP at this time  Elevated Creatinine Kinase / Rhabdomyolysis On statin at PTA  -follow CK  Acute Metabolic Encephalopathy  -supportive care, improved -follow up ammonia   Moderate Malnutrition  -tube feeds  Thrush -magic mouthwash TID   Generalized weakness Thought secondary to inflammatory process. Neurology consulted for further evaluation. - MRI brain today - plan to start steroids as above.  Best practice (right click and "Reselect all SmartList Selections" daily)  Diet:  Tube Feed  Pain/Anxiety/Delirium protocol (if indicated): No VAP protocol (if indicated): Not indicated DVT prophylaxis: Systemic AC GI prophylaxis: PPI Glucose control:  SSI No Central venous access:  Yes, and it is still needed Arterial line:  N/A Foley:  N/A,  Mobility:  bed rest  PT consulted: N/A Last date of multidisciplinary goals of care discussion - N/A Code Status:  full code Disposition: ICU  Labs   CBC: Recent Labs  Lab 08/31/2020 1634 09/09/2020 1839 09/16/20 1240 09/16/20 1738 09/16/20 2106 09/17/20 0500 09/17/20 1720 29-Sep-2020 0210  WBC 18.4*   < > 31.0* 29.8*  --  35.1* 34.0* 35.2*  NEUTROABS 16.2*  --   --  25.8*  --    --   --   --   HGB 14.2   < > 8.3* 8.4* 8.7* 8.7* 8.0* 7.9*  HCT 40.3   < > 25.4* 25.6* 26.5* 25.6* 23.7* 24.6*  MCV 83.6   < > 90.1 90.1  --  88.3 89.4 91.8  PLT 121*   < > 75* 72*  --  69* 71* 73*   < > = values in this interval not displayed.    Basic Metabolic Panel: Recent Labs  Lab 09/04/2020 2300 09/14/20 0031 09/14/20 0940 09/15/20 0321 09/16/20 0501 09/17/20 0500 Sep 29, 2020 0210  NA  --  135 141 144 145 145 146*  K  --  5.9* 4.8 4.9 4.5 4.5 4.1  CL  --  103 105 111 112* 110 108  CO2  --  21* 19* 25 28 28 30   GLUCOSE  --  122* 135* 137* 132* 140* 127*  BUN  --  99* 107* 96* 97* 98* 80*  CREATININE  --  1.86* 2.07* 1.99* 1.88* 1.97* 1.77*  CALCIUM  --  7.7* 7.8* 7.6* 7.5* 7.5* 7.6*  MG 2.8* 2.8*  --   --   --   --   --   PHOS 4.2 4.0  --   --   --   --   --    GFR:  Estimated Creatinine Clearance: 52.8 mL/min (A) (by C-G formula based on SCr of 1.77 mg/dL (H)). Recent Labs  Lab 09-14-2020 1802 September 14, 2020 2114 09/14/20 0031 09/14/20 0940 09/15/20 0321 09/15/20 0836 09/16/20 0501 09/16/20 1738 09/17/20 0500 09/17/20 1239 09/17/20 1720 09/17/2020 0210  PROCALCITON  --   --   --  3.70  --  2.90  --   --   --   --   --   --   WBC  --   --    < > 25.3*   < >  --    < > 29.8* 35.1*  --  34.0* 35.2*  LATICACIDVEN 2.2* 2.7*  --   --   --   --   --   --   --  2.9* 2.2*  --    < > = values in this interval not displayed.    Liver Function Tests: Recent Labs  Lab 09/14/20 0031 09/15/20 0321 09/16/20 0501 09/17/20 0500 09/01/2020 0210  AST 783* 476* 244* 189* 177*  ALT 1,119* 833* 576* 401* 355*  ALKPHOS 880* 689* 472* 348* 309*  BILITOT 5.9* 4.6* 3.4* 3.3* 3.1*  PROT 5.4* 4.9* 4.5* 4.2* 4.6*  ALBUMIN 2.2* 1.9* 1.7* 1.6* 1.8*   Recent Labs  Lab September 14, 2020 2300  LIPASE 142*   Recent Labs  Lab 14-Sep-2020 1634 09/17/20 0951  AMMONIA 65* 17    ABG    Component Value Date/Time   PHART 7.437 09/14/2020 2258   PCO2ART 29.3 (L) 09/14/20 2258   PO2ART 50 (L)  2020-09-14 2258   HCO3 21.7 09/14/2020 0031   TCO2 21 (L) 09/14/2020 2258   ACIDBASEDEF 1.8 09/14/2020 0031   O2SAT 79.1 08/31/2020 0246     Coagulation Profile: Recent Labs  Lab 2020/09/14 2128 09/17/20 0804  INR 1.7* 1.4*    Cardiac Enzymes: Recent Labs  Lab 09/14/20 0031 09/14/20 0940 09/15/20 0836 09/17/20 0504  CKTOTAL 5,299* 6,502* 4,025* 3,219*  CKMB  --   --  14.1*  --     HbA1C: No results found for: HGBA1C  CBG: Recent Labs  Lab 09/15/20 1537 09/15/20 2004 09/15/20 2339 09/16/20 0324 09/14/2020 0745  GLUCAP 108* 151* 143* 125* 114*      Critical care time: 45 minutes    Melody Comas, MD  Pulmonary & Critical Care Office: [phone]   See Amion for personal pager PCCM on call pager [phone] until 7pm. Please call Elink 7p-7a. [phone]

## 2020-09-18 NOTE — Procedures (Signed)
Cortrak  Person Inserting Tube:  Chelci Wintermute C, RD Tube Type:  Cortrak - 43 inches Tube Location:  Left nare Initial Placement:  Stomach Secured by: Bridle Technique Used to Measure Tube Placement:  Documented cm marking at nare/ corner of mouth Cortrak Secured At:  67 cm    Cortrak Tube Team Note:  Consult received to place a Cortrak feeding tube.   No x-ray is required. RN may begin using tube.   If the tube becomes dislodged please keep the tube and contact the Cortrak team at [email or web address] ([email or web address]) for replacement.  If after hours and replacement cannot be delayed, place a NG tube and confirm placement with an abdominal x-ray.    Dublin Grayer P., RD, LDN, CNSC See AMiON for contact information    

## 2020-09-18 NOTE — Op Note (Signed)
Preoperative diagnosis: weakness  Postoperative diagnosis: same   Procedure: quadriceps muscle biopsy  Surgeon: Gurney Maxin, M.D.  Asst: none  Anesthesia: MAC  Indications for procedure: Dennis Howard. is a 55 y.o. year old male with symptoms of progressive weakness.  Description of procedure: The patient was brought into the operative suite. Anesthesia was administered with Monitored Local Anesthesia with Sedation. WHO checklist was applied. The patient was then placed in supine position. The area was prepped and draped in the usual sterile fashion.  Next, an area over the anterior left thigh was chosen and anesthetized with Marcaine with epi. A longitudinal incision was made and cautery was used to divide the subcutaneous tissue and fascia. A 2 x 3 cm piece of muscle was excised. Cautery was used for hemostasis. The fascia was closed with interrupted 3-0 vicryl. The skin was closed with running 4-0 monocryl subcuticular suture. Dermabond was placed for dressing. The patient was brought to PACU with plan to return to the intensive care unit.  Findings: edematous tissue  Specimen: left quadriceps muscle biopsy  Implant: none   Blood loss: <5 ml  Local anesthesia: 20 ml Marcaine w epi  Complications: none  Gurney Maxin, M.D. General, Bariatric, & Minimally Invasive Surgery [facility]

## 2020-09-18 NOTE — Progress Notes (Addendum)
Neurology Progress Note  Patient ID: Dennis Howard. is a 55 y.o. with PMHx of  has a past medical history of Bronchitis, Environmental allergies, and Hypertension.  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%), C/B lower extremity edema  LV thrombus  Cardiogenic shock on dobutamine Inflammatory pulmonary infiltrate of unclear etiology, with cough Hepatic dysfunction (elevated GGT, INR, liver enzymes) Leukocytosis, thrombocytopenia, anemia Severe malnutrition (reduced appetite and sore tongue / thrush)  Major interval events:  Muscle biopsy completed Core track tube placed  Subjective: Examined postoperatively, slightly odd affect stating he thought he went to heaven and there were 72 virgins, then states he was joking  Exam: Vitals:   10/18/2020 0920 18-Oct-2020 0934  BP: 120/86 122/87  Pulse:    Resp: 13 17  Temp:  (!) 97.1 F (36.2 C)  SpO2:     Gen: In bed, comfortable  Resp: non-labored breathing, no grossly audible wheezing Cardiac: Perfusing extremities well  Abd: soft, nt Extremities: Bilateral lower extremities remain wrapped and exquisitely tender to touch Psychiatric: Odd affect  Neuro: MS: Drowsy but awakens easily, follows simple commands, at times tangential and answers.  Able to tell me that he just had a muscle biopsy done. CN: Face symmetric, EOMI, tongue midline Motor: Remains 4+/5 in the deltoids and 5/5 distally in the upper extremities, 3/5 in ankle plantar and dorsiflexion with significant encouragement but weak proximally in the thighs Sensory: Severe allodynia in the bilateral lower extremities DTR: Depressed in the bilateral lower extremities  Pertinent Labs:  Basic Metabolic Panel: Recent Labs  Lab 09/11/2020 2300 09/14/20 0031 09/14/20 0940 09/15/20 0321  09/16/20 0501 09/17/20 0500 18-Oct-2020 0210  NA  --  135 141 144 145 145 146*  K  --  5.9* 4.8 4.9 4.5 4.5 4.1  CL  --  103 105 111 112* 110 108  CO2  --  21* 19* 25 28 28 30   GLUCOSE  --  122* 135* 137* 132* 140* 127*  BUN  --  99* 107* 96* 97* 98* 80*  CREATININE  --  1.86* 2.07* 1.99* 1.88* 1.97* 1.77*  CALCIUM  --  7.7* 7.8* 7.6* 7.5* 7.5* 7.6*  MG 2.8* 2.8*  --   --   --   --   --   PHOS 4.2 4.0  --   --   --   --   --     CBC: Recent Labs  Lab 09/16/2020 1634 09/27/2020 1839 09/16/20 1240 09/16/20 1738 09/16/20 2106 09/17/20 0500 09/17/20 1720 Oct 18, 2020 0210  WBC 18.4*   < > 31.0* 29.8*  --  35.1* 34.0* 35.2*  NEUTROABS 16.2*  --   --  25.8*  --   --   --   --   HGB 14.2   < > 8.3* 8.4* 8.7* 8.7* 8.0* 7.9*  HCT 40.3   < > 25.4* 25.6* 26.5* 25.6* 23.7* 24.6*  MCV 83.6   < > 90.1 90.1  --  88.3 89.4 91.8  PLT 121*   < > 75* 72*  --  69* 71* 73*   < > = values in this interval not displayed.    Coagulation Studies: Recent Labs    09/17/20 0804  LABPROT 17.1*  INR 1.4*    Improving from an INR of 1.7   Impression: At this time, the patient's examination is stable, and  his CK elevation in general strength have improved over the course of his hospitalization though he continues to have a brisk unexplained leukocytosis, worsening pulmonary infiltrates, dobutamine support.  Etiology for his myopathic process is unclear at this time.  Differential includes inflammatory processes for which myositis panel has been sent.  Given that his status has been improving from a CK standpoint, possibility of a toxic exposure also exist.  On additional history obtained today it appears he also had very poor appetite for many weeks leading up to his hospitalization.  This brings up the possibility of nutritional contributions to his state.  For example beri beri can lead to neuropathy, myopathy, including cardiomyopathy and lower extremity edema, though would not explain his lung infiltrates.  Critical illness myopathy/neuropathy less likely given highly elevated CK, typically only mild elevations are seen in that setting.  With LV thrombus and severely reduced EF, as well as initial hypotension there is additionally possibility of multifocal strokes or watershed infarcts which can account for some of his proximal muscle weakness (though overall examination localizes better to muscle/nerve); obtaining an MRI brain for this possibility  Given concern for systemic inflammatory process, trial of steroids is reasonable though there is a possibility of worsening heart failure, defer to CCM and cardiology   Recommendations: - Follow-up [facility] myositis panel (mild marker 3 profile), pending - MRI brain w/o contrast to evaluate for watershed infarcts - EMG/NCS could be helpful but available only as an outpatient study at this time  - Follow-up muscle biopsy - Start thiamine 100 mg daily empirically (unfortunately level ordered was not collected prior to starting the medication therefore canceled the lab as it will not be diagnostic) - B6 level, supplement if it returns low - For pain control, will try topical lidocaine followed by capsaicin, lidocaine should be applied 30 to 60 minutes prior to capsaicin - Appreciate hematology following - Neurology will continue to follow  Brooke Dare MD-PhD [facility] [phone]   CRITICAL CARE Performed by: Gordy Councilman  Total critical care time: 33 minutes  Critical care time was exclusive of separately billable procedures and treating other patients.  Critical care was necessary to treat or prevent imminent or life-threatening deterioration.  Critical care was time spent personally by me on the following activities: development of treatment plan with patient and/or surrogate as well as nursing, discussions with consultants, evaluation of patient's response to treatment, examination of patient, obtaining history from  patient or surrogate, ordering and performing treatments and interventions, ordering and review of laboratory studies, ordering and review of radiographic studies, pulse oximetry and re-evaluation of patient's condition.

## 2020-09-18 NOTE — Anesthesia Procedure Notes (Signed)
Procedure Name: MAC Date/Time: 09/14/2020 8:28 AM Performed by: Alain Marion, CRNA Pre-anesthesia Checklist: Patient identified, Emergency Drugs available, Suction available and Patient being monitored Patient Re-evaluated:Patient Re-evaluated prior to induction Oxygen Delivery Method: Simple face mask Placement Confirmation: positive ETCO2

## 2020-09-18 NOTE — Progress Notes (Signed)
Nutrition Follow-up  RD working remotely.  DOCUMENTATION CODES:   Severe malnutrition in context of acute illness/injury  INTERVENTION:   Initiate tube feeds via Cortrak: - Start Osmolite 1.5 @ 20 ml/hr and increase by 10 ml every 8 hours to goal rate of 60 ml/hr (1440 ml/day) - 45 ml ProSource TF TID  Tube feeding regimen at goal provides 2280 kcal, 123 grams of protein, and 1094 ml of H2O.  Monitor magnesium and phosphorus every 12 hours x 4 occurances, MD to replete as needed, as pt is at risk for refeeding syndrome given severe malnutrition.  - MVI with minerals daily  - When diet advanced, resume Boost Breeze po TID, each supplement provides 250 kcal and 9 grams of protein  NUTRITION DIAGNOSIS:   Severe Malnutrition related to decreased appetite,acute illness (sore tongue) as evidenced by moderate muscle depletion,moderate fat depletion.  Ongoing, being addressed via TF  GOAL:   Patient will meet greater than or equal to 90% of their needs  Will be met via TF at goal  MONITOR:   PO intake,Supplement acceptance,TF tolerance  REASON FOR ASSESSMENT:   Consult Assessment of nutrition requirement/status  ASSESSMENT:   Pt with PMH of CKD stage III, and HTN admitted 4/15 with mixed cardiogenic and septic shock and multisystem organ failure in setting of PNA.  4/18 - NG tube removed, started on clear liquids 4/20 - s/p quadriceps muscle biopsy in OR, Cortrak placed (tip gastric)  HF team is following pt (ECHO EF: 15%). Pt remains on dobutamine. Per notes, pt with very atypical presentation for advanced HF and concern is for a more systemic auto-immune/inflammatory versus malignancy.  Pt previously on clear liquid diet but NPO last night at midnight for OR today. Meal completions were 100% x 3 meals while on clear liquid diet.  Cortrak placed today, tip gastric per Cortrak team. Received consult for tube feeding initiation and management. Discussed plan with  RN.  Admit weight: 82.7 kg Current weight: 77.4 kg Lowest weight: 75.7 kg  Pt continues to diurese with IV lasix. Weight down a total of 5.3 kg and pt is net negative 10.7 L since admission. Pt continues to have deep pitting edema to BLE per nursing documentation.  Medications reviewed and include: Boost Breeze TID, diflucan, lasix, magic mouthwash, MVI with minerals, protonix, thiamine, IV abx, dobutamine, heparin  Labs reviewed: sodium 146, BUN 80, creatinine 1.77, elevated LFTs, hemoglobin 7.9 CBG's: 114  UOP: 3535 ml x 24 hours I/O's: -10.7 L since admit  Diet Order:   Diet Order            Diet NPO time specified  Diet effective midnight                 EDUCATION NEEDS:   No education needs have been identified at this time  Skin:  Skin Assessment: Skin Integrity Issues: Incisions: L thigh s/p biopsy  Last BM:  09/14/20  Height:   Ht Readings from Last 1 Encounters:  09/14/20 6' (1.829 m)    Weight:   Wt Readings from Last 1 Encounters:  09/17/2020 77.4 kg    Ideal Body Weight:  80.9 kg  BMI:  Body mass index is 23.14 kg/m.  Estimated Nutritional Needs:   Kcal:  2200-2400  Protein:  115-135 grams  Fluid:  2 L/day    Kate Holmes, MS, RD, LDN Inpatient Clinical Dietitian Please see AMiON for contact information.  

## 2020-09-18 NOTE — Progress Notes (Signed)
Biopsy complete. Can restart heparin at 10:00 today. Diet per primary team

## 2020-09-18 NOTE — Progress Notes (Signed)
ANTICOAGULATION CONSULT NOTE   Pharmacy Consult for Heparin Indication: LV mural thrombus  Labs: Recent Labs    09/16/20 0501 09/16/20 1240 09/17/20 0500 09/17/20 0504 09/17/20 0804 09/17/20 1720 09/17/20 2056 09/15/2020 0210  HGB 9.4*   < > 8.7*  --   --  8.0*  --  7.9*  HCT 27.7*   < > 25.6*  --   --  23.7*  --  24.6*  PLT 96*   < > 69*  --   --  71*  --  73*  LABPROT  --   --   --   --  17.1*  --   --   --   INR  --   --   --   --  1.4*  --   --   --   HEPARINUNFRC 0.36  --  <0.10*  --   --   --  <0.10* <0.10*  CREATININE 1.88*  --  1.97*  --   --   --   --  1.77*  CKTOTAL  --   --   --  3,219*  --   --   --   --    < > = values in this interval not displayed.   Assessment: 55 y.o. M presents with AMS. Pharmacy consulted for heparin for NSTEMI. No AC PTA. H/H ok on admission, plt low at 121.  IV heparin has been held since 4/18 at 1500 due to concern for bleeding. No obvious source of bleeding identified, CT negative for bleeding. Hgb low but stable in 8s, plt have trended down to 70s. Fibrinogen 408, LDH elevated at 630.   Continued to hold heparin on 4/19 d/t planned procedure for 4/20 in AM. Patient now s/p muscle biopsy. No reported s/sx bleeding per RN who received report from PACU. Per Dr. Sheliah Hatch okay to resume heparin at 1000 today.  Goal of Therapy:  Heparin level 0.3-0.7 units/ml Monitor platelets by anticoagulation protocol: Yes   Plan:  Restart heparin at 900 units/hr Heparin level in 6 hours Monitor daily HL, CBC/plt Monitor for signs/symptoms of bleeding   Laverna Peace, PharmD PGY-1 Pharmacy Resident 09/07/2020 10:06 AM Please see AMION for all pharmacy numbers

## 2020-09-18 NOTE — Progress Notes (Signed)
Pre Procedure note for inpatients:   Dennis Howard. has been scheduled for Procedure(s) with comments: QUADRICEPS MUSCLE BIOPSY (N/A) - 60 MINUTES ROOM 1 today. The various methods of treatment have been discussed with the patient. After consideration of the risks, benefits and treatment options the patient has consented to the planned procedure.   The patient has been seen and labs reviewed. There are no changes in the patient's condition to prevent proceeding with the planned procedure today.  Recent labs:  Lab Results  Component Value Date   WBC 35.2 (H) 09/15/2020   HGB 7.9 (L) 09/20/2020   HCT 24.6 (L) 09/07/2020   PLT 73 (L) 08/30/2020   GLUCOSE 127 (H) 08/30/2020   ALT 355 (H) 09/02/2020   AST 177 (H) 09/28/2020   NA 146 (H) 09/17/2020   K 4.1 09/19/2020   CL 108 09/11/2020   CREATININE 1.77 (H) 09/03/2020   BUN 80 (H) 09/10/2020   CO2 30 09/21/2020   TSH 2.611 09/21/2020   INR 1.4 (H) 09/17/2020    Rodman Pickle, MD 09/06/2020 8:06 AM

## 2020-09-18 NOTE — Progress Notes (Signed)
ANTICOAGULATION CONSULT NOTE - Follow Up Consult  Pharmacy Consult for IV Heparin Indication: LV mural thrombus  Heparin Dosing Weight:  77.4 kg  Labs: Recent Labs    09/16/20 0501 09/16/20 1240 09/17/20 0500 09/17/20 0504 09/17/20 0804 09/17/20 1720 09/17/20 2056 16-Oct-2020 0210 Oct 16, 2020 1709  HGB 9.4*   < > 8.7*  --  8.5* 8.0*  --  7.9*  --   HCT 27.7*   < > 25.6*  --   --  23.7*  --  24.6*  --   PLT 96*   < > 69*  --   --  71*  --  73*  --   LABPROT  --   --   --   --  17.1*  --   --   --   --   INR  --   --   --   --  1.4*  --   --   --   --   HEPARINUNFRC 0.36  --  <0.10*  --   --   --  <0.10* <0.10* 0.15*  CREATININE 1.88*  --  1.97*  --   --   --   --  1.77*  --   CKTOTAL  --   --   --  3,219*  --   --   --   --   --    < > = values in this interval not displayed.   Assessment: 55 yr old man presented with AMS. Pharmacy was consulted for heparin for NSTEMI; pt was on no anticoagulants PTA. H/H ok on admission, plt low at 121.  IV heparin was held since 4/18 at 1500, due to concern for bleeding. No obvious source of bleeding identified, CT negative for bleeding.   Continued to hold heparin on 4/19, due to planned procedure for 4/20 AM; patient now S/P muscle biopsy. No reported bleeding, per RN who received report from PACU. Per Dr. Sheliah Hatch, okay to resume heparin at 1000 today.  Initial heparin level ~7 hrs after heparin infusion was restarted at 900 units/hr was 0.15 units/ml, which is below the goal range for this pt. H/H 7.9/24.6, plt 73. Per RN, no issues with IV or bleeding observed.  Goal of Therapy:  Heparin level 0.3-0.7 units/ml Monitor platelets by anticoagulation protocol: Yes   Plan:  Increase heparin infusion to 1100 units/hr Check 6-hr heparin level Monitor daily heparin level, CBC, platelets Monitor for signs/symptoms of bleeding   Vicki Mallet, PharmD, BCPS, [facility] Clinical Pharmacist 10-16-2020 6:40 PM

## 2020-09-18 NOTE — Progress Notes (Signed)
Pt has returned to 3M11

## 2020-09-18 NOTE — Transfer of Care (Signed)
Immediate Anesthesia Transfer of Care Note  Patient: Dennis Howard.  Procedure(s) Performed: QUADRICEPS MUSCLE BIOPSY (Left Thigh)  Patient Location: PACU  Anesthesia Type:MAC  Level of Consciousness: awake, alert  and oriented  Airway & Oxygen Therapy: Patient Spontanous Breathing and Patient connected to face mask oxygen  Post-op Assessment: Report given to RN and Post -op Vital signs reviewed and stable  Post vital signs: Reviewed and stable  Last Vitals:  Vitals Value Taken Time  BP 121/85 09/03/2020 0905  Temp    Pulse 106   Resp 16 09/22/2020 0908  SpO2 99   Vitals shown include unvalidated device data.  Last Pain:  Vitals:   09/06/2020 0400  TempSrc: Oral  PainSc: Asleep         Complications: No complications documented.

## 2020-09-18 NOTE — Progress Notes (Signed)
Pt transported to MRI 

## 2020-09-18 NOTE — Progress Notes (Signed)
Pt transported to short stay bed 34 without incident.

## 2020-09-18 NOTE — Progress Notes (Addendum)
Advanced Heart Failure Rounding Note  PCP-Cardiologist: No primary care provider on file.    Patient Profile   55 y/o male w/ no prior cardiac history, admitted for mixed cardiogenic + septic shock in setting of PNA + hepatic encephalopathy.   ECHO LVEF 15% with severe global HK with regional variability + LV clot RV moderately down. Initial Co-ox 48%. Started on DBA.    Subjective:    DBA increased yesterday to 7.5 given persistently low Co-ox, improved today 48>>79%.   Lactic acid trending down, 2.9>>2.2  SCr/ BUN down-trending 1.97>>1.77/ 98>>80   Good UOP -3.5L out yesterday, 1.2L out thus far today. C/w fluid overload. CVP not working currently. RN working to [employer] for autoimmune/ inflammatory process. CRP and ESR both elevated, 5.7 and 17 respectively. Muscle biopsy completed, left quad. Pathology pending. Getting brain MRI today and starting steroids.   WBC 34>>35K Hgb 8.0>>7.9 Plt 71>>73K   Hematology following.   Poor PO intake. CorTrak placed today. Getting TFs    Objective:   Weight Range: 77.4 kg Body mass index is 23.14 kg/m.   Vital Signs:   Temp:  [97.1 F (36.2 C)-98.6 F (37 C)] 98.2 F (36.8 C) (04/20 1153) Pulse Rate:  [105-160] 109 (04/20 1200) Resp:  [5-24] 11 (04/20 1200) BP: (107-129)/(75-109) 122/86 (04/20 1200) SpO2:  [62 %-100 %] 99 % (04/20 1200) Weight:  [77.4 kg] 77.4 kg (04/20 0144) Last BM Date: 09/14/20 (per chart)  Weight change: Filed Weights   09/16/20 0500 09/17/20 0500 09/16/2020 0144  Weight: 75.7 kg 76 kg 77.4 kg    Intake/Output:   Intake/Output Summary (Last 24 hours) at 09/06/2020 1301 Last data filed at 09/04/2020 1200 Gross per 24 hour  Intake 921.39 ml  Output 3610 ml  Net -2688.61 ml      Physical Exam    CVP currently not working   General:  fatigue appearing. No resp difficulty HEENT: Normal + Cortrak  Neck: Supple. JVD 10 cm + CVC Rt IJ . Carotids 2+ bilat; no bruits. No  lymphadenopathy or thyromegaly appreciated. Cor: PMI nondisplaced. RRR No rubs, gallops or murmurs. Lungs: Clear Abdomen: Soft, nontender, nondistended. No hepatosplenomegaly. No bruits or masses. Good bowel sounds. Extremities: No cyanosis, clubbing, rash, 2+ bilateral edema up to tthighs bilateral unna boots Neuro: Alert & orientedx3, cranial nerves grossly intact. moves all 4 extremities w/o difficulty. Affect pleasant   Telemetry   Sinus tach, low 100s  EKG    No new ekg to review  Labs    CBC Recent Labs    09/16/20 1738 09/16/20 2106 09/17/20 1720 09/17/2020 0210  WBC 29.8*   < > 34.0* 35.2*  NEUTROABS 25.8*  --   --   --   HGB 8.4*   < > 8.0* 7.9*  HCT 25.6*   < > 23.7* 24.6*  MCV 90.1   < > 89.4 91.8  PLT 72*   < > 71* 73*   < > = values in this interval not displayed.   Basic Metabolic Panel Recent Labs    09/17/20 0500 09/24/2020 0210  NA 145 146*  K 4.5 4.1  CL 110 108  CO2 28 30  GLUCOSE 140* 127*  BUN 98* 80*  CREATININE 1.97* 1.77*  CALCIUM 7.5* 7.6*   Liver Function Tests Recent Labs    09/17/20 0500 09/04/2020 0210  AST 189* 177*  ALT 401* 355*  ALKPHOS 348* 309*  BILITOT 3.3* 3.1*  PROT 4.2* 4.6*  ALBUMIN 1.6* 1.8*   No results for input(s): LIPASE, AMYLASE in the last 72 hours. Cardiac Enzymes Recent Labs    09/17/20 0504  CKTOTAL 3,219*    BNP: BNP (last 3 results) Recent Labs    09/05/2020 2114 09/14/20 0031 09/15/20 0321  BNP 1,447.9* 1,523.7* 743.8*    ProBNP (last 3 results) No results for input(s): PROBNP in the last 8760 hours.   D-Dimer No results for input(s): DDIMER in the last 72 hours. Hemoglobin A1C No results for input(s): HGBA1C in the last 72 hours. Fasting Lipid Panel No results for input(s): CHOL, HDL, LDLCALC, TRIG, CHOLHDL, LDLDIRECT in the last 72 hours. Thyroid Function Tests No results for input(s): TSH, T4TOTAL, T3FREE, THYROIDAB in the last 72 hours.  Invalid input(s): FREET3  Other  results:   Imaging    No results found.   Medications:     Scheduled Medications: . Chlorhexidine Gluconate Cloth  6 each Topical Daily  . feeding supplement  1 Container Oral TID BM  . feeding supplement (PROSource TF)  45 mL Per Tube TID  . fluconazole  50 mg Oral Daily  . furosemide  40 mg Intravenous BID  . magic mouthwash  10 mL Oral TID  . multivitamin with minerals  1 tablet Oral Daily  . pantoprazole (PROTONIX) IV  40 mg Intravenous Q12H  . sodium chloride flush  10-40 mL Intracatheter Q12H  . thiamine injection  100 mg Intravenous Daily    Infusions: .  ceFAZolin (ANCEF) IV    . ceFEPime (MAXIPIME) IV Stopped (09/24/2020 0540)  . DOBUTamine 7.5 mcg/kg/min (09/12/2020 1200)  . feeding supplement (OSMOLITE 1.5 CAL)    . heparin 900 Units/hr (09/03/2020 1200)    PRN Medications: docusate, oxyCODONE, sodium chloride flush    Patient Profile   55 y/o male w/ no prior cardiac history, admitted for mixed cardiogenic + septic shock in setting of PNA + hepatic encephalopathy.   ECHO LVEF 15% with severe global HK with regional variability + LV clot RV moderately down. Initial Co-ox 48%. Started on DBA.    Assessment/Plan   1. Cardiogenic shock with multisystem organ failure - ECHO LVEF 15% with severe global HK with regional variability + LV clot RV moderately down. - suspect this is primary issue with MSOF as a result of low output stated and decreased mental status - on DBA 7.5 mcg/kg/min. Co-ox 79%, lactic acid down trending  - has diffuse LE edema but CVP low. Suspect 3rd spacing in setting of low albumin. TSH ok. Urine negative for protein  - C/w gentle diuresis and Unna boots  - will need R/L cath as he recovers  2. LV thrombus - c/w heparin gtt  3. Shock liver with hepatic encephalopathy - due to low output HF. Transaminases improving with hemodynamics support - Liver u/s suggestive of hepatic steatosis but not long-term cirrhosis. Though low albumin and  low PLTs suggestive of more longstanding liver process  - continue supportive care  4. AKI due to ATN/shock - Baseline creatinine unknown  - SCr 1.5 in 2016 in Care Everywhere - Creatinine 2.1 in 3/22 -> 1.99->1.88->1.97->1.77 today  5. CAP - PCT 3.70 -> 2.9 - BC NGTD  - was on cefepime, now on Ancef   6. Rhabdomyolysis - improving, CK down trending  - muscle biopsy pending   7. Hyperkalemia - resolved   8. Acute Anemia - Unexplained drop in Hgb from 15 on admit>> 8.4.  - CT of A/P negative for RPB  -  transfused 1 unit 4/18, Hgb 8.4>>8.7>8.0>>7.9 today  - WBC 35K, Plt 73K  - Associated leukocytosis + thrombocytopenia raises concern for acute hematologic process ? Leukemia. Blood smear pending. CT of chest w/o signs of malignancy  - hematology following, Concern for autoimmune/ inflammatory process. See discussion above   Length of Stay: [address] PA-C  09/24/2020, 1:01 PM  Advanced Heart Failure Team Pager [phone] (M-F; 7a - 5p)  Please contact [facility] for night-coverage after hours (5p -7a ) and weekends on [email or web address]  Agree with above.   On DBA at 7.5. Co-ox improved. Renal function improved.  Remains very weak, Had muscle biopsy this am.   ANCA serologies (and others) are negative. Several small infarcts on brain MRI. Chest CT with multifocal PNA.   General:  Weak appearing. Lethargic, No resp difficulty HEENT: normal Neck: supple. no JVD. Carotids 2+ bilat; no bruits. No lymphadenopathy or thryomegaly appreciated. Cor: PMI nondisplaced. Regular rate & rhythm. No rubs, gallops or murmurs. Lungs: clear Abdomen: soft, nontender, nondistended. No hepatosplenomegaly. No bruits or masses. Good bowel sounds. Extremities: no cyanosis, clubbing, rash, 1-2+ edema + UNNA Neuro: lethargic but will arouse , cranial nerves grossly intact. moves all 4 extremities w/o difficulty. Affect pleasant  He remains quite ill but unifying diagnosis remains  unclear. Hemodynamics improved on higher dose DBA. Continues to diurese well.   D/w Dr. 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   Continue heparin for LV thrombus with several small cerebral emboli.   Await results of muscle biopsy.   CRITICAL CARE Performed by: Glori Bickers  Total critical care time: 35 minutes  Critical care time was exclusive of separately billable procedures and treating other patients.  Critical care was necessary to treat or prevent imminent or life-threatening deterioration.  Critical care was time spent personally by me (independent of midlevel providers or residents) on the following activities: development of treatment plan with patient and/or surrogate as well as nursing, discussions with consultants, evaluation of patient's response to treatment, examination of patient, obtaining history from patient or surrogate, ordering and performing treatments and interventions, ordering and review of laboratory studies, ordering and review of radiographic studies, pulse oximetry and re-evaluation of patient's condition.  Glori Bickers, MD  10:13 PM

## 2020-09-18 NOTE — Progress Notes (Signed)
Report given to short stay RN - Thayer Ohm

## 2020-09-18 NOTE — Progress Notes (Addendum)
OT Cancellation Note  Patient Details Name: Dennis Howard. MRN: [id number] DOB: 20-Jan-1966   Cancelled Treatment:    Reason Eval/Treat Not Completed: Patient at procedure or test/ unavailable (Pt at MRI.) OT to follow for OT eval.  Pt rechecked for OT eval at 3pm and pt very drowsy and in pain so OT to return tomorrow to attempt OT eval.   Flora Lipps, OTR/L Acute Rehabilitation Services Pager: [phone] Office: [phone]   Aksh Swart C 09/11/2020, 12:44 PM

## 2020-09-18 NOTE — Anesthesia Postprocedure Evaluation (Signed)
Anesthesia Post Note  Patient: Dennis Howard.  Procedure(s) Performed: QUADRICEPS MUSCLE BIOPSY (Left Thigh)     Patient location during evaluation: PACU Anesthesia Type: MAC Level of consciousness: awake and alert Pain management: pain level controlled Vital Signs Assessment: post-procedure vital signs reviewed and stable Respiratory status: spontaneous breathing, nonlabored ventilation, respiratory function stable and patient connected to nasal cannula oxygen Cardiovascular status: stable and blood pressure returned to baseline Postop Assessment: no apparent nausea or vomiting Anesthetic complications: no   No complications documented.  Last Vitals:  Vitals:   09/21/2020 1153 09/17/2020 1200  BP:  122/86  Pulse:  (!) 109  Resp:  11  Temp: 36.8 C   SpO2:  99%    Last Pain:  Vitals:   09/11/2020 1153  TempSrc: Axillary  PainSc:                  March Rummage Francheska Villeda

## 2020-09-18 NOTE — Progress Notes (Signed)
Pharmacy Antibiotic Note  Dennis Howard. is a 55 y.o. male admitted on 09/16/2020 with aspiration pneumonia and sepsis. Pharmacy was consulted for cefepime dosing. WBC elevated at 35.2, likely related to additional etiologies, ongoing serologic work up and muscle biopsy obtained today.  Afebrile. Most recent CXR 4/19 consistent with multifocal pneumonia.  Plan: Continue cefepime 2g Q 12 hr Stop date entered for total of 7 days of cefepime per MD  Height: 6' (182.9 cm) Weight: 77.4 kg (170 lb 10.2 oz) IBW/kg (Calculated) : 77.6  Temp (24hrs), Avg:97.9 F (36.6 C), Min:97.1 F (36.2 C), Max:98.6 F (37 C)  Recent Labs  Lab 09/05/2020 1802 09/08/2020 2114 09/14/20 0031 09/14/20 0940 09/15/20 0321 09/16/20 0501 09/16/20 1240 09/16/20 1738 09/17/20 0500 09/17/20 1239 09/17/20 1720 09/08/2020 0210  WBC  --   --    < > 25.3* 25.0* 28.1* 31.0* 29.8* 35.1*  --  34.0* 35.2*  CREATININE  --   --    < > 2.07* 1.99* 1.88*  --   --  1.97*  --   --  1.77*  LATICACIDVEN 2.2* 2.7*  --   --   --   --   --   --   --  2.9* 2.2*  --    < > = values in this interval not displayed.    Estimated Creatinine Clearance: 52.8 mL/min (A) (by C-G formula based on SCr of 1.77 mg/dL (H)).    No Known Allergies  Antimicrobials this admission: 4/15 CTX x1 4/15 Azith >> 4/15 Flagyl x1 4/15 Unasyn >>4/17 4/17 Cefepime >> (4/24)  Microbiology results: 4/15 Strep Pneumo - neg 4/15 MRSA pcr -neg 4/15 BCx >>ngtd 4/16 BCx >>ngtd  Laverna Peace, PharmD PGY-1 Pharmacy Resident 09/17/2020 2:21 PM Please see AMION for all pharmacy numbers

## 2020-09-19 ENCOUNTER — Encounter (HOSPITAL_COMMUNITY): Payer: Self-pay | Admitting: General Surgery

## 2020-09-19 ENCOUNTER — Inpatient Hospital Stay (HOSPITAL_COMMUNITY): Payer: BLUE CROSS/BLUE SHIELD

## 2020-09-19 DIAGNOSIS — I639 Cerebral infarction, unspecified: Secondary | ICD-10-CM

## 2020-09-19 DIAGNOSIS — R918 Other nonspecific abnormal finding of lung field: Secondary | ICD-10-CM | POA: Diagnosis not present

## 2020-09-19 DIAGNOSIS — D649 Anemia, unspecified: Secondary | ICD-10-CM | POA: Diagnosis not present

## 2020-09-19 DIAGNOSIS — N189 Chronic kidney disease, unspecified: Secondary | ICD-10-CM | POA: Diagnosis not present

## 2020-09-19 DIAGNOSIS — I5021 Acute systolic (congestive) heart failure: Secondary | ICD-10-CM | POA: Diagnosis not present

## 2020-09-19 DIAGNOSIS — D696 Thrombocytopenia, unspecified: Secondary | ICD-10-CM | POA: Diagnosis not present

## 2020-09-19 LAB — COMPREHENSIVE METABOLIC PANEL
ALT: 290 U/L — ABNORMAL HIGH (ref 0–44)
ALT: 276 U/L — ABNORMAL HIGH (ref 0–44)
AST: 182 U/L — ABNORMAL HIGH (ref 15–41)
AST: 230 U/L — ABNORMAL HIGH (ref 15–41)
Albumin: 1.7 g/dL — ABNORMAL LOW (ref 3.5–5.0)
Albumin: 1.3 g/dL — ABNORMAL LOW (ref 3.5–5.0)
Alkaline Phosphatase: 285 U/L — ABNORMAL HIGH (ref 38–126)
Alkaline Phosphatase: 211 U/L — ABNORMAL HIGH (ref 38–126)
Anion gap: 6 (ref 5–15)
Anion gap: 12 (ref 5–15)
BUN: 68 mg/dL — ABNORMAL HIGH (ref 6–20)
BUN: 83 mg/dL — ABNORMAL HIGH (ref 6–20)
CO2: 31 mmol/L (ref 22–32)
CO2: 24 mmol/L (ref 22–32)
Calcium: 7.7 mg/dL — ABNORMAL LOW (ref 8.9–10.3)
Calcium: 7.3 mg/dL — ABNORMAL LOW (ref 8.9–10.3)
Chloride: 113 mmol/L — ABNORMAL HIGH (ref 98–111)
Chloride: 113 mmol/L — ABNORMAL HIGH (ref 98–111)
Creatinine, Ser: 1.63 mg/dL — ABNORMAL HIGH (ref 0.61–1.24)
Creatinine, Ser: 2.04 mg/dL — ABNORMAL HIGH (ref 0.61–1.24)
GFR, Estimated: 50 mL/min — ABNORMAL LOW (ref >60)
GFR, Estimated: 38 mL/min — ABNORMAL LOW (ref >60)
Glucose, Bld: 158 mg/dL — ABNORMAL HIGH (ref 70–99)
Glucose, Bld: 332 mg/dL — ABNORMAL HIGH (ref 70–99)
Potassium: 3.8 mmol/L (ref 3.5–5.1)
Potassium: 3.3 mmol/L — ABNORMAL LOW (ref 3.5–5.1)
Sodium: 150 mmol/L — ABNORMAL HIGH (ref 135–145)
Sodium: 149 mmol/L — ABNORMAL HIGH (ref 135–145)
Total Bilirubin: 2.2 mg/dL — ABNORMAL HIGH (ref 0.3–1.2)
Total Bilirubin: 1.9 mg/dL — ABNORMAL HIGH (ref 0.3–1.2)
Total Protein: 4.9 g/dL — ABNORMAL LOW (ref 6.5–8.1)
Total Protein: 4 g/dL — ABNORMAL LOW (ref 6.5–8.1)

## 2020-09-19 LAB — CBC
HCT: 23.8 % — ABNORMAL LOW (ref 39.0–52.0)
HCT: 17.3 % — ABNORMAL LOW (ref 39.0–52.0)
Hemoglobin: 7.5 g/dL — ABNORMAL LOW (ref 13.0–17.0)
Hemoglobin: 5.3 g/dL — CL (ref 13.0–17.0)
MCH: 29.8 pg (ref 26.0–34.0)
MCH: 30.1 pg (ref 26.0–34.0)
MCHC: 31.5 g/dL (ref 30.0–36.0)
MCHC: 30.6 g/dL (ref 30.0–36.0)
MCV: 94.4 fL (ref 80.0–100.0)
MCV: 98.3 fL (ref 80.0–100.0)
Platelets: 85 10*3/uL — ABNORMAL LOW (ref 150–400)
Platelets: 74 10*3/uL — ABNORMAL LOW (ref 150–400)
RBC: 2.52 MIL/uL — ABNORMAL LOW (ref 4.22–5.81)
RBC: 1.76 MIL/uL — ABNORMAL LOW (ref 4.22–5.81)
RDW: 20.4 % — ABNORMAL HIGH (ref 11.5–15.5)
RDW: 20.9 % — ABNORMAL HIGH (ref 11.5–15.5)
WBC: 30.6 10*3/uL — ABNORMAL HIGH (ref 4.0–10.5)
WBC: 31.6 10*3/uL — ABNORMAL HIGH (ref 4.0–10.5)
nRBC: 0.5 % — ABNORMAL HIGH (ref 0.0–0.2)
nRBC: 1.3 % — ABNORMAL HIGH (ref 0.0–0.2)

## 2020-09-19 LAB — RPR: RPR Ser Ql: NONREACTIVE

## 2020-09-19 LAB — COOXEMETRY PANEL
Carboxyhemoglobin: 2.4 % — ABNORMAL HIGH (ref 0.5–1.5)
Methemoglobin: 0.9 % (ref 0.0–1.5)
O2 Saturation: 60.3 %
Total hemoglobin: 7.8 g/dL — ABNORMAL LOW (ref 12.0–16.0)

## 2020-09-19 LAB — CULTURE, BLOOD (ROUTINE X 2)
Culture: NO GROWTH
Culture: NO GROWTH
Special Requests: ADEQUATE
Special Requests: ADEQUATE

## 2020-09-19 LAB — MPO/PR-3 (ANCA) ANTIBODIES
ANCA Proteinase 3: <3.5 U/mL (ref 0.0–3.5)
Myeloperoxidase Abs: <9 U/mL (ref 0.0–9.0)

## 2020-09-19 LAB — GLUCOSE, CAPILLARY
Glucose-Capillary: 154 mg/dL — ABNORMAL HIGH (ref 70–99)
Glucose-Capillary: 160 mg/dL — ABNORMAL HIGH (ref 70–99)
Glucose-Capillary: 220 mg/dL — ABNORMAL HIGH (ref 70–99)
Glucose-Capillary: 233 mg/dL — ABNORMAL HIGH (ref 70–99)
Glucose-Capillary: 241 mg/dL — ABNORMAL HIGH (ref 70–99)
Glucose-Capillary: 242 mg/dL — ABNORMAL HIGH (ref 70–99)
Glucose-Capillary: 341 mg/dL — ABNORMAL HIGH (ref 70–99)

## 2020-09-19 LAB — HEMOGLOBIN A1C
Hgb A1c MFr Bld: 5.7 % — ABNORMAL HIGH (ref 4.8–5.6)
Mean Plasma Glucose: 116.89 mg/dL

## 2020-09-19 LAB — PREPARE RBC (CROSSMATCH)

## 2020-09-19 LAB — PHOSPHORUS
Phosphorus: 3.8 mg/dL (ref 2.5–4.6)
Phosphorus: 3.2 mg/dL (ref 2.5–4.6)

## 2020-09-19 LAB — HEPARIN LEVEL (UNFRACTIONATED)
Heparin Unfractionated: 0.41 IU/mL (ref 0.30–0.70)
Heparin Unfractionated: 0.51 IU/mL (ref 0.30–0.70)

## 2020-09-19 LAB — MAGNESIUM
Magnesium: 2.6 mg/dL — ABNORMAL HIGH (ref 1.7–2.4)
Magnesium: 2.7 mg/dL — ABNORMAL HIGH (ref 1.7–2.4)

## 2020-09-19 LAB — LACTATE DEHYDROGENASE: LDH: 608 U/L — ABNORMAL HIGH (ref 98–192)

## 2020-09-19 MED ORDER — METOLAZONE 5 MG PO TABS: 5.0000 mg | ORAL_TABLET | Freq: Every day | ORAL | Status: DC

## 2020-09-19 MED ORDER — INSULIN ASPART 100 UNIT/ML ~~LOC~~ SOLN
0.0000 [IU] | SUBCUTANEOUS | Status: DC
  Administered 2020-09-19: 7 [IU] via SUBCUTANEOUS
  Administered 2020-09-19: 15 [IU] via SUBCUTANEOUS
  Administered 2020-09-20: 11 [IU] via SUBCUTANEOUS
  Administered 2020-09-20: 4 [IU] via SUBCUTANEOUS
  Administered 2020-09-20 (×3): 7 [IU] via SUBCUTANEOUS
  Administered 2020-09-20: 4 [IU] via SUBCUTANEOUS
  Administered 2020-09-21 (×2): 11 [IU] via SUBCUTANEOUS
  Administered 2020-09-21 (×2): 4 [IU] via SUBCUTANEOUS
  Administered 2020-09-21: 7 [IU] via SUBCUTANEOUS
  Administered 2020-09-21: 11 [IU] via SUBCUTANEOUS
  Administered 2020-09-22 (×2): 7 [IU] via SUBCUTANEOUS
  Administered 2020-09-22: 11 [IU] via SUBCUTANEOUS
  Administered 2020-09-22 (×2): 7 [IU] via SUBCUTANEOUS
  Administered 2020-09-22: 11 [IU] via SUBCUTANEOUS
  Administered 2020-09-23: 7 [IU] via SUBCUTANEOUS
  Administered 2020-09-23 (×2): 4 [IU] via SUBCUTANEOUS
  Administered 2020-09-23: 7 [IU] via SUBCUTANEOUS
  Administered 2020-09-23: 3 [IU] via SUBCUTANEOUS
  Administered 2020-09-23: 4 [IU] via SUBCUTANEOUS
  Administered 2020-09-24 (×3): 3 [IU] via SUBCUTANEOUS
  Administered 2020-09-24: 4 [IU] via SUBCUTANEOUS
  Administered 2020-09-24: 3 [IU] via SUBCUTANEOUS
  Administered 2020-09-24: 4 [IU] via SUBCUTANEOUS
  Administered 2020-09-25: 3 [IU] via SUBCUTANEOUS
  Administered 2020-09-25: 4 [IU] via SUBCUTANEOUS
  Administered 2020-09-25 (×2): 3 [IU] via SUBCUTANEOUS
  Administered 2020-09-25 – 2020-09-26 (×2): 4 [IU] via SUBCUTANEOUS
  Administered 2020-09-26 (×2): 3 [IU] via SUBCUTANEOUS
  Administered 2020-09-26: 4 [IU] via SUBCUTANEOUS
  Administered 2020-09-26: 3 [IU] via SUBCUTANEOUS
  Administered 2020-09-26 – 2020-09-27 (×3): 4 [IU] via SUBCUTANEOUS
  Administered 2020-09-27: 3 [IU] via SUBCUTANEOUS
  Administered 2020-09-27 (×2): 7 [IU] via SUBCUTANEOUS
  Administered 2020-09-27: 3 [IU] via SUBCUTANEOUS
  Administered 2020-09-28: 4 [IU] via SUBCUTANEOUS
  Administered 2020-09-28: 3 [IU] via SUBCUTANEOUS
  Administered 2020-09-28: 4 [IU] via SUBCUTANEOUS
  Administered 2020-09-28 (×2): 3 [IU] via SUBCUTANEOUS
  Administered 2020-09-28: 4 [IU] via SUBCUTANEOUS
  Administered 2020-09-29: 3 [IU] via SUBCUTANEOUS
  Administered 2020-09-29 (×3): 4 [IU] via SUBCUTANEOUS
  Administered 2020-09-29: 3 [IU] via SUBCUTANEOUS
  Administered 2020-09-29: 4 [IU] via SUBCUTANEOUS
  Administered 2020-09-30: 3 [IU] via SUBCUTANEOUS
  Administered 2020-09-30 (×3): 4 [IU] via SUBCUTANEOUS
  Administered 2020-10-01: 3 [IU] via SUBCUTANEOUS
  Administered 2020-10-01: 4 [IU] via SUBCUTANEOUS
  Administered 2020-10-01: 3 [IU] via SUBCUTANEOUS

## 2020-09-19 MED ORDER — INSULIN ASPART 100 UNIT/ML ~~LOC~~ SOLN
10.0000 [IU] | Freq: Once | SUBCUTANEOUS | Status: AC
  Administered 2020-09-19: 10 [IU] via SUBCUTANEOUS

## 2020-09-19 MED ORDER — FUROSEMIDE 10 MG/ML IJ SOLN: 40.0000 mg | Freq: Every day | INTRAMUSCULAR | Status: DC

## 2020-09-19 MED ORDER — POTASSIUM CHLORIDE CRYS ER 20 MEQ PO TBCR
40.0000 meq | EXTENDED_RELEASE_TABLET | Freq: Once | ORAL | Status: AC
  Administered 2020-09-19: 40 meq via ORAL
  Filled 2020-09-19: qty 2

## 2020-09-19 MED ORDER — SODIUM CHLORIDE 0.9% IV SOLUTION: Freq: Once | INTRAVENOUS | Status: AC

## 2020-09-19 MED ORDER — DOBUTAMINE IN D5W 4-5 MG/ML-% IV SOLN
10.0000 ug/kg/min | INTRAVENOUS | Status: DC
  Administered 2020-09-19 – 2020-09-29 (×8): 6.5 ug/kg/min via INTRAVENOUS
  Administered 2020-09-30: 6 ug/kg/min via INTRAVENOUS
  Administered 2020-10-02: 10 ug/kg/min via INTRAVENOUS
  Filled 2020-09-19 (×11): qty 250

## 2020-09-19 MED ORDER — INSULIN ASPART 100 UNIT/ML ~~LOC~~ SOLN
4.0000 [IU] | SUBCUTANEOUS | Status: DC
  Administered 2020-09-19 – 2020-10-02 (×71): 4 [IU] via SUBCUTANEOUS

## 2020-09-19 NOTE — Progress Notes (Signed)
ANTICOAGULATION CONSULT NOTE - Follow Up Consult  Pharmacy Consult for IV Heparin Indication: LV mural thrombus  Heparin Dosing Weight:  77.4 kg  Labs: Recent Labs    09/16/20 0501 09/16/20 1240 09/17/20 0500 09/17/20 0504 09/17/20 0804 09/17/20 1720 09/17/20 2056 10/16/20 0210 10/16/20 1709 09/19/20 0118  HGB 9.4*   < > 8.7*  --  8.5* 8.0*  --  7.9*  --   --   HCT 27.7*   < > 25.6*  --   --  23.7*  --  24.6*  --   --   PLT 96*   < > 69*  --   --  71*  --  73*  --   --   LABPROT  --   --   --   --  17.1*  --   --   --   --   --   INR  --   --   --   --  1.4*  --   --   --   --   --   HEPARINUNFRC 0.36  --  <0.10*  --   --   --    < > <0.10* 0.15* 0.41  CREATININE 1.88*  --  1.97*  --   --   --   --  1.77*  --   --   CKTOTAL  --   --   --  3,219*  --   --   --   --   --   --    < > = values in this interval not displayed.   Assessment: 55 yr old man presented with AMS. Pharmacy was consulted for heparin for NSTEMI; pt was on no anticoagulants PTA. H/H ok on admission, plt low at 121.  IV heparin was held since 4/18 at 1500, due to concern for bleeding. No obvious source of bleeding identified, CT negative for bleeding.   Continued to hold heparin on 4/19, due to planned procedure for 4/20 AM; patient now S/P muscle biopsy. No reported bleeding, per RN who received report from PACU. Per Dr. Sheliah Hatch, okay to resume heparin at 1000 today.  Initial heparin level ~7 hrs after heparin infusion was restarted at 900 units/hr was 0.15 units/ml, which is below the goal range for this pt. H/H 7.9/24.6, plt 73. Per RN, no issues with IV or bleeding observed.  4/21 AM update:  Heparin level therapeutic after rate increase  Goal of Therapy:  Heparin level 0.3-0.7 units/ml Monitor platelets by anticoagulation protocol: Yes   Plan:  Cont heparin 1100 units/hr 1000 heparin level  Abran Duke, PharmD, BCPS Clinical Pharmacist Phone: [phone]

## 2020-09-19 NOTE — Progress Notes (Signed)
ANTICOAGULATION CONSULT NOTE - Follow Up Consult  Pharmacy Consult for IV Heparin Indication: LV mural thrombus  Heparin Dosing Weight:  77.4 kg  Labs: Recent Labs    09/17/20 0500 09/17/20 0504 09/17/20 0804 09/17/20 1720 09/17/20 2056 09/13/2020 0210 09/09/2020 1709 09/19/20 0118 09/19/20 0554 09/19/20 1031  HGB 8.7*  --  8.5* 8.0*  --  7.9*  --   --  7.5*  --   HCT 25.6*  --   --  23.7*  --  24.6*  --   --  23.8*  --   PLT 69*  --   --  71*  --  73*  --   --  85*  --   LABPROT  --   --  17.1*  --   --   --   --   --   --   --   INR  --   --  1.4*  --   --   --   --   --   --   --   HEPARINUNFRC <0.10*  --   --   --    < > <0.10* 0.15* 0.41  --  0.51  CREATININE 1.97*  --   --   --   --  1.77*  --   --  1.63*  --   CKTOTAL  --  3,219*  --   --   --   --   --   --   --   --    < > = values in this interval not displayed.   Assessment: 55 yr old man presented with AMS. Pharmacy was consulted for heparin for NSTEMI; pt was on no anticoagulants PTA. H/H ok on admission, plt low at 121.  Held heparin on 4/18, patient now S/P muscle biopsy. Per Dr. Sheliah Hatch, resumed heparin at 1000 4/20.  Confirmatory heparin level is 0.51, rate recently increased from 9000 to 1100 units/hr. Hgb low stable 7.5 today, platelets low stable at 85. MRI brain on 4/20 with multiple acute infarcts and subacute ischemic changes. Per Neurology, will target low end of goal range. Decrease rate slightly to reach goal.  Goal of Therapy:  Heparin level 0.3-0.5 units/ml  Monitor platelets by anticoagulation protocol: Yes   Plan:  Decrease infusion to 1050 units/hr Recheck heparin level with AM labs tomorrow Monitor daily heparin level, CBC, platelets Monitor for signs/symptoms of bleeding   Laverna Peace, PharmD PGY-1 Pharmacy Resident 09/19/2020 12:06 PM Please see AMION for all pharmacy numbers

## 2020-09-19 NOTE — Evaluation (Signed)
Clinical/Bedside Swallow Evaluation Patient Details  Name: Dennis Howard. MRN: [id number] Date of Birth: Jan 28, 1966  Today's Date: 09/19/2020 Time: SLP Start Time (ACUTE ONLY): 1309 SLP Stop Time (ACUTE ONLY): 1324 SLP Time Calculation (min) (ACUTE ONLY): 15 min  Past Medical History:  Past Medical History:  Diagnosis Date  . Bronchitis   . Environmental allergies   . Hypertension    Past Surgical History:  Past Surgical History:  Procedure Laterality Date  . MUSCLE BIOPSY Left 10/13/2020   Procedure: QUADRICEPS MUSCLE BIOPSY;  Surgeon: Sheliah Hatch, De Blanch, MD;  Location: [facility] OR;  Service: General;  Laterality: Left;  60 MINUTES ROOM 1   HPI:  55 yo man with a past medical history of chronic kidney disease, hypertension who presented to [facility] on 4/15 with CC AMS. MRI of brain showed 5 mm acute infarction at the inferior cerebellum on the left. 6 mm acute infarction at the left parietooccipital junction. Subacute ischemic changes of the cortical and subcortical brain at the left parietooccipital junction. Pt pending additional workup for neuromuscular disorder.   Assessment / Plan / Recommendation Clinical Impression  Pt presents with a suspected mild oropharyngeal dysphagia. Lateral lingual sores noted, pt being treated for thrush. He denies pain with PO consumption. Pt with recent and active PNA. Pt with delayed throat clear following consecutive swallows of thin liquids. Vocal quality remained clear. Pt with prolonged mastication with solid PO and mild oral residuals. Recommend continue dysphagia 3 (mechanical soft) and thin liquids with meds as tolerated. SLP to follow up for diet tolerance. Instrumental assessment may be indicated to objectively assess aspiration risk.   SLP Visit Diagnosis: Dysphagia, oral phase (R13.11);Dysphagia, unspecified (R13.10)    Aspiration Risk  Mild aspiration risk    Diet Recommendation   Dysphagia 3 (mechanical soft) thin liquids   Medication  Administration: Whole meds with liquid    Other  Recommendations Oral Care Recommendations: Oral care BID   Follow up Recommendations Other (comment) (pending PT/OT recs)      Frequency and Duration min 1 x/week  1 week       Prognosis Prognosis for Safe Diet Advancement: Good Barriers to Reach Goals: Time post onset      Swallow Study   General Date of Onset: 09/08/2020 HPI: 55 yo man with a past medical history of chronic kidney disease, hypertension who presented to [facility] on 4/15 with CC AMS. MRI of brain showed 5 mm acute infarction at the inferior cerebellum on the left. 6 mm acute infarction at the left parietooccipital junction. Subacute ischemic changes of the cortical and subcortical brain at the left parietooccipital junction. Pt pending additional workup for neuromuscular disorder. Type of Study: Bedside Swallow Evaluation Previous Swallow Assessment: none on file Diet Prior to this Study: Dysphagia 3 (soft);Thin liquids Temperature Spikes Noted: No Respiratory Status: Room air History of Recent Intubation: No Behavior/Cognition: Alert;Requires cueing Oral Cavity Assessment: Lesions Oral Care Completed by SLP: No Oral Cavity - Dentition: Adequate natural dentition Vision: Functional for self-feeding Self-Feeding Abilities: Needs assist Patient Positioning: Upright in bed Volitional Swallow: Able to elicit    Oral/Motor/Sensory Function Overall Oral Motor/Sensory Function: Generalized oral weakness   Ice Chips Ice chips: Within functional limits   Thin Liquid Thin Liquid: Impaired Presentation: Cup;Straw Pharyngeal  Phase Impairments: Suspected delayed Swallow;Multiple swallows;Throat Clearing - Delayed    Nectar Thick Nectar Thick Liquid: Not tested   Honey Thick Honey Thick Liquid: Not tested   Puree Puree: Within functional limits   Solid  Solid: Impaired Presentation: Self Fed Oral Phase Impairments: Reduced lingual movement/coordination;Impaired  mastication Oral Phase Functional Implications: Prolonged oral transit Pharyngeal Phase Impairments: Suspected delayed Swallow;Multiple swallows      Ardyth Gal MA, CCC-SLP Acute Rehabilitation Services   09/19/2020,1:49 PM

## 2020-09-19 NOTE — Progress Notes (Signed)
eLink Physician-Brief Progress Note Patient Name: Dennis Howard. DOB: 09-13-1965 MRN: [id number]   Date of Service  09/19/2020  HPI/Events of Note  Anemia - Hgb = 5.3.   eICU Interventions  Will transfuse 2 units PRBC.     Intervention Category Major Interventions: Other:  Lenell Antu 09/19/2020, 7:08 PM

## 2020-09-19 NOTE — Progress Notes (Addendum)
Advanced Heart Failure Rounding Note  PCP-Cardiologist: No primary care provider on file.    Patient Profile   55 y/o male w/ no prior cardiac history, admitted for mixed cardiogenic + septic shock in setting of PNA + hepatic encephalopathy.   ECHO LVEF 15% with severe global HK with regional variability + LV clot RV moderately down. Initial Co-ox 48%. Started on DBA.    Subjective:    DBA 7.5 CO-OX 60%.   Diuresing with IV lasix + metolazone. Negative 1 liter.   Sodium 150.    Lactic acid trending down, 2.9>>2.2  Creatinine trending down 2>1.8>1.6   Concern for autoimmune/ inflammatory process. CRP and ESR both elevated, 5.7 and 17 respectively. Muscle biopsy pending.    MRI Brain-1. 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 2. 6 mm acute infarction at the left parietooccipital junction. 3. Subacute ischemic changes of the cortical and subcortical brain at the left parietooccipital junction. 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 4. Flow does appear to be present in the vertebrobasilar system. Consider MR angiography the head and neck or CT angiography of the head and neck. 5. The brain appears otherwise normal without evidence other previous insult  WBC 34>>35>>30.6 K Hgb 8.0>>7.9>>7.5  Plt 71>>73>>85    Hematology following.   Complaining pain in his feet.    Objective:   Weight Range: 73.5 kg Body mass index is 21.98 kg/m.   Vital Signs:   Temp:  [97.1 F (36.2 C)-98.2 F (36.8 C)] 98.1 F (36.7 C) (04/21 0400) Pulse Rate:  [105-112] 108 (04/21 0630) Resp:  [0-18] 15 (04/21 0700) BP: (115-163)/(79-105) 133/86 (04/21 0700) SpO2:  [82 %-100 %] 99 % (04/21 0630) Weight:  [73.5 kg] 73.5 kg (04/21 0500) Last BM Date: 09/14/20  Weight change: Filed Weights   09/17/20 0500 09/24/2020 0144 09/19/20 0500  Weight: 76 kg 77.4 kg 73.5 kg    Intake/Output:   Intake/Output  Summary (Last 24 hours) at 09/19/2020 0830 Last data filed at 09/19/2020 0600 Gross per 24 hour  Intake 1280.15 ml  Output 2300 ml  Net -1019.85 ml      Physical Exam  CVP 1  General:  No resp difficulty.Appears weak  HEENT: Cortrak  Neck: supple. no JVD. Carotids 2+ bilat; no bruits. No lymphadenopathy or thryomegaly appreciated. RIJ  Cor: PMI nondisplaced. Tachy regular rate & rhythm. No rubs, gallops or murmurs. Lungs: clear Abdomen: soft, nontender, nondistended. No hepatosplenomegaly. No bruits or masses. Good bowel sounds. Extremities: no cyanosis, clubbing, rash, R and LLE dressing on lower extremities.  Neuro: alert & orientedx3, cranial nerves grossly intact. moves all 4 extremities w/o difficulty. Affect flat   Telemetry  ST 100-110s   EKG    No new ekg to review  Labs    CBC Recent Labs    09/16/20 1738 09/16/20 2106 09/17/2020 0210 09/19/20 0554  WBC 29.8*   < > 35.2* 30.6*  NEUTROABS 25.8*  --   --   --   HGB 8.4*   < > 7.9* 7.5*  HCT 25.6*   < > 24.6* 23.8*  MCV 90.1   < > 91.8 94.4  PLT 72*   < > 73* 85*   < > = values in this interval not displayed.   Basic Metabolic Panel Recent Labs    80/29/68 0210 09/10/2020 1709 09/19/20 0554  NA 146*  --  150*  K 4.1  --  3.8  CL 108  --  113*  CO2 30  --  31  GLUCOSE 127*  --  158*  BUN 80*  --  68*  CREATININE 1.77*  --  1.63*  CALCIUM 7.6*  --  7.7*  MG  --  2.5* 2.6*  PHOS  --  4.5 3.8   Liver Function Tests Recent Labs    09/26/2020 0210 09/19/20 0554  AST 177* 182*  ALT 355* 290*  ALKPHOS 309* 285*  BILITOT 3.1* 2.2*  PROT 4.6* 4.9*  ALBUMIN 1.8* 1.7*   No results for input(s): LIPASE, AMYLASE in the last 72 hours. Cardiac Enzymes Recent Labs    09/17/20 0504  CKTOTAL 3,219*    BNP: BNP (last 3 results) Recent Labs    09/01/2020 2114 09/14/20 0031 09/15/20 0321  BNP 1,447.9* 1,523.7* 743.8*    ProBNP (last 3 results) No results for input(s): PROBNP in the last 8760  hours.   D-Dimer No results for input(s): DDIMER in the last 72 hours. Hemoglobin A1C No results for input(s): HGBA1C in the last 72 hours. Fasting Lipid Panel No results for input(s): CHOL, HDL, LDLCALC, TRIG, CHOLHDL, LDLDIRECT in the last 72 hours. Thyroid Function Tests No results for input(s): TSH, T4TOTAL, T3FREE, THYROIDAB in the last 72 hours.  Invalid input(s): FREET3  Other results:   Imaging    MR BRAIN WO CONTRAST  Result Date: 09/22/2020 CLINICAL DATA:  Neurological deficit. Stroke suspected. Altered mental status. EXAM: MRI HEAD WITHOUT CONTRAST TECHNIQUE: Multiplanar, multiecho pulse sequences of the brain and surrounding structures were obtained without intravenous contrast. COMPARISON:  Head CT 09/25/2020 FINDINGS: Brain: 5 mm acute infarction within the inferior cerebellum on the left. 6 mm acute infarction at the left parietooccipital junction. Subacute ischemic change of the cortical and subcortical brain at the left parietooccipital junction. 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 Abnormality affects the brainstem. Cerebral hemispheres otherwise appear normal without evidence underlying small-vessel disease. No mass, hemorrhage, hydrocephalus or extra-axial collection. Vascular: Major vessels at the base of the brain show flow. Skull and upper cervical spine: Negative Sinuses/Orbits: Retention cysts at the maxillary sinus floors. Otherwise negative. Orbits negative. Other: None IMPRESSION: 1. 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 2. 6 mm acute infarction at the left parietooccipital junction. 3. Subacute ischemic changes of the cortical and subcortical brain at the left parietooccipital junction. 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 4. Flow does appear to be present in the vertebrobasilar system. Consider MR angiography the head and  neck or CT angiography of the head and neck. 5. The brain appears otherwise normal without evidence other previous insult. Electronically Signed   By: Nelson Chimes M.D.   On: 09/25/2020 14:23     Medications:     Scheduled Medications: . capsaicin   Topical BID  . Chlorhexidine Gluconate Cloth  6 each Topical Daily  . feeding supplement  1 Container Oral TID BM  . feeding supplement (PROSource TF)  45 mL Per Tube TID  . fluconazole  50 mg Oral Daily  . furosemide  40 mg Intravenous Daily  . lidocaine   Topical BID  . magic mouthwash  10 mL Oral TID  . methylPREDNISolone (SOLU-MEDROL) injection  125 mg Intravenous Q6H  . metolazone  5 mg Oral Daily  . multivitamin with minerals  1 tablet Oral Daily  . pantoprazole (PROTONIX) IV  40 mg Intravenous Q12H  .  sodium chloride flush  10-40 mL Intracatheter Q12H  . thiamine injection  100 mg Intravenous Daily    Infusions: . ceFEPime (MAXIPIME) IV 2 g (09/19/20 0625)  . DOBUTamine 7.5 mcg/kg/min (09/19/20 0600)  . feeding supplement (OSMOLITE 1.5 CAL) 1,000 mL (09/20/2020 1404)  . heparin 1,100 Units/hr (09/19/20 0600)    PRN Medications: docusate, oxyCODONE, sodium chloride flush    Patient Profile   55 y/o male w/ no prior cardiac history, admitted for mixed cardiogenic + septic shock in setting of PNA + hepatic encephalopathy.   ECHO LVEF 15% with severe global HK with regional variability + LV clot RV moderately down. Initial Co-ox 48%. Started on DBA.    Assessment/Plan   1. Cardiogenic shock with multisystem organ failure - ECHO LVEF 15% with severe global HK with regional variability + LV clot RV moderately down. - suspect this is primary issue with MSOF as a result of low output stated and decreased mental status - CO-OX 60%. Cut back DBA 6.5 mcg.  - Sodium 150. CVP 1. Hold lasix and stop metolazone.  - will need R/L cath as he recovers  2. LV thrombus - On heparin drip.   3. Shock liver with hepatic  encephalopathy - due to low output HF. Transaminases improving with hemodynamics support - Liver u/s suggestive of hepatic steatosis but not long-term cirrhosis. Though low albumin and low PLTs suggestive of more longstanding liver process  - continue supportive care  4. AKI due to ATN/shock - Baseline creatinine unknown  - SCr 1.5 in 2016 in Care Everywhere - Creatinine 2.1 in 3/22 -> 1.99->1.88->1.97->1.77 >1.6   5. CAP - PCT 3.70 -> 2.9 - BC NGTD  - was on cefepime, now on Ancef   6. Rhabdomyolysis - improving, CK down trending  - muscle biopsy pending   7. Hyperkalemia - resolved   8. Acute Anemia - Unexplained drop in Hgb from 15 on admit>> 8.4.  - CT of A/P negative for RPB  - transfused 1 unit 4/18, Hgb 8.4>>8.7>8.0>>7.9 today  - WBC 35K, Plt 73K  - Associated leukocytosis + thrombocytopenia raises concern for acute hematologic process ? Leukemia. Blood smear pending. CT of chest w/o signs of malignancy  ? Myositis. Started on steroids.  - Muscle biopsy results pending.  - hematology following, Concern for autoimmune/ inflammatory process.    9. Hypernatremia  Sodium 150. Hold lasix.   Length of Stay: [place], NP  09/19/2020, 8:30 AM  Advanced Heart Failure Team Pager [phone] (M-F; 7a - 5p)  Please contact [facility] for night-coverage after hours (5p -7a ) and weekends on [email or web address]  Agree with above.  Remains very weak. Feet hurt. Remains on DBA 5 and IV lasix. Weight down 20 pounds. Co-ox 60%.  Now on pulse steroids. Hgb dropping. Na 150  General:  Weak appearing. LethargicNo resp difficulty HEENT: normal Neck: supple. no JVD. Carotids 2+ bilat; no bruits. No lymphadenopathy or thryomegaly appreciated. Cor: PMI nondisplaced. Regular rate & rhythm. No rubs, gallops or murmurs. Lungs: clear Abdomen: soft, nontender, nondistended. No hepatosplenomegaly. No bruits or masses. Good bowel sounds. Extremities: no cyanosis, clubbing, rash, tr-1+  edema + UNNA Neuro: lethargic but communicative. Diffusely weak   Unifying diagnosis remains unclear. Serologies unrevealing so far. Await result of muscle biopsy.   Continue DBA. Stop lasix with hypernatremia. Will need FW or IVF.   Continue pulse steroids for now. Await response.   CRITICAL CARE Performed by: Glori Bickers  Total critical care time:  35 minutes  Critical care time was exclusive of separately billable procedures and treating other patients.  Critical care was necessary to treat or prevent imminent or life-threatening deterioration.  Critical care was time spent personally by me (independent of midlevel providers or residents) on the following activities: development of treatment plan with patient and/or surrogate as well as nursing, discussions with consultants, evaluation of patient's response to treatment, examination of patient, obtaining history from patient or surrogate, ordering and performing treatments and interventions, ordering and review of laboratory studies, ordering and review of radiographic studies, pulse oximetry and re-evaluation of patient's condition.  Glori Bickers, MD  10:39 PM

## 2020-09-19 NOTE — Progress Notes (Signed)
NAME:  Dennis Popoff., MRN:  [id number], DOB:  1965/09/23, LOS: 6 ADMISSION DATE:  09/12/2020, CONSULTATION DATE: 09/10/2020 REFERRING MD: Wilkie Aye - EM CHIEF COMPLAINT:  Altered Mental Status  History of Present Illness:  55 y/o man with a past medical history of chronic kidney disease, hypertension who presented to [facility] on 4/15 with CC AMS.  Symptoms started a week prior to admit.  His baseline mental status is alert and oriented to person place time and event.  Mental status changes were described as somnolent and decreased responsiveness.  He has associated lower extremity swelling, weight gain, shortness of breath, and cough. No associated chest pain, fever, syncope. He has never had these symptoms before.  He was recently seen at [facility] emergency department on 08/18/2020 for cough and shortness of breath.  He was diagnosed with pneumonia and discharged home with doxycycline.  He returned to [facility] 4/15 with altered mental status and lower extremity swelling which were not present at the time of his emergency department visit.  He works for [employer].  Former tobacco smoker.  Quit years ago.  No alcohol.  Marijuana daily use per patient for ~ 20 years.    Pertinent  Medical History  CKD III - sr cr 2013 1.6 HTN Bronchitis  Significant Hospital Events: Including procedures, antibiotic start and stop dates in addition to other pertinent events   . 4/15 Admitted to ICU. Treated with azithro, ceftriaxone, flagyl, unasyn. Tylenol, acetaminophen, ETOH, influenza, COVID, hepatitis, urine strep antigen, legionella, & UDS negative.  . 4/16 ECHO with global hypokinesis, anteroseptal wall and apex are akinetic, anterior wall severely hypokinetic, very large layered semi-mobile apical thrombus extending from the apex along the anteroseptal wall, LVEF ~15-20%, RV systolic function mild to moderately reduced.  tarted on dobuatmine drip. ANA negative.  . 4/17 Unasyn stopped, cefepime initiated  . 4/18  Dobutamine at , net neg 1.7L, tolerating soft diet, no n/v . 4/19 Dobutamine increased to 7.58mcg  Interim History / Subjective:   I/O  -1L in last 24 hours (-11.1L overall) On dobutamine at 7.14mcg, SvO2 60  No acute events overnight. Patient tolerated muscle biopsy well yesterday.   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 Subacute ischemic changes of the cortical subcortical brain.  Patient continues to have pain of his lower extremities  Objective   Blood pressure 133/86, pulse (!) 108, temperature 98.1 F (36.7 C), temperature source Axillary, resp. rate 15, height 6' (1.829 m), weight 73.5 kg, SpO2 99 %. CVP:  [6 mmHg-8 mmHg] 8 mmHg      Intake/Output Summary (Last 24 hours) at 09/19/2020 0842 Last data filed at 09/19/2020 0600 Gross per 24 hour  Intake 1280.15 ml  Output 2300 ml  Net -1019.85 ml   Filed Weights   09/17/20 0500 09/21/2020 0144 09/19/20 0500  Weight: 76 kg 77.4 kg 73.5 kg    Examination: General: ill appearing adult male sitting up in bed in NAD HEENT: MM pink/moist, anicteric, good dentition, thrush and oral lesions have cleared  Neuro: Awakens, alert, weakness greatest in lower extremities CV: s1s2, tachycardic, no m/r/g PULM: non-labored at rest, lungs bilaterally clear anterior GI: soft, bsx4 active  Extremities: warm/dry, BLE 1+ edema,  Skin: Ecchymotic appearing lesions on toes.    Resolved Hospital Problem list   Encephalopathy  Assessment & Plan:   Cardiogenic Shock Acute Biventricular Heart Failure LV Thrombus Unknown etiology of heart failure at  this time. Ischemic vs infectious vs inflammatory.  Concern that this is not a typical presentation of heart failure, query underlying autoimmune process or malignancy.  -appreciate Cardiology evaluation  -continue dobutamine, follow SvO2  -continue heparin gtt -follow up antiphospholipid antibody  from 4/16  -Change lasix from 40mg  BID to 40mg  daily with 5mg  of metolazone given hypernatremia -will likely need L/RHC at some point   ?Polymyositis Concern for systemic inflammatory disorder given elevated CRP, ESR, aldolase and CK with severe weakness of lower extremities and pulmonary opacities. - MyoMarker 3 panel pending along with other autoimmune workup. ANCA and ANA panel are negative. - Muscle biopsy obtained 4/20, f/u results - Started solumedrol 125mg  q6hrs on 4/20. Plan to continue through evening of 4/23 and then transition to prednisone 1mg /kg on 4/24. He should remain on 1mg /kg for 4-6 weeks and then tapered after that. He will likely need rheumatology follow up.  Acute Embolic Strokes Generalized weakness Thought secondary to inflammatory process. Neurology consulted for further evaluation. MRI has confirmed acute and subacute strokes - cont heparin drip - weakness may be component of myositis, on steroids.  Pulmonary opacities Aspiration Pneumonia Multifocal opacities on chest CT concerning for infectious vs inflammatory lesions -Continue cefepime, abx changed 4/17. Plan for 7 days duration  -follow intermittent CXR  Congestive Hepatopathy -follow LFT's, improving -cortrak for nutrition   Acute on Chronic Kidney Disease stage IIIA-B Lactic Acidosis Note uses NSAIDS, ACE-I at home and had contrast on 3/28 -lasix as above for CHF  -Trend BMP / urinary output -Replace electrolytes as indicated -Avoid nephrotoxic agents as able, ensure adequate renal perfusion   Anemia  Thrombocytopenia  Thought in setting of hepatic congestion  -monitor trend -heparin on hold  -transfuse for Hgb <7% - Hematology consulted, low concern for TTP at this time  Elevated Creatinine Kinase / Rhabdomyolysis On statin at PTA  -follow CK, trending down  Acute Metabolic Encephalopathy  -supportive care, improved -follow up ammonia   Moderate Malnutrition  -tube  feeds  Thrush -magic mouthwash TID  -fluconazole   Best practice (right click and "Reselect all SmartList Selections" daily)  Diet:  Tube Feed , swallow eval today to start PO diet Pain/Anxiety/Delirium protocol (if indicated): No VAP protocol (if indicated): Not indicated DVT prophylaxis: Systemic AC GI prophylaxis: PPI Glucose control:  SSI No Central venous access:  Yes, and it is still needed Arterial line:  N/A Foley:  N/A,  Mobility:  bed rest  PT consulted: N/A Last date of multidisciplinary goals of care discussion - N/A Code Status:  full code Disposition: ICU  Labs   CBC: Recent Labs  Lab 09/06/2020 1634 09/03/2020 1839 09/16/20 1738 09/16/20 2106 09/17/20 0500 09/17/20 0804 09/17/20 1720 09/28/2020 0210 09/19/20 0554  WBC 18.4*   < > 29.8*  --  35.1*  --  34.0* 35.2* 30.6*  NEUTROABS 16.2*  --  25.8*  --   --   --   --   --   --   HGB 14.2   < > 8.4* 8.7* 8.7* 8.5* 8.0* 7.9* 7.5*  HCT 40.3   < > 25.6* 26.5* 25.6*  --  23.7* 24.6* 23.8*  MCV 83.6   < > 90.1  --  88.3  --  89.4 91.8 94.4  PLT 121*   < > 72*  --  69*  --  71* 73* 85*   < > = values in this interval not displayed.    Basic Metabolic Panel: Recent Labs  Lab 09/21/2020  2300 09/14/20 0031 09/14/20 0940 09/15/20 0321 09/16/20 0501 09/17/20 0500 09/09/2020 0210 09/02/2020 1709 09/19/20 0554  NA  --  135   < > 144 145 145 146*  --  150*  K  --  5.9*   < > 4.9 4.5 4.5 4.1  --  3.8  CL  --  103   < > 111 112* 110 108  --  113*  CO2  --  21*   < > $R'25 28 28 30  'GY$ --  31  GLUCOSE  --  122*   < > 137* 132* 140* 127*  --  158*  BUN  --  99*   < > 96* 97* 98* 80*  --  68*  CREATININE  --  1.86*   < > 1.99* 1.88* 1.97* 1.77*  --  1.63*  CALCIUM  --  7.7*   < > 7.6* 7.5* 7.5* 7.6*  --  7.7*  MG 2.8* 2.8*  --   --   --   --   --  2.5* 2.6*  PHOS 4.2 4.0  --   --   --   --   --  4.5 3.8   < > = values in this interval not displayed.   GFR: Estimated Creatinine Clearance: 54.5 mL/min (A) (by C-G formula  based on SCr of 1.63 mg/dL (H)). Recent Labs  Lab 09/10/2020 1802 09/14/2020 2114 09/14/20 0031 09/14/20 0940 09/15/20 0321 09/15/20 0836 09/16/20 0501 09/17/20 0500 09/17/20 1239 09/17/20 1720 09/16/2020 0210 09/19/20 0554  PROCALCITON  --   --   --  3.70  --  2.90  --   --   --   --   --   --   WBC  --   --    < > 25.3*   < >  --    < > 35.1*  --  34.0* 35.2* 30.6*  LATICACIDVEN 2.2* 2.7*  --   --   --   --   --   --  2.9* 2.2*  --   --    < > = values in this interval not displayed.    Liver Function Tests: Recent Labs  Lab 09/15/20 0321 09/16/20 0501 09/17/20 0500 09/27/2020 0210 09/19/20 0554  AST 476* 244* 189* 177* 182*  ALT 833* 576* 401* 355* 290*  ALKPHOS 689* 472* 348* 309* 285*  BILITOT 4.6* 3.4* 3.3* 3.1* 2.2*  PROT 4.9* 4.5* 4.2* 4.6* 4.9*  ALBUMIN 1.9* 1.7* 1.6* 1.8* 1.7*   Recent Labs  Lab 09/25/2020 2300  LIPASE 142*   Recent Labs  Lab 09/03/2020 1634 09/17/20 0951  AMMONIA 65* 17    ABG    Component Value Date/Time   PHART 7.437 09/23/2020 2258   PCO2ART 29.3 (L) 09/04/2020 2258   PO2ART 50 (L) 09/25/2020 2258   HCO3 21.7 09/14/2020 0031   TCO2 21 (L) 09/12/2020 2258   ACIDBASEDEF 1.8 09/14/2020 0031   O2SAT 60.3 09/19/2020 0554     Coagulation Profile: Recent Labs  Lab 09/14/2020 2128 09/17/20 0804  INR 1.7* 1.4*    Cardiac Enzymes: Recent Labs  Lab 09/14/20 0031 09/14/20 0940 09/15/20 0836 09/17/20 0504  CKTOTAL 5,299* 6,502* 4,025* 3,219*  CKMB  --   --  14.1*  --     HbA1C: No results found for: HGBA1C  CBG: Recent Labs  Lab 09/12/2020 1544 09/21/2020 2051 09/08/2020 2310 09/19/20 0457 09/19/20 0754  GLUCAP 114* 173* 147* 154* 160*  Critical care time: 40 minutes    Freda Jackson, MD [facility] Office: [phone]   See Amion for personal pager PCCM on call pager [phone] until 7pm. Please call Elink 7p-7a. [phone]

## 2020-09-19 NOTE — Evaluation (Signed)
Occupational Therapy Evaluation Patient Details Name: Dennis Howard. MRN: [id number] DOB: August 13, 1965 Today's Date: 09/19/2020    History of Present Illness 55 y/o man who presented to [facility] on 4/15 with  AMS, cardiogenic + septic shock in setting of PNA + hepatic encephalopathy. Also with LV thrombus and NSTEMI.    MRI showing 72mm acute infarction at the inferior cerebellum on left and 66mm acute infarction at the left parietooccipital junction. Subacute ischemic changes of the cortical subcortical brain.  He has associated lower extremity swelling, weight gain, shortness of breath, and cough. Pending workup for neuromuscular disorder. S/P L quadriceps biopsy 4/20. PMH: chronic kidney disease, hypertension, Marijuana daily for 20 + years   Clinical Impression   PTA patient independent with ADLs, mobility. Admitted for above and limited by problem list below, including impaired balance, decreased activity tolerance, impaired cognition, and generalized weakness.  Patient lethargic and fatigued upon entry, but agreeable to OT/PT session. He keeps his eyes closed throughout the session, but able to follow most simple 1 step commands with increased time. Mainly shakes head yes/no, with noted decreased awareness, problem solving, and recall.  He requires total assist +2 for bed mobility, max assist sitting EOB with heavy posterior and R lateral lean, max -total assist for all ADLs at this time. SpO2 pleuth decreased during session but poor waveform- improved when RN transitioned reading to ear and pt asymptomatic.  BP decreased to 90s/60s with sitting EOB, and pt with decreased responsiveness--transitioned back to supine.  Based on performance today, believe patient will benefit from CIR level rehab to optimize independence, safety with ADLs and mobility to decrease burden of care.  Will follow acutely.      Follow Up Recommendations  CIR;Supervision/Assistance - 24 hour    Equipment Recommendations   Other (comment) (TBD at next venue of care)    Recommendations for Other Services Rehab consult     Precautions / Restrictions Precautions Precautions: Fall Restrictions Weight Bearing Restrictions: No      Mobility Bed Mobility Overal bed mobility: Needs Assistance Bed Mobility: Supine to Sit;Sit to Supine     Supine to sit: Total assist;+2 for physical assistance Sit to supine: Total assist;+2 for physical assistance   General bed mobility comments: total +2 for all aspects, heavy posterior and R lateral leaning once sitting EOB and BP dropped to 90s/60s with decreased pt responsiveness.    Transfers                 General transfer comment: deferred, poor sitting tolerance    Balance Overall balance assessment: Needs assistance Sitting-balance support: Bilateral upper extremity supported;Feet unsupported Sitting balance-Leahy Scale: Zero Sitting balance - Comments: heavy posterior and R lateral leaning, max-total assist to maintain upright Postural control: Posterior lean;Right lateral lean                                 ADL either performed or assessed with clinical judgement   ADL Overall ADL's : Needs assistance/impaired     Grooming: Maximal assistance;Bed level                               Functional mobility during ADLs: Total assistance;+2 for physical assistance;+2 for safety/equipment General ADL Comments: overall total assist for ADLS, able to engage minimally in grooming     Vision   Additional Comments: further assessment required, able  to open eyes when cued but immediately closes     Perception     Praxis      Pertinent Vitals/Pain Pain Assessment: Faces Faces Pain Scale: No hurt Pain Intervention(s): Limited activity within patient's tolerance;Monitored during session     Hand Dominance Right   Extremity/Trunk Assessment Upper Extremity Assessment Upper Extremity Assessment: Difficult to assess due  to impaired cognition;Generalized weakness (able to hold BUEs against gravity, but doesnt lift UEs when asked)   Lower Extremity Assessment Lower Extremity Assessment: Defer to PT evaluation       Communication Communication Communication: No difficulties   Cognition Arousal/Alertness: Lethargic Behavior During Therapy: Flat affect Overall Cognitive Status: Impaired/Different from baseline Area of Impairment: Orientation;Memory;Following commands;Safety/judgement;Awareness;Problem solving;Attention                 Orientation Level: Disoriented to;Situation;Time;Place Current Attention Level: Focused Memory: Decreased recall of precautions;Decreased short-term memory Following Commands: Follows one step commands inconsistently;Follows one step commands with increased time Safety/Judgement: Decreased awareness of safety;Decreased awareness of deficits Awareness: Intellectual Problem Solving: Slow processing;Decreased initiation;Difficulty sequencing;Requires verbal cues;Requires tactile cues General Comments: Pt with eyes closed majority of session, able to follow simple 1 step commands with increased time (ie- open eyes, give thumbs up, bring hand to mouth, etc) but limited verablizations and mainly nodding yes/no, and nodding yes to "being tired of answering questions". Decreased seqencing functionally.   General Comments  Spo2 reading of 60% sitting EOB, but poor pleth and pt demonstrating no difficulty breathing. RN changed location of pulse ox from finger to ear for improved signal; BP dropped to 90s/60s at EOB    Exercises     Shoulder Instructions      Home Living Family/patient expects to be discharged to:: Private residence Living Arrangements: Parent Available Help at Discharge: Family;Available PRN/intermittently Type of Home: House Home Access: Level entry     Home Layout: Multi-level;Bed/bath upstairs     Bathroom Shower/Tub: Contractor: Standard     Home Equipment: None          Prior Functioning/Environment Level of Independence: Independent        Comments: Drove for Door Dash        OT Problem List: Decreased strength;Decreased activity tolerance;Impaired balance (sitting and/or standing);Impaired vision/perception;Decreased coordination;Decreased safety awareness;Decreased cognition;Decreased knowledge of use of DME or AE;Decreased knowledge of precautions;Cardiopulmonary status limiting activity      OT Treatment/Interventions: Self-care/ADL training;Neuromuscular education;DME and/or AE instruction;Therapeutic activities;Cognitive remediation/compensation;Patient/family education;Balance training;Visual/perceptual remediation/compensation    OT Goals(Current goals can be found in the care plan section) Acute Rehab OT Goals Patient Stated Goal: none stated OT Goal Formulation: Patient unable to participate in goal setting Time For Goal Achievement: 10/03/20 Potential to Achieve Goals: Fair  OT Frequency: Min 2X/week   Barriers to D/C:            Co-evaluation PT/OT/SLP Co-Evaluation/Treatment: Yes Reason for Co-Treatment: Complexity of the patient's impairments (multi-system involvement);Necessary to address cognition/behavior during functional activity;For patient/therapist safety;To address functional/ADL transfers PT goals addressed during session: Mobility/safety with mobility;Balance OT goals addressed during session: ADL's and self-care      AM-PAC OT "6 Clicks" Daily Activity     Outcome Measure Help from another person eating meals?: Total Help from another person taking care of personal grooming?: A Lot Help from another person toileting, which includes using toliet, bedpan, or urinal?: Total Help from another person bathing (including washing, rinsing, drying)?: Total Help from another person to put on and  taking off regular upper body clothing?: Total Help from another person  to put on and taking off regular lower body clothing?: Total 6 Click Score: 7   End of Session Nurse Communication: Mobility status  Activity Tolerance: Patient limited by lethargy;Patient limited by fatigue Patient left: in bed;with call bell/phone within reach;with bed alarm set;with nursing/sitter in room;with family/visitor present  OT Visit Diagnosis: Other abnormalities of gait and mobility (R26.89);Muscle weakness (generalized) (M62.81);Other symptoms and signs involving the nervous system (R29.898);Other symptoms and signs involving cognitive function                Time: 1441-1455 OT Time Calculation (min): 14 min Charges:  OT General Charges $OT Visit: 1 Visit OT Evaluation $OT Eval High Complexity: 1 High  Barry Brunner, OT Acute Rehabilitation Services Pager [phone] Office [phone]   Chancy Milroy 09/19/2020, 4:03 PM

## 2020-09-19 NOTE — Progress Notes (Signed)
eLink Physician-Brief Progress Note Patient Name: Dennis Howard. DOB: January 18, 1966 MRN: [id number]   Date of Service  09/19/2020  HPI/Events of Note  Hypokalemia - K+ = 3.3 and Creatinine = 2.04.   eICU Interventions  Will replace K+.     Intervention Category Major Interventions: Electrolyte abnormality - evaluation and management  [name] 09/19/2020, 7:03 PM

## 2020-09-19 NOTE — Progress Notes (Signed)
Physical Therapy Treatment Patient Details Name: Dennis Howard. MRN: [id number] DOB: [birth year] Today's Date: 09/19/2020    History of Present Illness 55 y/o man who presented to [facility] on 4/15 with  AMS, cardiogenic + septic shock in setting of PNA + hepatic encephalopathy. Also with LV thrombus and NSTEMI. He has associated lower extremity swelling, weight gain, shortness of breath, and cough. MRI showing 60mm acute infarction at the inferior cerebellum on left and 31mm acute infarction at the left parietooccipital junction. Subacute ischemic changes of the cortical subcortical brain. s/p muscle biopsy quadriceps on 4/20. PMH: chronic kidney disease, hypertension, Marijuana daily for 20 + years    PT Comments    Pt drowsy upon PT and OT arrival to room, agreeable to EOB sitting trial with assist. Pt requiring total +2 assist for to and from EOB today, and max-total posterior support once sitting to maintain upright balance. Pt with decreased responsiveness once EOB, with BP drop to 90s/60s, returned to supine. Pt with weeping from L open blister, RN applied foam dressing per PT request. PT to continue to follow acutely.    Follow Up Recommendations  CIR     Equipment Recommendations  Rolling walker with 5" wheels;3in1 (PT)    Recommendations for Other Services       Precautions / Restrictions Precautions Precautions: Fall Restrictions Weight Bearing Restrictions: No    Mobility  Bed Mobility Overal bed mobility: Needs Assistance Bed Mobility: Supine to Sit;Sit to Supine     Supine to sit: Total assist;+2 for physical assistance Sit to supine: Total assist;+2 for physical assistance   General bed mobility comments: total +2 for all aspects, heavy posterior leaning once sitting EOB and BP dropped to 90s/60s with decreased pt responsiveness.    Transfers                 General transfer comment: deferred, poor sitting tolerance  Ambulation/Gait                  Stairs             Wheelchair Mobility    Modified Rankin (Stroke Patients Only) Modified Rankin (Stroke Patients Only) Pre-Morbid Rankin Score: No symptoms Modified Rankin: Severe disability     Balance Overall balance assessment: Needs assistance Sitting-balance support: Bilateral upper extremity supported;Feet unsupported Sitting balance-Leahy Scale: Zero Sitting balance - Comments: heavy posterior leaning, max-total assist to maintain upright Postural control: Posterior lean                                  Cognition Arousal/Alertness: Awake/alert Behavior During Therapy: Flat affect (kept eyes closed entire eval) Overall Cognitive Status: Impaired/Different from baseline Area of Impairment: Orientation;Memory;Following commands;Safety/judgement;Awareness;Problem solving                 Orientation Level: Disoriented to;Situation;Time;Place     Following Commands: Follows one step commands inconsistently;Follows one step commands with increased time Safety/Judgement: Decreased awareness of safety;Decreased awareness of deficits   Problem Solving: Slow processing;Decreased initiation;Difficulty sequencing;Requires verbal cues;Requires tactile cues General Comments: Pt answering very few subjective questions, responding yes/no via head nods only. pt nods "yes" he is tired of being asked questions. Increased processing and response time to questions, and slow initiation of mobility when cued.      Exercises      General Comments General comments (skin integrity, edema, etc.): Spo2 reading of 60% sitting EOB, but poor pleth  and pt demonstrating no difficulty breathing. RN changed location of pulse ox from finger to ear for improved signal      Pertinent Vitals/Pain Pain Assessment: Faces Faces Pain Scale: No hurt Pain Intervention(s): Limited activity within patient's tolerance;Monitored during session    Home Living                       Prior Function            PT Goals (current goals can now be found in the care plan section) Acute Rehab PT Goals PT Goal Formulation: With patient Time For Goal Achievement: 10/01/20 Potential to Achieve Goals: Good Progress towards PT goals: Progressing toward goals    Frequency    Min 4X/week      PT Plan Current plan remains appropriate    Co-evaluation PT/OT/SLP Co-Evaluation/Treatment: Yes Reason for Co-Treatment: For patient/therapist safety;To address functional/ADL transfers;Necessary to address cognition/behavior during functional activity;Complexity of the patient's impairments (multi-system involvement) PT goals addressed during session: Mobility/safety with mobility;Balance OT goals addressed during session: ADL's and self-care      AM-PAC PT "6 Clicks" Mobility   Outcome Measure  Help needed turning from your back to your side while in a flat bed without using bedrails?: A Lot Help needed moving from lying on your back to sitting on the side of a flat bed without using bedrails?: Total Help needed moving to and from a bed to a chair (including a wheelchair)?: Total Help needed standing up from a chair using your arms (e.g., wheelchair or bedside chair)?: Total Help needed to walk in hospital room?: Total Help needed climbing 3-5 steps with a railing? : Total 6 Click Score: 7    End of Session Equipment Utilized During Treatment: Oxygen Activity Tolerance: Patient limited by fatigue Patient left: in bed;with call bell/phone within reach;with bed alarm set;with family/visitor present;with nursing/sitter in room (bed in chair position) Nurse Communication: Mobility status PT Visit Diagnosis: Unsteadiness on feet (R26.81);Muscle weakness (generalized) (M62.81)     Time: 6789-3810 PT Time Calculation (min) (ACUTE ONLY): 14 min  Charges:  $Therapeutic Activity: 8-22 mins                     Marye Round, PT DPT Acute Rehabilitation  Services Pager [phone]  Office [phone]    Truddie Coco 09/19/2020, 3:44 PM

## 2020-09-19 NOTE — Final Consult Note (Signed)
Consultant Final Sign-Off Note    Assessment/Final recommendations  Dennis Howard. is a 55 y.o. male followed by me for extremity weakness. We were asked to do a muscle biopsy. Patient is s/p quadriceps muscle biopsy by Dr. Feliciana Rossetti on 4/20.   Wound care (if applicable): Patient may shower, let soapy water run over the incision - pat dry. Do not submerge in water for 2 weeks.    Diet at discharge: per primary team   Activity at discharge: per primary team   Follow-up appointment:  PRN   Pending results:  Unresulted Labs (From admission, onward)          Start     Ordered   09/20/20 0500  Heparin level (unfractionated)  Daily,   R     Question:  Specimen collection method  Answer:  Unit=Unit collect   09/25/2020 1851   09/19/20 1000  Heparin level (unfractionated)  Once-Timed,   TIMED       Question:  Specimen collection method  Answer:  Unit=Unit collect   09/19/20 0151   09/19/20 0500  Miscellaneous [facility] test (send-out)  Tomorrow morning,   R       Question:  Test name / description:  Answer:  ELFY1; MyoMarker 3 Profile [facility] ([email or web address]#Clinical-[email or web address])   09/20/2020 0734   08/31/2020 1700  Magnesium  (ICU Tube Feeding: PEPuP )  5A & 5P,   R     Question:  Specimen collection method  Answer:  Unit=Unit collect   09/27/2020 1042   09/22/2020 1700  Phosphorus  (ICU Tube Feeding: PEPuP )  5A & 5P,   R     Question:  Specimen collection method  Answer:  Unit=Unit collect   09/07/2020 1042   09/22/2020 1307  Vitamin B6  Once,   R       Question:  Specimen collection method  Answer:  Unit=Unit collect   09/06/2020 1307   09/27/2020 1216  Fungitell, Serum  Once,   R       Question:  Specimen collection method  Answer:  Unit=Unit collect   09/22/2020 1215   09/04/2020 1212  QuantiFERON-TB Gold Plus  Once,   R       Question:  Specimen collection method  Answer:  Unit=Unit collect   09/01/2020 1211   09/16/2020 1211  RPR  Once,   R        Question:  Specimen collection method  Answer:  Unit=Unit collect   09/15/2020 1210   09/17/20 1043  Mpo/pr-3 (anca) antibodies  (Anti-Neutrophilic Cystoplasmic Antibody Panel (PNL))  Once,   R       Question:  Specimen collection method  Answer:  Unit=Unit collect   09/17/20 1042   09/17/20 0912  Miscellaneous [facility] test (send-out)  Once,   R       Comments: Test Id Test Order Name Order LOINC ValueFMMPP MyoMarker 3 Plus Profile     [id number] Anti-Jo-1 Ab [phone] Anti-PL-7 Ab [id number] Anti-PL-12 Ab [id number] Anti-EJ Ab [id number] Anti-OJ Ab [id number] Anti-SRP Ab (581) 120-2052 Anti-Mi-2-Ab [id number] Anti-U3 RNP (Fibrillarin) [id number] Anti-MDA-5 Ab (CADM-140) [id number] Anti-NXP-2 (P140) Ab [id number] Anti-TIF-1gamma Ab [id number] Anti-PM/Scl-100 Ab [id number] Anti-U2 RNP Ab [id number] Anti-U1 RNP Ab [id number] Anti-Ku Ab [id number] Anti-SS-A 52kD Ab, IgG [id number] Anti-SAE1 Ab, IgG [id number]   Question:  Test name / description:  Answer:  MyoMarker   09/17/20 0915   09/15/20 0500  CBC  Daily,   R      09/14/20 0026   09/15/20 0500  Comprehensive metabolic panel  Daily,   R      09/14/20 1352   09/14/20 0912  Antiphospholipid syndrome eval, bld  Once,   R        09/14/20 0911   2020-10-07 2200  Culture, Respiratory w Gram Stain  Once,   STAT        Oct 07, 2020 2201   Oct 07, 2020 2133  Respiratory (~20 pathogens) panel by PCR  (Respiratory panel by PCR (~20 pathogens, ~24 hr TAT)  w precautions)  Once,   STAT        Oct 07, 2020 2151           Medication recommendations:   Other recommendations: Typical post-operative wound care monitoring for signs of infection (fever, chills, n/v, erythema, heat, drainage etc)     Thank you for allowing [place] to participate in the care of your patient!  Please consult [place] again if you have further needs for your patient.  [facility] 09/19/2020 7:49 AM    Subjective   Denies pain to bx site.   Objective  Vital  signs in last 24 hours: Temp:  [97.1 F (36.2 C)-98.2 F (36.8 C)] 98.1 F (36.7 C) (04/21 0400) Pulse Rate:  [105-112] 108 (04/21 0630) Resp:  [0-18] 15 (04/21 0700) BP: (115-163)/(79-105) 133/86 (04/21 0700) SpO2:  [82 %-100 %] 99 % (04/21 0630) Weight:  [73.5 kg] 73.5 kg (04/21 0500)  General: Awake and alert Wound: Left quadriceps biopsy site incision c/d/i with dermabond overlying. No surrounding signs of cellulitis. No bruising.    Pertinent labs and Studies: Recent Labs    09/17/20 1720 09/26/2020 0210 09/19/20 0554  WBC 34.0* 35.2* 30.6*  HGB 8.0* 7.9* 7.5*  HCT 23.7* 24.6* 23.8*   BMET Recent Labs    09/17/2020 0210 09/19/20 0554  NA 146* 150*  K 4.1 3.8  CL 108 113*  CO2 30 31  GLUCOSE 127* 158*  BUN 80* 68*  CREATININE 1.77* 1.63*  CALCIUM 7.6* 7.7*   No results for input(s): LABURIN in the last 72 hours. Results for orders placed or performed during the hospital encounter of 10/07/2020  Culture, blood (routine x 2)     Status: None   Collection Time: 2020-10-07  6:00 PM   Specimen: BLOOD RIGHT HAND  Result Value Ref Range Status   Specimen Description BLOOD RIGHT HAND  Final   Special Requests   Final    BOTTLES DRAWN AEROBIC AND ANAEROBIC Blood Culture results may not be optimal due to an inadequate volume of blood received in culture bottles   Culture   Final    NO GROWTH 5 DAYS Performed at [facility], [address] [place], [place] [address]    Report Status 08/31/2020 FINAL  Final  Resp Panel by RT-PCR (Flu A&B, Covid) Urine, Clean Catch     Status: None   Collection Time: Oct 07, 2020  6:02 PM   Specimen: Urine, Clean Catch; Nasopharyngeal(NP) swabs in vial transport medium  Result Value Ref Range Status   SARS Coronavirus 2 by RT PCR NEGATIVE NEGATIVE Final    Comment: (NOTE) SARS-CoV-2 target nucleic acids are NOT DETECTED.  The SARS-CoV-2 RNA is generally detectable in upper respiratory specimens during the acute phase of infection. The  lowest concentration of SARS-CoV-2 viral copies this assay can detect is 138 copies/mL. A negative result does not preclude SARS-Cov-2 infection and should not be used as the sole basis for treatment or other patient management decisions. A negative result may occur  with  improper specimen collection/handling, submission of specimen other than nasopharyngeal swab, presence of viral mutation(s) within the areas targeted by this assay, and inadequate number of viral copies(<138 copies/mL). A negative result must be combined with clinical observations, patient history, and epidemiological information. The expected result is Negative.  Fact Sheet for Patients:  BloggerCourse.com  Fact Sheet for Healthcare Providers:  SeriousBroker.it  This test is no t yet approved or cleared by the [place] FDA and  has been authorized for detection and/or diagnosis of SARS-CoV-2 by FDA under an Emergency Use Authorization (EUA). This EUA will remain  in effect (meaning this test can be used) for the duration of the COVID-19 declaration under Section 564(b)(1) of the Act, 21 U.S.C.section 360bbb-3(b)(1), unless the authorization is terminated  or revoked sooner.       Influenza A by PCR NEGATIVE NEGATIVE Final   Influenza B by PCR NEGATIVE NEGATIVE Final    Comment: (NOTE) The Xpert Xpress SARS-CoV-2/FLU/RSV plus assay is intended as an aid in the diagnosis of influenza from Nasopharyngeal swab specimens and should not be used as a sole basis for treatment. Nasal washings and aspirates are unacceptable for Xpert Xpress SARS-CoV-2/FLU/RSV testing.  Fact Sheet for Patients: BloggerCourse.com  Fact Sheet for Healthcare Providers: SeriousBroker.it  This test is not yet approved or cleared by the [place] FDA and has been authorized for detection and/or diagnosis of SARS-CoV-2 by FDA under  an Emergency Use Authorization (EUA). This EUA will remain in effect (meaning this test can be used) for the duration of the COVID-19 declaration under Section 564(b)(1) of the Act, 21 U.S.C. section 360bbb-3(b)(1), unless the authorization is terminated or revoked.  Performed at [facility], [address] [place], [place] [address]   MRSA PCR Screening     Status: None   Collection Time: 09/04/2020 11:42 PM   Specimen: Nasopharyngeal  Result Value Ref Range Status   MRSA by PCR NEGATIVE NEGATIVE Final    Comment:        The GeneXpert MRSA Assay (FDA approved for NASAL specimens only), is one component of a comprehensive MRSA colonization surveillance program. It is not intended to diagnose MRSA infection nor to guide or monitor treatment for MRSA infections. Performed at [facility], [address], [place], [place] [address]   Culture, blood (routine x 2)     Status: None (Preliminary result)   Collection Time: 09/14/20 12:12 AM   Specimen: BLOOD  Result Value Ref Range Status   Specimen Description BLOOD LEFT ANTECUBITAL  Final   Special Requests AEROBIC BOTTLE ONLY Blood Culture adequate volume  Final   Culture   Final    NO GROWTH 4 DAYS Performed at [facility], [address] [place], [place] [address]    Report Status PENDING  Incomplete  Culture, blood (routine x 2)     Status: None (Preliminary result)   Collection Time: 09/14/20 12:12 AM   Specimen: BLOOD LEFT HAND  Result Value Ref Range Status   Specimen Description BLOOD LEFT HAND  Final   Special Requests AEROBIC BOTTLE ONLY Blood Culture adequate volume  Final   Culture   Final    NO GROWTH 4 DAYS Performed at [facility], [address], [place], [place] [address]    Report Status PENDING  Incomplete    Imaging: MR BRAIN WO CONTRAST  Result Date: 2020-10-09 CLINICAL DATA:  Neurological deficit. Stroke suspected. Altered mental status. EXAM: MRI HEAD  WITHOUT CONTRAST  TECHNIQUE: Multiplanar, multiecho pulse sequences of the brain and surrounding structures were obtained without intravenous contrast. COMPARISON:  Head CT 09/12/2020 FINDINGS: Brain: 5 mm acute infarction within the inferior cerebellum on the left. 6 mm acute infarction at the left parietooccipital junction. Subacute ischemic change of the cortical and subcortical brain at the left parietooccipital junction. 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 Abnormality affects the brainstem. Cerebral hemispheres otherwise appear normal without evidence underlying small-vessel disease. No mass, hemorrhage, hydrocephalus or extra-axial collection. Vascular: Major vessels at the base of the brain show flow. Skull and upper cervical spine: Negative Sinuses/Orbits: Retention cysts at the maxillary sinus floors. Otherwise negative. Orbits negative. Other: None IMPRESSION: 1. 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 2. 6 mm acute infarction at the left parietooccipital junction. 3. Subacute ischemic changes of the cortical and subcortical brain at the left parietooccipital junction. 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 4. Flow does appear to be present in the vertebrobasilar system. Consider MR angiography the head and neck or CT angiography of the head and neck. 5. The brain appears otherwise normal without evidence other previous insult. Electronically Signed   By: Paulina FusiMark  Shogry M.D.   On: 11-03-20 14:23

## 2020-09-19 NOTE — Progress Notes (Signed)
IP PROGRESS NOTE  Subjective:   Dennis Howard is alert.  He requests that we not "touch" him.  Objective: Vital signs in last 24 hours: Blood pressure 133/86, pulse (!) 108, temperature 98.1 F (36.7 C), temperature source Axillary, resp. rate 15, height 6' (1.829 m), weight 162 lb 0.6 oz (73.5 kg), SpO2 99 %.  Intake/Output from previous day: 04/20 0701 - 04/21 0700 In: 1289 [I.V.:634; NG/GT:540; IV Piggyback:100] Out: 2300 [Urine:2300]  Physical Exam:  HEENT: Thrush and tongue ulcerations have improved.  Nasal feeding tube in place.  Extremities: Trace edema at the lower leg bilaterally, few "blister "lesions at the lower legs, light purplish discoloration at the great toe bilaterally Neurologic: Alert, follows simple commands, moves all extremities to command, oriented to place and year   Right IJ catheter site without evidence of infection or bleeding  Lab Results: Recent Labs    09/10/2020 0210 09/19/20 0554  WBC 35.2* 30.6*  HGB 7.9* 7.5*  HCT 24.6* 23.8*  PLT 73* 85*  ADAMTS13 on 09/17/2020 - 38.2%  BMET Recent Labs    09/01/2020 0210 09/19/20 0554  NA 146* 150*  K 4.1 3.8  CL 108 113*  CO2 30 31  GLUCOSE 127* 158*  BUN 80* 68*  CREATININE 1.77* 1.63*  CALCIUM 7.6* 7.7*    No results found for: CEA1  Studies/Results: CT CHEST WO CONTRAST  Result Date: 09/17/2020 CLINICAL DATA:  Pneumonia. EXAM: CT CHEST WITHOUT CONTRAST TECHNIQUE: Multidetector CT imaging of the chest was performed following the standard protocol without IV contrast. COMPARISON:  August 26, 2020. FINDINGS: Cardiovascular: No significant vascular findings. Normal heart size. No pericardial effusion. Mediastinum/Nodes: No enlarged mediastinal or axillary lymph nodes. Thyroid gland, trachea, and esophagus demonstrate no significant findings. There is noted soft tissue gas in the right supraclavicular region which may be related to insertion of right internal jugular catheter, but infection cannot  be excluded. Lungs/Pleura: No pneumothorax or pleural effusion is noted. Right lower lobe airspace opacity is noted concerning for pneumonia. Multiple ill-defined opacities are noted in the right upper lobe as well as in the left lower lobe as noted on prior exam, most consistent with multifocal pneumonia. Upper Abdomen: No acute abnormality. Musculoskeletal: No chest wall mass or suspicious bone lesions identified. IMPRESSION: Multiple opacities are noted in both lungs consistent with multifocal pneumonia. Largest opacity is seen in the right lower lobe, most consistent with pneumonia. Soft tissue gas is noted in the right supraclavicular region which most likely is related to insertion of right internal jugular catheter, but infection cannot be excluded. Electronically Signed   By: Lupita Raider M.D.   On: 09/17/2020 13:28   MR BRAIN WO CONTRAST  Result Date: 09/17/2020 CLINICAL DATA:  Neurological deficit. Stroke suspected. Altered mental status. EXAM: MRI HEAD WITHOUT CONTRAST TECHNIQUE: Multiplanar, multiecho pulse sequences of the brain and surrounding structures were obtained without intravenous contrast. COMPARISON:  Head CT 09/26/2020 FINDINGS: Brain: 5 mm acute infarction within the inferior cerebellum on the left. 6 mm acute infarction at the left parietooccipital junction. Subacute ischemic change of the cortical and subcortical brain at the left parietooccipital junction. 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 Abnormality affects the brainstem. Cerebral hemispheres otherwise appear normal without evidence underlying small-vessel disease. No mass, hemorrhage, hydrocephalus or extra-axial collection. Vascular: Major vessels at the base of the brain show flow. Skull and upper cervical spine: Negative Sinuses/Orbits: Retention cysts at the maxillary sinus floors. Otherwise negative. Orbits negative.  Other: None IMPRESSION: 1. 5 mm acute  infarction at the inferior cerebellum on the left. 2. 6 mm acute infarction at the left parietooccipital junction. 3. Subacute ischemic changes of the cortical and subcortical brain at the left parietooccipital junction. 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 4. Flow does appear to be present in the vertebrobasilar system. Consider MR angiography the head and neck or CT angiography of the head and neck. 5. The brain appears otherwise normal without evidence other previous insult. Electronically Signed   By: Paulina Fusi M.D.   On: 09/04/2020 14:23    Medications: I have reviewed the patient's current medications.  Assessment/Plan: 1.  Pulmonary infiltrates 2.  Chronic renal failure 3.  Cardiogenic shock on hospital admission 4.  Liver failure secondary to cardiogenic shock, underlying chronic liver disease? 5.  Anemia 6.  Thrombocytopenia 7.   Coagulopathy 8.   Altered mental status-hepatic encephalopathy? 9.   Rhabdomyolysis 10.  Oral candidiasis 11.  Muscle weakness-status post a muscle biopsy 09/07/2020 12.  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  The renal function, liver enzymes, and hyperbilirubinemia continue to improve.  Thrombocytopenia appears to be improving.  The hemoglobin has drifted down over the past few days, potentially secondary to phlebotomy and surgery.  The ADAMTS13 activity returned mildly decreased.  This is a nonspecific finding.  I do not think he has TTP.  He is undergoing evaluation for a neuromuscular disorder.  Steroids were started yesterday.  Recommendations:  1.  Continue supportive care per the critical care service. 2.  Daily CBC 3.  Follow-up muscle biopsy pathology 4.  Steroids as recommended by the critical care and neurology services 5.  Transfuse packed red cells for symptomatic anemia 6.  Stroke management per  neurology     LOS: 6 days   Thornton Papas, MD   09/19/2020, 7:55 AM

## 2020-09-19 NOTE — Progress Notes (Addendum)
Neurology Progress Note  S: His legs are not hurting as bad with the cream therapy. He is feeling pretty well. He is aware of many but not all of his multiple medical issues. Tell us he ran track in his younger years.   O: Current vital signs: BP (!) 152/88 (BP Location: Left Arm)   Pulse (!) 110   Temp 98.1 F (36.7 C) (Oral)   Resp 16   Ht 6' (1.829 m)   Wt 73.5 kg   SpO2 98%   BMI 21.98 kg/m  Vital signs in last 24 hours: Temp:  [97.2 F (36.2 C)-98.2 F (36.8 C)] 98.1 F (36.7 C) (04/21 0800) Pulse Rate:  [105-112] 110 (04/21 0830) Resp:  [0-18] 16 (04/21 0830) BP: (115-163)/(79-105) 152/88 (04/21 0830) SpO2:  [82 %-100 %] 98 % (04/21 0830) Weight:  [73.5 kg] 73.5 kg (04/21 0500)  GENERAL: Awake, alert in NAD HEENT: Normocephalic and atraumatic LUNGS: Normal respiratory effort.  CV: RRR on tele. 2+ edema to feet.   Ext: warm. Moves all extremities spontaneously. Blisters noted to LLE covered with Tegaderm.    NEURO:  Mental Status: Alert and oriented to self, place, month, and year.  Speech/Language: speech is without aphasia or dysarthria. Follows commands. Comprehension intact.  Cranial Nerves:  II: PERRL. Visual fields full.  III, IV, VI: EOMI. Eyelids elevate symmetrically.  VII: Smile is symmetrical. VIII: hearing intact to voice. IX, X: Palate elevates symmetrically. Phonation is normal.  IT:GPQDIYME shrug 5/5. XII: tongue is midline without fasciculations. Motor: 5/5 strength to bilateral grips, triceps and biceps. 4-/5 deltoids. Unable to lift legs off bed for thigh testing or knee testing (limited by pain as well), 1/5. Plantar flexion 4-/5, Dorsiflexion 4/5.  Sensation-allodynia to LEs, worse below the knee, much improved from prior DTRs: patellas 0, remain challenging at the ankle secondary to pain Gait- bedrest  Medications  Current Facility-Administered Medications:  .  capsaicin (ZOSTRIX) 0.025 % cream, , Topical, BID, Smitty Ackerley L, MD, 1 g  at 09/17/2020 2253 .  ceFEPIme (MAXIPIME) 2 g in sodium chloride 0.9 % 100 mL IVPB, 2 g, Intravenous, Q12H, Martina Sinner, MD, Stopped at 09/19/20 [phone] .  Chlorhexidine Gluconate Cloth 2 % PADS 6 each, 6 each, Topical, Daily, Martina Sinner, MD, 6 each at 09/19/20 0330 .  DOBUTamine (DOBUTREX) infusion 4000 mcg/mL, 6.5 mcg/kg/min, Intravenous, Titrated, Clegg, Amy D, NP, Last Rate: 7.17 mL/hr at 09/19/20 1000, 6.5 mcg/kg/min at 09/19/20 1000 .  docusate (COLACE) 50 MG/5ML liquid 100 mg, 100 mg, Per Tube, BID PRN, Kinsinger, De Blanch, MD .  feeding supplement (BOOST / RESOURCE BREEZE) liquid 1 Container, 1 Container, Oral, TID BM, Kinsinger, De Blanch, MD, 1 Container at 09/19/20 1020 .  feeding supplement (OSMOLITE 1.5 CAL) liquid 1,000 mL, 1,000 mL, Per Tube, Continuous, Martina Sinner, MD, Last Rate: 20 mL/hr at 09/05/2020 1404, 1,000 mL at 09/23/2020 1404 .  feeding supplement (PROSource TF) liquid 45 mL, 45 mL, Per Tube, TID, Martina Sinner, MD, 45 mL at 09/19/20 1022 .  fluconazole (DIFLUCAN) tablet 50 mg, 50 mg, Oral, Daily, Dewald, Jonathan B, MD, 50 mg at 09/19/20 1023 .  heparin ADULT infusion 100 units/mL (25000 units/227mL), 1,100 Units/hr, Intravenous, Continuous, Dewald, Jonathan B, MD, Last Rate: 11 mL/hr at 09/19/20 1048, 1,100 Units/hr at 09/19/20 1048 .  lidocaine (XYLOCAINE) 5 % ointment, , Topical, BID, Xiomara Sevillano L, MD, Given at 09/19/20 1024 .  magic mouthwash, 10 mL, Oral, TID, Kinsinger, De Blanch, MD,  10 mL at 09/19/20 1022 .  methylPREDNISolone sodium succinate (SOLU-MEDROL) 125 mg/2 mL injection 125 mg, 125 mg, Intravenous, Q6H, Martina Sinner, MD, 125 mg at 09/19/20 0841 .  multivitamin with minerals tablet 1 tablet, 1 tablet, Oral, Daily, Martina Sinner, MD, 1 tablet at 09/19/20 1022 .  oxyCODONE (Oxy IR/ROXICODONE) immediate release tablet 5 mg, 5 mg, Oral, Q4H PRN, Kinsinger, De Blanch, MD, 5 mg at Oct 04, 2020 1130 .  pantoprazole (PROTONIX)  injection 40 mg, 40 mg, Intravenous, Q12H, Melody Comas B, MD, 40 mg at 09/19/20 1023 .  sodium chloride flush (NS) 0.9 % injection 10-40 mL, 10-40 mL, Intracatheter, Q12H, Kinsinger, De Blanch, MD, 20 mL at 09/19/20 1023 .  sodium chloride flush (NS) 0.9 % injection 10-40 mL, 10-40 mL, Intracatheter, PRN, Kinsinger, De Blanch, MD .  thiamine (B-1) injection 100 mg, 100 mg, Intravenous, Daily, Angelo Prindle L, MD, 100 mg at 09/19/20 1022  Pertinent Labs  Na 150      Albumin 1.7     LDH 608     WBCC 30.6  (improving)  Hep panel non reactive Blood cultures NTD  Imaging MD has reviewed images in epic and the results pertinent to this consultation are:  MRI Brain  1. 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 2. 6 mm acute infarction at the left parietooccipital junction. 3. Subacute ischemic changes of the cortical and subcortical brain at the left parietooccipital junction. 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 4. Flow does appear to be present in the vertebrobasilar system. Consider MR angiography the head and neck or CT angiography of the head and neck. 5. The brain appears otherwise normal without evidence other previous insult.  Assessment: 55 yo male who presented to hospital 5 days ago with complaint of AMS. Baseline orientation is person, place, time, and event. Presenting mental status was confusion and somnolence. He has had a complicated hospitalization with LV thrombus, PE, AKI on CKD, PNA, Acute decompensation of CHF with an EF of 15%, cardiogenic shock, liver failure due to shock liver, and various metabolic derangements. Neurology was consulted on 09/17/20 for continued leg weakness. Thoughts at that time were differentials including Dermatomyositis, but he does not have typical rash associated with these. Increased inflammatory markers can also cause his symptoms. Muscle biopsy on 10-04-20 is  pending. We are also still awaiting [facility] myositis panel. MRI brain showed very small acute infarction at the inferior cerebellum on left. The 74mm acute infarction is likely remote due to appearance on MRI. There is always concern for hemorrhagic conversion of stroke with anticoagulation, but the benefits out weigh the risk given LV thrombus. Asked pharmacy to keep his goal with Heparin on low goal PTT protocol.   Impression: Stable weakness and improving pain control  Plan:  -Steroids per primary team -Given minor strokes on MRI brain, low goal no bolus heparin drip is appropriate for continued anticoagulation of his LV thrombus -Continue lidocaine and capsaicin creams as ordered\ -Recommend MRA brain and carotid ultrasound to formally complete stroke workup when central neck line can be removed, non-urgent.   - Vascular neurology referral if hemodynamically significant carotid stenosis on radiology read -Please reach out to neurology when muscle biopsy results return -No further neurological work-up at this time, neurology will be available on an as-needed basis  Pt seen by Jimmye Norman, MSN, APN-BC/Nurse Practitioner/Neuro and later by MD. Note and plan to be edited as needed  by MD.  Pager: [phone]  Attending Neurologist's note:  I personally saw this patient, gathering history, performing a full neurologic examination, reviewing relevant labs, personally reviewing relevant imaging including MRI brain, and formulated the assessment and plan, adding the note above for completeness and clarity to accurately reflect my thoughts   Brooke Dare MD-PhD [facility] [phone] Available 7 AM to 7 PM, outside these hours please contact Neurologist on call listed on AMION

## 2020-09-20 ENCOUNTER — Inpatient Hospital Stay (HOSPITAL_COMMUNITY): Payer: BLUE CROSS/BLUE SHIELD

## 2020-09-20 DIAGNOSIS — N179 Acute kidney failure, unspecified: Secondary | ICD-10-CM | POA: Diagnosis not present

## 2020-09-20 DIAGNOSIS — R570 Cardiogenic shock: Secondary | ICD-10-CM | POA: Diagnosis not present

## 2020-09-20 DIAGNOSIS — R918 Other nonspecific abnormal finding of lung field: Secondary | ICD-10-CM | POA: Diagnosis not present

## 2020-09-20 DIAGNOSIS — N189 Chronic kidney disease, unspecified: Secondary | ICD-10-CM | POA: Diagnosis not present

## 2020-09-20 DIAGNOSIS — D649 Anemia, unspecified: Secondary | ICD-10-CM | POA: Diagnosis not present

## 2020-09-20 DIAGNOSIS — D696 Thrombocytopenia, unspecified: Secondary | ICD-10-CM | POA: Diagnosis not present

## 2020-09-20 LAB — DIFFERENTIAL
Abs Immature Granulocytes: 0.53 10*3/uL — ABNORMAL HIGH (ref 0.00–0.07)
Basophils Absolute: 0.1 10*3/uL (ref 0.0–0.1)
Basophils Relative: 0 %
Eosinophils Absolute: 0 10*3/uL (ref 0.0–0.5)
Eosinophils Relative: 0 %
Immature Granulocytes: 1 %
Lymphocytes Relative: 3 %
Lymphs Abs: 1.4 10*3/uL (ref 0.7–4.0)
Monocytes Absolute: 2.2 10*3/uL — ABNORMAL HIGH (ref 0.1–1.0)
Monocytes Relative: 5 %
Neutro Abs: 37.8 10*3/uL — ABNORMAL HIGH (ref 1.7–7.7)
Neutrophils Relative %: 91 %

## 2020-09-20 LAB — RETICULOCYTES
Immature Retic Fract: 42.3 % — ABNORMAL HIGH (ref 2.3–15.9)
RBC.: 2.26 MIL/uL — ABNORMAL LOW (ref 4.22–5.81)
Retic Count, Absolute: 90.2 10*3/uL (ref 19.0–186.0)
Retic Ct Pct: 4 % — ABNORMAL HIGH (ref 0.4–3.1)

## 2020-09-20 LAB — COMPREHENSIVE METABOLIC PANEL
ALT: 264 U/L — ABNORMAL HIGH (ref 0–44)
AST: 193 U/L — ABNORMAL HIGH (ref 15–41)
Albumin: 1.4 g/dL — ABNORMAL LOW (ref 3.5–5.0)
Alkaline Phosphatase: 206 U/L — ABNORMAL HIGH (ref 38–126)
Anion gap: 6 (ref 5–15)
BUN: 90 mg/dL — ABNORMAL HIGH (ref 6–20)
CO2: 28 mmol/L (ref 22–32)
Calcium: 7.2 mg/dL — ABNORMAL LOW (ref 8.9–10.3)
Chloride: 116 mmol/L — ABNORMAL HIGH (ref 98–111)
Creatinine, Ser: 2.31 mg/dL — ABNORMAL HIGH (ref 0.61–1.24)
GFR, Estimated: 33 mL/min — ABNORMAL LOW (ref >60)
Glucose, Bld: 258 mg/dL — ABNORMAL HIGH (ref 70–99)
Potassium: 4.2 mmol/L (ref 3.5–5.1)
Sodium: 150 mmol/L — ABNORMAL HIGH (ref 135–145)
Total Bilirubin: 1.6 mg/dL — ABNORMAL HIGH (ref 0.3–1.2)
Total Protein: 4 g/dL — ABNORMAL LOW (ref 6.5–8.1)

## 2020-09-20 LAB — CBC
HCT: 21.7 % — ABNORMAL LOW (ref 39.0–52.0)
Hemoglobin: 7 g/dL — ABNORMAL LOW (ref 13.0–17.0)
MCH: 30.7 pg (ref 26.0–34.0)
MCHC: 32.3 g/dL (ref 30.0–36.0)
MCV: 95.2 fL (ref 80.0–100.0)
Platelets: 77 10*3/uL — ABNORMAL LOW (ref 150–400)
RBC: 2.28 MIL/uL — ABNORMAL LOW (ref 4.22–5.81)
RDW: 17 % — ABNORMAL HIGH (ref 11.5–15.5)
WBC: 40.9 10*3/uL — ABNORMAL HIGH (ref 4.0–10.5)
nRBC: 1.4 % — ABNORMAL HIGH (ref 0.0–0.2)

## 2020-09-20 LAB — GLUCOSE, CAPILLARY
Glucose-Capillary: 243 mg/dL — ABNORMAL HIGH (ref 70–99)
Glucose-Capillary: 226 mg/dL — ABNORMAL HIGH (ref 70–99)
Glucose-Capillary: 179 mg/dL — ABNORMAL HIGH (ref 70–99)
Glucose-Capillary: 158 mg/dL — ABNORMAL HIGH (ref 70–99)
Glucose-Capillary: 231 mg/dL — ABNORMAL HIGH (ref 70–99)
Glucose-Capillary: 286 mg/dL — ABNORMAL HIGH (ref 70–99)

## 2020-09-20 LAB — PHOSPHORUS: Phosphorus: 3.1 mg/dL (ref 2.5–4.6)

## 2020-09-20 LAB — SAVE SMEAR(SSMR), FOR PROVIDER SLIDE REVIEW

## 2020-09-20 LAB — MISC LABCORP TEST (SEND OUT)

## 2020-09-20 LAB — OCCULT BLOOD X 1 CARD TO LAB, STOOL: Fecal Occult Bld: POSITIVE — AB

## 2020-09-20 LAB — MAGNESIUM: Magnesium: 2.7 mg/dL — ABNORMAL HIGH (ref 1.7–2.4)

## 2020-09-20 LAB — LACTATE DEHYDROGENASE: LDH: 594 U/L — ABNORMAL HIGH (ref 98–192)

## 2020-09-20 LAB — DIRECT ANTIGLOBULIN TEST (NOT AT ARMC)
DAT, IgG: NEGATIVE
DAT, complement: NEGATIVE

## 2020-09-20 LAB — PREPARE RBC (CROSSMATCH)

## 2020-09-20 LAB — HEMOGLOBIN AND HEMATOCRIT, BLOOD
HCT: 28.8 % — ABNORMAL LOW (ref 39.0–52.0)
Hemoglobin: 9.6 g/dL — ABNORMAL LOW (ref 13.0–17.0)

## 2020-09-20 LAB — HEPARIN LEVEL (UNFRACTIONATED)
Heparin Unfractionated: <0.1 IU/mL — ABNORMAL LOW (ref 0.30–0.70)
Heparin Unfractionated: 0.31 IU/mL (ref 0.30–0.70)

## 2020-09-20 MED ORDER — SODIUM CHLORIDE 0.9% IV SOLUTION: Freq: Once | INTRAVENOUS | Status: AC

## 2020-09-20 MED ORDER — PANTOPRAZOLE SODIUM 40 MG IV SOLR
40.0000 mg | Freq: Two times a day (BID) | INTRAVENOUS | Status: DC
  Administered 2020-09-23 – 2020-10-02 (×19): 40 mg via INTRAVENOUS
  Filled 2020-09-20 (×18): qty 40

## 2020-09-20 MED ORDER — INSULIN DETEMIR 100 UNIT/ML ~~LOC~~ SOLN
10.0000 [IU] | Freq: Two times a day (BID) | SUBCUTANEOUS | Status: DC
  Administered 2020-09-20 – 2020-09-22 (×6): 10 [IU] via SUBCUTANEOUS
  Filled 2020-09-20 (×8): qty 0.1

## 2020-09-20 MED ORDER — SODIUM CHLORIDE 0.9 % IV SOLN
8.0000 mg/h | INTRAVENOUS | Status: AC
  Administered 2020-09-20 – 2020-09-22 (×8): 8 mg/h via INTRAVENOUS
  Filled 2020-09-20 (×7): qty 80
  Filled 2020-09-20: qty 0
  Filled 2020-09-20: qty 80

## 2020-09-20 MED ORDER — HEPARIN (PORCINE) 25000 UT/250ML-% IV SOLN
900.0000 [IU]/h | INTRAVENOUS | Status: DC
  Administered 2020-09-21 – 2020-09-22 (×2): 1000 [IU]/h via INTRAVENOUS
  Administered 2020-09-23: 850 [IU]/h via INTRAVENOUS
  Filled 2020-09-20 (×3): qty 250

## 2020-09-20 MED ORDER — SODIUM CHLORIDE 0.9 % IV SOLN
80.0000 mg | Freq: Once | INTRAVENOUS | Status: AC
  Administered 2020-09-20: 80 mg via INTRAVENOUS
  Filled 2020-09-20: qty 80

## 2020-09-20 NOTE — Progress Notes (Addendum)
Inpatient Diabetes Program Recommendations  AACE/ADA: New Consensus Statement on Inpatient Glycemic Control (2015)  Target Ranges:  Prepandial:   less than 140 mg/dL      Peak postprandial:   less than 180 mg/dL (1-2 hours)      Critically ill patients:  140 - 180 mg/dL   Lab Results  Component Value Date   GLUCAP 226 (H) 09/20/2020   HGBA1C 5.7 (H) 09/19/2020    Review of Glycemic Control Results for Dennis Howard, Dennis Howard (MRN [id number]) as of 09/20/2020 09:11  Ref. Range 09/19/2020 07:54 09/19/2020 11:38 09/19/2020 15:16 09/19/2020 19:02 09/19/2020 19:29 09/19/2020 23:55 09/20/2020 03:44 09/20/2020 07:29  Glucose-Capillary Latest Ref Range: 70 - 99 mg/dL 655 (H) 374 (H) 827 (H)  Novolog 29 units 233 (H) 220 (H)  Novolog 11 units 242 (H)  Novolog 11 units 243 (H)  Novolog 11 units 226 (H)  Novolog 15 units   Diabetes history: No  Current orders for Inpatient glycemic control:  Novolog 0-20 units Q4 Novolog 4 units Q4 hours Tube Feed Coverage  BUN/Creat: 83/2.04 Solumedrol 125 mg Q6 hours Osmolite 60 ml/hour  Inpatient Diabetes Program Recommendations:    If steroid dose remains the same:  -  Consider Levemir 8 units bid -  Increase Novolog Tube Feed Coverage to 6 units Q4 hours  Thanks,  Christena Deem RN, MSN, BC-ADM Inpatient Diabetes Coordinator Team Pager [phone] (8a-5p)

## 2020-09-20 NOTE — Progress Notes (Signed)
Called about fluid collection noted on CT scan. Seen with my attending. This appears to be a post-operative hematoma. No indication for drainage.

## 2020-09-20 NOTE — Progress Notes (Signed)
Advanced Heart Failure Rounding Note  PCP-Cardiologist: No primary care provider on file.    Patient Profile   55 y/o male w/ no prior cardiac history, admitted for mixed cardiogenic + septic shock in setting of PNA + hepatic encephalopathy.   ECHO LVEF 15% with severe global HK with regional variability + LV clot RV moderately down. Initial Co-ox 48%. Started on DBA.    Subjective:    Remains on DBA 6.5 mcg/kg/min  Lethargic but will follow commands. Complains of pain in leg.   Had dark stool overnight. Also developed thigh hematoma. Hgb dropped to 7.0. Heparin stopped. Now getting RBCs.   Creatinine trending back up. 1.6->2.0 -> 2.3 Diuretics on hold  Results of muscle biopsy still pending. Remains on steroids without appreciable change in mental status or strength.   Bili and LDH trending down. Inflammatory serologies unimpressive  Objective:   Weight Range: 76.4 kg Body mass index is 22.84 kg/m.   Vital Signs:   Temp:  [97.6 F (36.4 C)-98.8 F (37.1 C)] 98 F (36.7 C) (04/22 1223) Pulse Rate:  [105-217] 115 (04/22 1200) Resp:  [5-32] 21 (04/22 1200) BP: (82-144)/(42-100) 141/95 (04/22 1200) SpO2:  [56 %-100 %] 100 % (04/22 1200) Weight:  [76.4 kg] 76.4 kg (04/22 0357) Last BM Date: 09/14/20  Weight change: Filed Weights   08/30/2020 0144 09/19/20 0500 09/20/20 0357  Weight: 77.4 kg 73.5 kg 76.4 kg    Intake/Output:   Intake/Output Summary (Last 24 hours) at 09/20/2020 1324 Last data filed at 09/20/2020 1200 Gross per 24 hour  Intake 4732.67 ml  Output 800 ml  Net 3932.67 ml      Physical Exam   General:  Weak appearing. No resp difficulty HEENT: normal + cor-trak Neck: supple. RIJ TLC  Carotids 2+ bilat; no bruits. No lymphadenopathy or thryomegaly appreciated. Cor: PMI nondisplaced. Regular tachy Lungs: clear Abdomen: soft, nontender, nondistended. No hepatosplenomegaly. No bruits or masses. Good bowel sounds. Extremities: no cyanosis,  clubbing, rash, 2+ edema + left thigh hematoma Neuro: awake will follow commands  Weak.   Telemetry   Sinus 110-120 Personally reviewed   Labs    CBC Recent Labs    09/19/20 1648 09/20/20 0406  WBC 31.6* 40.9*  NEUTROABS  --  37.8*  HGB 5.3* 7.0*  HCT 17.3* 21.7*  MCV 98.3 95.2  PLT 74* 77*   Basic Metabolic Panel Recent Labs    93/23/55 1648 09/20/20 0406  NA 149* 150*  K 3.3* 4.2  CL 113* 116*  CO2 24 28  GLUCOSE 332* 258*  BUN 83* 90*  CREATININE 2.04* 2.31*  CALCIUM 7.3* 7.2*  MG 2.7* 2.7*  PHOS 3.2 3.1   Liver Function Tests Recent Labs    09/19/20 1648 09/20/20 0406  AST 230* 193*  ALT 276* 264*  ALKPHOS 211* 206*  BILITOT 1.9* 1.6*  PROT 4.0* 4.0*  ALBUMIN 1.3* 1.4*   No results for input(s): LIPASE, AMYLASE in the last 72 hours. Cardiac Enzymes No results for input(s): CKTOTAL, CKMB, CKMBINDEX, TROPONINI in the last 72 hours.  BNP: BNP (last 3 results) Recent Labs    09/28/2020 2114 09/14/20 0031 09/15/20 0321  BNP 1,447.9* 1,523.7* 743.8*    ProBNP (last 3 results) No results for input(s): PROBNP in the last 8760 hours.   D-Dimer No results for input(s): DDIMER in the last 72 hours. Hemoglobin A1C Recent Labs    09/19/20 0554  HGBA1C 5.7*   Fasting Lipid Panel No results for input(s): CHOL,  HDL, LDLCALC, TRIG, CHOLHDL, LDLDIRECT in the last 72 hours. Thyroid Function Tests No results for input(s): TSH, T4TOTAL, T3FREE, THYROIDAB in the last 72 hours.  Invalid input(s): FREET3  Other results:   Imaging    CT HEAD WO CONTRAST  Result Date: 09/19/2020 CLINICAL DATA:  Altered mental status EXAM: CT HEAD WITHOUT CONTRAST TECHNIQUE: Contiguous axial images were obtained from the base of the skull through the vertex without intravenous contrast. COMPARISON:  09/22/2020, MRI 09/03/2020 FINDINGS: Brain: The previously seen small acute and subacute infarcts by MRI not visible by CT. No new areas of infarction seen. No hemorrhage  or hydrocephalus. Vascular: No hyperdense vessel or unexpected calcification. Skull: No acute calvarial abnormality. Sinuses/Orbits: No acute findings Other: None IMPRESSION: Previously seen small left infarcts by MRI not visible by CT. No acute intracranial abnormality. Electronically Signed   By: Charlett Nose M.D.   On: 09/19/2020 18:41   CT FEMUR LEFT WO CONTRAST  Result Date: 09/20/2020 CLINICAL DATA:  Patient is status post muscle biopsy from the right thigh 09/15/2020. Pain and swelling. EXAM: CT OF THE LOWER LEFT EXTREMITY WITHOUT CONTRAST TECHNIQUE: Multidetector CT imaging of the lower left extremity was performed according to the standard protocol. COMPARISON:  None. FINDINGS: Bones/Joint/Cartilage Imaged bones appear normal without fracture, stress change or worrisome lesion. Ligaments Suboptimally assessed by CT. Muscles and Tendons There is a fluid collection in the mid rectus femoris which measures 5.5 cm craniocaudal by 6.1 cm transverse by 3.1 cm AP. Hounsfield unit measurements within the collection are 19.7. There is a small amount of gas within the collection and a small amount of gas within adjacent fascial planes. A fascial calcification in the medial compartment is likely due to some prior injury or inflammatory process. Soft tissues There is stranding in subcutaneous tissues throughout the thigh. No focal fluid collection. Partially visualized right thigh demonstrates similar appearing stranding in subcutaneous. IMPRESSION: Fluid collection at the site of biopsy has an appearance most compatible with a postoperative seroma or possibly an abscess. There is a small amount of gas about the biopsy site consistent with recent surgery. Stranding in subcutaneous fatty tissues of the left thigh throughout could be due to dependent change or cellulitis. Dependent change is favored given partial visualization of stranding in subcutaneous fat of the right thigh. Electronically Signed   By: Drusilla Kanner M.D.   On: 09/20/2020 12:15     Medications:     Scheduled Medications: . sodium chloride   Intravenous Once  . capsaicin   Topical BID  . Chlorhexidine Gluconate Cloth  6 each Topical Daily  . feeding supplement  1 Container Oral TID BM  . feeding supplement (PROSource TF)  45 mL Per Tube TID  . insulin aspart  0-20 Units Subcutaneous Q4H  . insulin aspart  4 Units Subcutaneous Q4H  . insulin detemir  10 Units Subcutaneous BID  . lidocaine   Topical BID  . magic mouthwash  10 mL Oral TID  . methylPREDNISolone (SOLU-MEDROL) injection  125 mg Intravenous Q6H  . multivitamin with minerals  1 tablet Oral Daily  . [START ON 09/23/2020] pantoprazole  40 mg Intravenous Q12H  . sodium chloride flush  10-40 mL Intracatheter Q12H  . thiamine injection  100 mg Intravenous Daily    Infusions: . ceFEPime (MAXIPIME) IV Stopped (09/20/20 0612)  . DOBUTamine 6.5 mcg/kg/min (09/20/20 1200)  . feeding supplement (OSMOLITE 1.5 CAL) 60 mL/hr at 09/19/20 2000  . pantoprozole (PROTONIX) infusion 8 mg/hr (09/20/20 1200)  PRN Medications: docusate, oxyCODONE, sodium chloride flush    Patient Profile   55 y/o male w/ no prior cardiac history, admitted for mixed cardiogenic + septic shock in setting of PNA + hepatic encephalopathy.   ECHO LVEF 15% with severe global HK with regional variability + LV clot RV moderately down. Initial Co-ox 48%. Started on DBA.    Assessment/Plan   1. Cardiogenic shock with multisystem organ failure - ECHO LVEF 15% with severe global HK with regional variability + LV clot RV moderately down. - Remains on DBA 6.5. Continue for now - Diuretics on hold with hypernatremia, low CVP and AKI. Continue to hold for now   - Unifying diagnosis remains unclear. Serologies unrevealing so far. Await result of muscle biopsy.  - will need R/L cath if/when he recovers  2. LV thrombus - heparin gtt on hold with acute bleed  3. Shock liver with hepatic  encephalopathy - Suspect due to low output HF. Transaminases improving with hemodynamics support - Liver u/s suggestive of hepatic steatosis but not long-term cirrhosis. Though low albumin and low PLTs suggestive of more longstanding liver process  - continue supportive care  4. AKI due to ATN/shock - Baseline creatinine unknown  - SCr 1.5 in 2016 in Care Everywhere - Creatinine climbing. Hold diuretics  5. CAP - PCT 3.70 -> 2.9 - BC NGTD  - was on cefepime, now on Ancef   6. Rhabdomyolysis/myositis with hematologic abnormalties - improving, CK down trending  - muscle biopsy pending  - suspect underlying inflammatory process but serologies negative so far - Await results of muscle biopsy - On steroids. No clear benefit so far  7. ABLA - likely due to left thigh hematoma +/- GI bleed - tranfuse - PPI - CT thigh  - hold heparin for now  9. Hypernatremia  - Sodium 150. Holding lasix   AHF team will see again Monday. Please call over the weekend with questions.   CRITICAL CARE Performed by: Arvilla Meres  Total critical care time: 35 minutes  Critical care time was exclusive of separately billable procedures and treating other patients.  Critical care was necessary to treat or prevent imminent or life-threatening deterioration.  Critical care was time spent personally by me (independent of midlevel providers or residents) on the following activities: development of treatment plan with patient and/or surrogate as well as nursing, discussions with consultants, evaluation of patient's response to treatment, examination of patient, obtaining history from patient or surrogate, ordering and performing treatments and interventions, ordering and review of laboratory studies, ordering and review of radiographic studies, pulse oximetry and re-evaluation of patient's condition.    Length of Stay: 7  Arvilla Meres, MD  09/20/2020, 1:24 PM  Advanced Heart Failure  Team Pager [phone] (M-F; 7a - 5p)  Please contact [facility] for night-coverage after hours (5p -7a ) and weekends on [email or web address]

## 2020-09-20 NOTE — Progress Notes (Addendum)
ANTICOAGULATION CONSULT NOTE - Follow Up Consult  Pharmacy Consult for IV Heparin Indication: LV mural thrombus  Heparin Dosing Weight:  77.4 kg  Labs: Recent Labs    09/19/20 0118 09/19/20 0554 09/19/20 1031 09/19/20 1648 09/20/20 0406  HGB  --  7.5*  --  5.3* 7.0*  HCT  --  23.8*  --  17.3* 21.7*  PLT  --  85*  --  74* 77*  HEPARINUNFRC 0.41  --  0.51  --  <0.10*  CREATININE  --  1.63*  --  2.04* 2.31*   Assessment: 55 yr old man presented with AMS. Pharmacy was consulted for heparin for NSTEMI; pt was on no anticoagulants PTA. H/H ok on admission, plt low at 121.  Held heparin on 4/18, patient now S/P muscle biopsy. Per Dr. Sheliah Hatch, resumed heparin at 1000 4/20.  MRI brain on 4/20 with multiple acute infarcts and subacute ischemic changes. Per Neurology, will target low end of goal range  On 4/21 in PM patient had large black/dark stool, FOBT+. Hgb 5.3 and transfused 2 units PRBC. Hgb now up to 7.0. Platelets low but stable, today 77. CCM held heparin since 4/22 0025. Patient with L thigh hematoma, per surgery CT scan indicative of post-op hematoma, no indication for drainage. Per CCM, reintroduce heparin cautiously. Per RN no other s/sx bleeding, no further bowel movements.  Goal of Therapy:  Heparin level 0.3-0.5 units/ml Monitor platelets by anticoagulation protocol: Yes   Plan:  Restart heparin infusion at 1000 units/hr (no boluses) Heparin level in 6 hours Monitor daily heparin level, CBC, platelets Monitor for signs/symptoms of bleeding   Laverna Peace, PharmD PGY-1 Pharmacy Resident 09/20/2020 3:56 PM Please see AMION for all pharmacy numbers

## 2020-09-20 NOTE — Progress Notes (Signed)
Advanced Heart Failure Rounding Note  PCP-Cardiologist: No primary care provider on file.    Patient Profile   55 y/o male w/ no prior cardiac history, admitted for mixed cardiogenic + septic shock in setting of PNA + hepatic encephalopathy.   ECHO LVEF 15% with severe global HK with regional variability + LV clot RV moderately down. Initial Co-ox 48%. Started on DBA.    Subjective:    Remains DBA 7.5 CO-OX 60%.   Diuresing with IV lasix + metolazone. Negative 1 liter.   Sodium 150.    Lactic acid trending down, 2.9>>2.2  Creatinine trending down 2>1.8>1.6   Concern for autoimmune/ inflammatory process. CRP and ESR both elevated, 5.7 and 17 respectively. Muscle biopsy pending.    MRI Brain-1. 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 2. 6 mm acute infarction at the left parietooccipital junction. 3. Subacute ischemic changes of the cortical and subcortical brain at the left parietooccipital junction. 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 4. Flow does appear to be present in the vertebrobasilar system. Consider MR angiography the head and neck or CT angiography of the head and neck. 5. The brain appears otherwise normal without evidence other previous insult  WBC 34>>35>>30.6 K Hgb 8.0>>7.9>>7.5  Plt 71>>73>>85    Hematology following.   Complaining pain in his feet.    Objective:   Weight Range: 76.4 kg Body mass index is 22.84 kg/m.   Vital Signs:   Temp:  [97.6 F (36.4 C)-98.8 F (37.1 C)] 98 F (36.7 C) (04/22 0731) Pulse Rate:  [105-217] 114 (04/22 0900) Resp:  [5-27] 17 (04/22 0900) BP: (82-144)/(40-94) 130/85 (04/22 0900) SpO2:  [56 %-100 %] 100 % (04/22 0900) Weight:  [76.4 kg] 76.4 kg (04/22 0357) Last BM Date: 09/14/20  Weight change: Filed Weights   09/10/2020 0144 09/19/20 0500 09/20/20 0357  Weight: 77.4 kg 73.5 kg 76.4 kg     Intake/Output:   Intake/Output Summary (Last 24 hours) at 09/20/2020 0951 Last data filed at 09/20/2020 0600 Gross per 24 hour  Intake 4599.81 ml  Output 1100 ml  Net 3499.81 ml      Physical Exam  CVP 1  General:  No resp difficulty.Appears weak  HEENT: Cortrak  Neck: supple. no JVD. Carotids 2+ bilat; no bruits. No lymphadenopathy or thryomegaly appreciated. RIJ  Cor: PMI nondisplaced. Tachy regular rate & rhythm. No rubs, gallops or murmurs. Lungs: clear Abdomen: soft, nontender, nondistended. No hepatosplenomegaly. No bruits or masses. Good bowel sounds. Extremities: no cyanosis, clubbing, rash, R and LLE dressing on lower extremities.  Neuro: alert & orientedx3, cranial nerves grossly intact. moves all 4 extremities w/o difficulty. Affect flat   Telemetry  ST 100-110s   EKG    No new ekg to review  Labs    CBC Recent Labs    09/19/20 1648 09/20/20 0406  WBC 31.6* 40.9*  HGB 5.3* 7.0*  HCT 17.3* 21.7*  MCV 98.3 95.2  PLT 74* 77*   Basic Metabolic Panel Recent Labs    09/19/20 1648 09/20/20 0406  NA 149* 150*  K 3.3* 4.2  CL 113* 116*  CO2 24 28  GLUCOSE 332* 258*  BUN 83* 90*  CREATININE 2.04* 2.31*  CALCIUM 7.3* 7.2*  MG 2.7* 2.7*  PHOS 3.2 3.1   Liver Function Tests Recent Labs    09/19/20 1648 09/20/20 0406  AST 230* 193*  ALT 276* 264*  ALKPHOS 211* 206*  BILITOT 1.9* 1.6*  PROT 4.0* 4.0*  ALBUMIN 1.3* 1.4*   No results for input(s): LIPASE, AMYLASE in the last 72 hours. Cardiac Enzymes No results for input(s): CKTOTAL, CKMB, CKMBINDEX, TROPONINI in the last 72 hours.  BNP: BNP (last 3 results) Recent Labs    09/23/2020 2114 09/14/20 0031 09/15/20 0321  BNP 1,447.9* 1,523.7* 743.8*    ProBNP (last 3 results) No results for input(s): PROBNP in the last 8760 hours.   D-Dimer No results for input(s): DDIMER in the last 72 hours. Hemoglobin A1C Recent Labs    09/19/20 0554  HGBA1C 5.7*   Fasting Lipid Panel No  results for input(s): CHOL, HDL, LDLCALC, TRIG, CHOLHDL, LDLDIRECT in the last 72 hours. Thyroid Function Tests No results for input(s): TSH, T4TOTAL, T3FREE, THYROIDAB in the last 72 hours.  Invalid input(s): FREET3  Other results:   Imaging    CT HEAD WO CONTRAST  Result Date: 09/19/2020 CLINICAL DATA:  Altered mental status EXAM: CT HEAD WITHOUT CONTRAST TECHNIQUE: Contiguous axial images were obtained from the base of the skull through the vertex without intravenous contrast. COMPARISON:  09/10/2020, MRI 09/14/2020 FINDINGS: Brain: The previously seen small acute and subacute infarcts by MRI not visible by CT. No new areas of infarction seen. No hemorrhage or hydrocephalus. Vascular: No hyperdense vessel or unexpected calcification. Skull: No acute calvarial abnormality. Sinuses/Orbits: No acute findings Other: None IMPRESSION: Previously seen small left infarcts by MRI not visible by CT. No acute intracranial abnormality. Electronically Signed   By: Charlett Nose M.D.   On: 09/19/2020 18:41     Medications:     Scheduled Medications:  capsaicin   Topical BID   Chlorhexidine Gluconate Cloth  6 each Topical Daily   feeding supplement  1 Container Oral TID BM   feeding supplement (PROSource TF)  45 mL Per Tube TID   fluconazole  50 mg Oral Daily   insulin aspart  0-20 Units Subcutaneous Q4H   insulin aspart  4 Units Subcutaneous Q4H   lidocaine   Topical BID   magic mouthwash  10 mL Oral TID   methylPREDNISolone (SOLU-MEDROL) injection  125 mg Intravenous Q6H   multivitamin with minerals  1 tablet Oral Daily   [START ON 09/23/2020] pantoprazole  40 mg Intravenous Q12H   sodium chloride flush  10-40 mL Intracatheter Q12H   thiamine injection  100 mg Intravenous Daily    Infusions:  ceFEPime (MAXIPIME) IV 200 mL/hr at 09/20/20 0600   DOBUTamine 6.5 mcg/kg/min (09/20/20 0600)   feeding supplement (OSMOLITE 1.5 CAL) 60 mL/hr at 09/19/20 2000   heparin Stopped (09/20/20 0025)    pantoprozole (PROTONIX) infusion 8 mg/hr (09/20/20 0849)    PRN Medications: docusate, oxyCODONE, sodium chloride flush    Patient Profile   55 y/o male w/ no prior cardiac history, admitted for mixed cardiogenic + septic shock in setting of PNA + hepatic encephalopathy.   ECHO LVEF 15% with severe global HK with regional variability + LV clot RV moderately down. Initial Co-ox 48%. Started on DBA.    Assessment/Plan   1. Cardiogenic shock with multisystem organ failure - ECHO LVEF 15% with severe global HK with regional variability + LV clot RV moderately down. - suspect this is primary issue with MSOF as a result of low output stated and decreased mental status - CO-OX 60%. Cut back DBA 6.5 mcg.  - Sodium 150. CVP 1. Hold lasix and stop metolazone.  - will need R/L cath as he recovers  2. LV thrombus - On heparin drip.    3. Shock liver with hepatic encephalopathy - due to low output HF. Transaminases improving with hemodynamics support - Liver u/s suggestive of hepatic steatosis but not long-term cirrhosis. Though low albumin and low PLTs suggestive of more longstanding liver process  - continue supportive care   4. AKI due to ATN/shock - Baseline creatinine unknown  - SCr 1.5 in 2016 in Care Everywhere - Creatinine 2.1 in 3/22 -> 1.99->1.88->1.97->1.77 >1.6   5. CAP - PCT 3.70 -> 2.9 - BC NGTD  - was on cefepime, now on Ancef    6. Rhabdomyolysis - improving, CK down trending  - muscle biopsy pending    7. Hyperkalemia - resolved   8. Acute Anemia - Unexplained drop in Hgb from 15 on admit>> 8.4.  - CT of A/P negative for RPB  - transfused 1 unit 4/18, Hgb 8.4>>8.7>8.0>>7.9 today  - WBC 35K, Plt 73K  - Associated leukocytosis + thrombocytopenia raises concern for acute hematologic process ? Leukemia. Blood smear pending. CT of chest w/o signs of malignancy  ? Myositis. Started on steroids.  - Muscle biopsy results pending.  - hematology following, Concern  for autoimmune/ inflammatory process.    9. Hypernatremia  Sodium 150. Hold lasix.   Length of Stay: [place], MD  09/20/2020, 9:51 AM  Advanced Heart Failure Team Pager [phone] (M-F; 7a - 5p)  Please contact [facility] for night-coverage after hours (5p -7a ) and weekends on [email or web address]  Agree with above.  Remains very weak. Feet hurt. Remains on DBA 5 and IV lasix. Weight down 20 pounds. Co-ox 60%.  Now on pulse steroids. Hgb dropping. Na 150  General:  Weak appearing. LethargicNo resp difficulty HEENT: normal Neck: supple. no JVD. Carotids 2+ bilat; no bruits. No lymphadenopathy or thryomegaly appreciated. Cor: PMI nondisplaced. Regular rate & rhythm. No rubs, gallops or murmurs. Lungs: clear Abdomen: soft, nontender, nondistended. No hepatosplenomegaly. No bruits or masses. Good bowel sounds. Extremities: no cyanosis, clubbing, rash, tr-1+ edema + UNNA Neuro: lethargic but communicative. Diffusely weak   Unifying diagnosis remains unclear. Serologies unrevealing so far. Await result of muscle biopsy.   Continue DBA. Stop lasix with hypernatremia. Will need FW or IVF.   Continue pulse steroids for now. Await response.   CRITICAL CARE Performed by: Glori Bickers  Total critical care time: 35 minutes  Critical care time was exclusive of separately billable procedures and treating other patients.  Critical care was necessary to treat or prevent imminent or life-threatening deterioration.  Critical care was time spent personally by me (independent of midlevel providers or residents) on the following activities: development of treatment plan with patient and/or surrogate as well as nursing, discussions with consultants, evaluation of patient's response to treatment, examination of patient, obtaining history from patient or surrogate, ordering and performing treatments and interventions, ordering and review of laboratory studies, ordering and review of  radiographic studies, pulse oximetry and re-evaluation of patient's condition.  Glori Bickers, MD  9:51 AM

## 2020-09-20 NOTE — Progress Notes (Signed)
NAME:  Dennis Howard., MRN:  [id number], DOB:  1966/02/16, LOS: 7 ADMISSION DATE:  09/10/2020, CONSULTATION DATE: 09/24/2020 REFERRING MD: Wilkie Aye - EM CHIEF COMPLAINT:  Altered Mental Status  History of Present Illness:  55 y/o man with a past medical history of chronic kidney disease, hypertension who presented to [facility] on 4/15 with CC AMS.  Symptoms started a week prior to admit.  His baseline mental status is alert and oriented to person place time and event.  Mental status changes were described as somnolent and decreased responsiveness.  He has associated lower extremity swelling, weight gain, shortness of breath, and cough. No associated chest pain, fever, syncope. He has never had these symptoms before.  He was recently seen at [facility] emergency department on 08/18/2020 for cough and shortness of breath.  He was diagnosed with pneumonia and discharged home with doxycycline.  He returned to [facility] 4/15 with altered mental status and lower extremity swelling which were not present at the time of his emergency department visit.  He works for [employer].  Former tobacco smoker.  Quit years ago.  No alcohol.  Marijuana daily use per patient for ~ 20 years.    Pertinent  Medical History  CKD III - sr cr 2013 1.6 HTN Bronchitis  Significant Hospital Events: Including procedures, antibiotic start and stop dates in addition to other pertinent events   . 4/15 Admitted to ICU. Treated with azithro, ceftriaxone, flagyl, unasyn. Tylenol, acetaminophen, ETOH, influenza, COVID, hepatitis, urine strep antigen, legionella, & UDS negative.  . 4/16 ECHO with global hypokinesis, anteroseptal wall and apex are akinetic, anterior wall severely hypokinetic, very large layered semi-mobile apical thrombus extending from the apex along the anteroseptal wall, LVEF ~15-20%, RV systolic function mild to moderately reduced.  tarted on dobuatmine drip. ANA negative.  . 4/17 Unasyn stopped, cefepime initiated  . 4/18  Dobutamine at , net neg 1.7L, tolerating soft diet, no n/v . 4/19 Dobutamine increased to 7.71mcg . 4/21 Dark Stool, Hb drop - suspected upper GI bleed  Interim History / Subjective:  At end of shift yesterday, patient was altered and there was concern for new CVA. Stat CT scan unremarkable. CBC demonstrated 2 Hb drop and pt latter passed a large dark BM suspicious for blood. This morning patient is lethargic but arousalable. Responding to commands.    Objective   Blood pressure 130/85, pulse (!) 114, temperature 98 F (36.7 C), temperature source Axillary, resp. rate 17, height 6' (1.829 m), weight 76.4 kg, SpO2 100 %. CVP:  [4 mmHg-9 mmHg] 9 mmHg      Intake/Output Summary (Last 24 hours) at 09/20/2020 0958 Last data filed at 09/20/2020 0600 Gross per 24 hour  Intake 4599.81 ml  Output 1100 ml  Net 3499.81 ml   Filed Weights   09/17/2020 0144 09/19/20 0500 09/20/20 0357  Weight: 77.4 kg 73.5 kg 76.4 kg    Examination: General: ill appearing adult male sitting up in bed in NAD HEENT: MM pink/moist, anicteric, good dentition, thrush and oral lesions have cleared  Neuro: Awakens, alert, weakness greatest in lower extremities CV: nml s1/s2, tachycardic, no m/r/g PULM: non-labored at rest, lungs bilaterally clear anterior GI: soft, NTND Extremities: warm/dry, BLE 1+ edema, LLE has large bullous blister  Skin: Ecchymotic appearing lesions on toes.    Resolved Hospital Problem list   Encephalopathy  Assessment & Plan:   Cardiogenic Shock Acute Biventricular Heart Failure LV Thrombus Unknown etiology of heart failure at this time. Ischemic vs infectious vs  inflammatory.  Concern that this is not a typical presentation of heart failure, query underlying autoimmune process or malignancy.  -appreciate Cardiology evaluation  -continue dobutamine, follow SvO2  -Hold heparin gtt -follow up antiphospholipid antibody from 4/16  - Hold diuresis today given bleed -will likely need  L/RHC at some point   C/f Upper GI Bleed Dark stool ON a/w 2 Hb drop. Elevated BUN likely reflects GI bleed.  - Hold heparin at this time  - C/s GI for endoscopy - Continue Protonix IV Continuous - Maintain Type and Screen - Two Large Bore IVs   ?Polymyositis Concern for systemic inflammatory disorder given elevated CRP, ESR, aldolase and CK with severe weakness of lower extremities and pulmonary opacities. - MyoMarker 3 panel pending along with other autoimmune workup. ANCA and ANA panel are negative. - Muscle biopsy obtained 4/20, f/u results - Started solumedrol 125mg  q6hrs on 4/20. Plan to continue through evening of 4/23 and then transition to prednisone 1mg /kg on 4/24. He should remain on 1mg /kg for 4-6 weeks and then tapered after that. He will likely need rheumatology follow up.  Acute Embolic Strokes Generalized weakness Thought secondary to inflammatory process. Neurology consulted for further evaluation. MRI has confirmed acute and subacute strokes - Hold heparin drip per above - weakness may be component of myositis, on steroids. - Repeat stat CT 4/21 unremarkable   Pulmonary opacities Aspiration Pneumonia Multifocal opacities on chest CT concerning for infectious vs inflammatory lesions -Continue cefepime, abx changed 4/17. Plan for 7 days duration  -follow intermittent CXR  Congestive Hepatopathy -follow LFT's, improving -cortrak for nutrition   Acute on Chronic Kidney Disease stage IIIA-B Lactic Acidosis Note uses NSAIDS, ACE-I at home and had contrast on 3/28 -lasix as above for CHF  -Trend BMP / urinary output -Replace electrolytes as indicated -Avoid nephrotoxic agents as able, ensure adequate renal perfusion   Anemia  Thrombocytopenia  Thought in setting of hepatic congestion  -monitor trend -heparin on hold  -transfuse for Hgb <7% - Hematology consulted, low concern for TTP at this time  Elevated Creatinine Kinase / Rhabdomyolysis On statin at PTA   -follow CK, trending down  Acute Metabolic Encephalopathy  -supportive care, improved -follow up ammonia   Moderate Malnutrition  -tube feeds  Thrush -magic mouthwash TID  -fluconazole   Best practice (right click and "Reselect all SmartList Selections" daily)  Diet:  Tube Feed , swallow eval today to start PO diet Pain/Anxiety/Delirium protocol (if indicated): No VAP protocol (if indicated): Not indicated DVT prophylaxis: Systemic AC GI prophylaxis: PPI Glucose control:  SSI No Central venous access:  Yes, and it is still needed Arterial line:  N/A Foley:  N/A,  Mobility:  bed rest  PT consulted: N/A Last date of multidisciplinary goals of care discussion - N/A Code Status:  full code Disposition: ICU  Labs   CBC: Recent Labs  Lab 09/01/2020 1634 09/24/2020 1839 09/16/20 1738 09/16/20 2106 09/17/20 1720 09/10/2020 0210 09/19/20 0554 09/19/20 1648 09/20/20 0406  WBC 18.4*   < > 29.8*   < > 34.0* 35.2* 30.6* 31.6* 40.9*  NEUTROABS 16.2*  --  25.8*  --   --   --   --   --   --   HGB 14.2   < > 8.4*   < > 8.0* 7.9* 7.5* 5.3* 7.0*  HCT 40.3   < > 25.6*   < > 23.7* 24.6* 23.8* 17.3* 21.7*  MCV 83.6   < > 90.1   < > 89.4  91.8 94.4 98.3 95.2  PLT 121*   < > 72*   < > 71* 73* 85* 74* 77*   < > = values in this interval not displayed.    Basic Metabolic Panel: Recent Labs  Lab 09/14/20 0031 09/14/20 0940 09/17/20 0500 09/19/2020 0210 09/15/2020 1709 09/19/20 0554 09/19/20 1648 09/20/20 0406  NA 135   < > 145 146*  --  150* 149* 150*  K 5.9*   < > 4.5 4.1  --  3.8 3.3* 4.2  CL 103   < > 110 108  --  113* 113* 116*  CO2 21*   < > 28 30  --  $R'31 24 28  'ni$ GLUCOSE 122*   < > 140* 127*  --  158* 332* 258*  BUN 99*   < > 98* 80*  --  68* 83* 90*  CREATININE 1.86*   < > 1.97* 1.77*  --  1.63* 2.04* 2.31*  CALCIUM 7.7*   < > 7.5* 7.6*  --  7.7* 7.3* 7.2*  MG 2.8*  --   --   --  2.5* 2.6* 2.7* 2.7*  PHOS 4.0  --   --   --  4.5 3.8 3.2 3.1   < > = values in this interval not  displayed.   GFR: Estimated Creatinine Clearance: 40 mL/min (A) (by C-G formula based on SCr of 2.31 mg/dL (H)). Recent Labs  Lab 09/10/2020 1802 09/26/2020 2114 09/14/20 0031 09/14/20 0940 09/15/20 0321 09/15/20 0836 09/16/20 0501 09/17/20 1239 09/17/20 1720 09/12/2020 0210 09/19/20 0554 09/19/20 1648 09/20/20 0406  PROCALCITON  --   --   --  3.70  --  2.90  --   --   --   --   --   --   --   WBC  --   --    < > 25.3*   < >  --    < >  --  34.0* 35.2* 30.6* 31.6* 40.9*  LATICACIDVEN 2.2* 2.7*  --   --   --   --   --  2.9* 2.2*  --   --   --   --    < > = values in this interval not displayed.    Liver Function Tests: Recent Labs  Lab 09/17/20 0500 09/24/2020 0210 09/19/20 0554 09/19/20 1648 09/20/20 0406  AST 189* 177* 182* 230* 193*  ALT 401* 355* 290* 276* 264*  ALKPHOS 348* 309* 285* 211* 206*  BILITOT 3.3* 3.1* 2.2* 1.9* 1.6*  PROT 4.2* 4.6* 4.9* 4.0* 4.0*  ALBUMIN 1.6* 1.8* 1.7* 1.3* 1.4*   Recent Labs  Lab 09/24/2020 2300  LIPASE 142*   Recent Labs  Lab 08/30/2020 1634 09/17/20 0951  AMMONIA 65* 17    ABG    Component Value Date/Time   PHART 7.437 09/19/2020 2258   PCO2ART 29.3 (L) 08/30/2020 2258   PO2ART 50 (L) 09/06/2020 2258   HCO3 21.7 09/14/2020 0031   TCO2 21 (L) 09/03/2020 2258   ACIDBASEDEF 1.8 09/14/2020 0031   O2SAT 60.3 09/19/2020 0554     Coagulation Profile: Recent Labs  Lab 08/31/2020 2128 09/17/20 0804  INR 1.7* 1.4*    Cardiac Enzymes: Recent Labs  Lab 09/14/20 0031 09/14/20 0940 09/15/20 0836 09/17/20 0504  CKTOTAL 5,299* 6,502* 4,025* 3,219*  CKMB  --   --  14.1*  --     HbA1C: Hgb A1c MFr Bld  Date/Time Value Ref Range Status  09/19/2020 05:54 AM 5.7 (H) 4.8 -  5.6 % Final    Comment:    (NOTE) Pre diabetes:          5.7%-6.4%  Diabetes:              >6.4%  Glycemic control for   <7.0% adults with diabetes     CBG: Recent Labs  Lab 09/19/20 1902 09/19/20 1929 09/19/20 2355 09/20/20 0344 09/20/20 0729   GLUCAP 233* 220* 242* 243* 226*      Critical care time: 40 minutes    Domenick Bookbinder, MS4

## 2020-09-20 NOTE — Progress Notes (Signed)
ANTICOAGULATION CONSULT NOTE - Follow Up Consult  Pharmacy Consult for IV Heparin Indication: LV mural thrombus  Heparin Dosing Weight:  77.4 kg  Labs: Recent Labs    09/19/20 0554 09/19/20 1031 09/19/20 1648 09/20/20 0406 09/20/20 1844 09/20/20 2306  HGB 7.5*  --  5.3* 7.0* 9.6*  --   HCT 23.8*  --  17.3* 21.7* 28.8*  --   PLT 85*  --  74* 77*  --   --   HEPARINUNFRC  --  0.51  --  <0.10*  --  0.31  CREATININE 1.63*  --  2.04* 2.31*  --   --    Assessment: 55 yr old man presented with AMS. Pharmacy was consulted for heparin for NSTEMI; pt was on no anticoagulants PTA. H/H ok on admission, plt low at 121.  Held heparin on 4/18, patient now S/P muscle biopsy. Per Dr. Sheliah Hatch, resumed heparin at 1000 4/20.  MRI brain on 4/20 with multiple acute infarcts and subacute ischemic changes. Per Neurology, will target low end of goal range  On 4/21 in PM patient had large black/dark stool, FOBT+. Hgb 5.3 and transfused 2 units PRBC. Hgb now up to 7.0. Platelets low but stable, today 77. CCM held heparin since 4/22 0025. Patient with L thigh hematoma, per surgery CT scan indicative of post-op hematoma, no indication for drainage. Per CCM, reintroduce heparin cautiously. Per RN no other s/sx bleeding, no further bowel movements.  4/22 PM update:  Heparin level therapeutic after re-start Hgb looking better  Goal of Therapy:  Heparin level 0.3-0.5 units/ml Monitor platelets by anticoagulation protocol: Yes   Plan:  Cont heparin at 1000 units/hr Confirmatory heparin level with AM labs Monitor daily heparin level, CBC, platelets Monitor for signs/symptoms of bleeding   Abran Duke, PharmD, BCPS Clinical Pharmacist Phone: [phone]

## 2020-09-20 NOTE — TOC Progression Note (Signed)
Transition of Care (TOC) - Progression Note  Heart Failure   Patient Details  Name: Dennis Howard. MRN: [id number] Date of Birth: [birth year]  Transition of Care [facility]) CM/SW Contact  Thermon Zulauf, LCSWA Phone Number: 09/20/2020, 3:41 PM  Clinical Narrative:    CSW faxed over clinicals to [facility] with [facility] CIR. Gunnar Fusi [phone]) called the CSW and indicated that the patient is too acute for them to consider at this time and to reach back out if patient's health improves. CSW received a call from [facility] at [facility] CIR [phone]) who reported that they can take the patient's health insurance but need his health improved before being able to accommodate him at their CIR. CSW will reach back out to both CIR programs pending patients disposition.  TOC will continue to follow for d/c needs.     Barriers to Discharge: Continued Medical Work up  Expected Discharge Plan and Services   In-house Referral: Clinical Social Work     Living arrangements for the past 2 months: Apartment                                       Social Determinants of Health (SDOH) Interventions Food Insecurity Interventions: Assist with [employer] Application Financial Strain Interventions: Intervention Not Indicated,Other (Comment) (Provided CSW name and contact info. and will provide resources to patient and for them to follow up with HF clinic upon d/c) Housing Interventions: Intervention Not Indicated Transportation Interventions: Intervention Not Indicated  Readmission Risk Interventions No flowsheet data found.  Wheeler Incorvaia, MSW, LCSWA [phone] Heart Failure Social Worker

## 2020-09-20 NOTE — Progress Notes (Addendum)
eLink Physician-Brief Progress Note Patient Name: Dennis Howard. DOB: 02-Jan-1966 MRN: [id number]   Date of Service  09/20/2020  HPI/Events of Note  Large dark BM suspicious for blood. Patient is on 2nd unit of blood for Hgb = 5.3.  eICU Interventions  Plan: 1. Stool for occult blood.  2. Hold Heparin IV infusion until patient re-evaluated by rounding team. 3. D/C Protonix IV. 4. Protonix IV load and infusion.      Intervention Category Major Interventions: Other:  Lenell Antu 09/20/2020, 12:11 AM

## 2020-09-20 NOTE — Progress Notes (Addendum)
IP PROGRESS NOTE  Subjective:   Dennis Howard is alert.  No complaint.  The bedside RN reports he had a large dark/black stool last night that was Hemoccult positive.  Heparin was placed on hold.  Objective: Vital signs in last 24 hours: Blood pressure 130/85, pulse (!) 114, temperature 98 F (36.7 C), temperature source Axillary, resp. rate 17, height 6' (1.829 m), weight 168 lb 6.9 oz (76.4 kg), SpO2 100 %.  Intake/Output from previous day: 04/21 0701 - 04/22 0700 In: 4719.4 [I.V.:422.2; Blood:576; NG/GT:1200; IV Piggyback:2521.2] Out: 1450 [Urine:1450]  Physical Exam:  HEENT: Tongue ulcers and thrush are improved, no bleeding, nasal feeding tube in place Abdomen: Soft, no hepatosplenomegaly, nontender Extremities: Trace edema at the lower leg bilaterally, few "blister "lesions at the lower legs, light purplish discoloration at the great toe bilaterally Musculoskeletal: There is swelling at the left mid and upper thigh Neurologic: Alert, follows simple commands, moves all extremities to command, oriented to place and year   Right IJ catheter site without evidence of infection or bleeding  Lab Results: Recent Labs    09/19/20 1648 09/20/20 0406  WBC 31.6* 40.9*  HGB 5.3* 7.0*  HCT 17.3* 21.7*  PLT 74* 77*  ADAMTS13 on 09/17/2020 - 38.2% Blood smear 09/20/2020: The platelets are decreased in number, no platelet clumps.  There is a leukocytosis.  The majority the white cells are mature neutrophils.  No blast or other young forms are seen.  Few nucleated red cells.  The polychromasia is increased.  There are burr cells, target cells, acanthocytes, and a few schistocytes. BMET Recent Labs    09/19/20 1648 09/20/20 0406  NA 149* 150*  K 3.3* 4.2  CL 113* 116*  CO2 24 28  GLUCOSE 332* 258*  BUN 83* 90*  CREATININE 2.04* 2.31*  CALCIUM 7.3* 7.2*   Bilirubin 1.6, LDH 594 No results found for: CEA1  Studies/Results: CT HEAD WO CONTRAST  Result Date: 09/19/2020 CLINICAL  DATA:  Altered mental status EXAM: CT HEAD WITHOUT CONTRAST TECHNIQUE: Contiguous axial images were obtained from the base of the skull through the vertex without intravenous contrast. COMPARISON:  09-29-2020, MRI 09/26/2020 FINDINGS: Brain: The previously seen small acute and subacute infarcts by MRI not visible by CT. No new areas of infarction seen. No hemorrhage or hydrocephalus. Vascular: No hyperdense vessel or unexpected calcification. Skull: No acute calvarial abnormality. Sinuses/Orbits: No acute findings Other: None IMPRESSION: Previously seen small left infarcts by MRI not visible by CT. No acute intracranial abnormality. Electronically Signed   By: Charlett Nose M.D.   On: 09/19/2020 18:41   MR BRAIN WO CONTRAST  Result Date: 09/24/2020 CLINICAL DATA:  Neurological deficit. Stroke suspected. Altered mental status. EXAM: MRI HEAD WITHOUT CONTRAST TECHNIQUE: Multiplanar, multiecho pulse sequences of the brain and surrounding structures were obtained without intravenous contrast. COMPARISON:  Head CT Sep 29, 2020 FINDINGS: Brain: 5 mm acute infarction within the inferior cerebellum on the left. 6 mm acute infarction at the left parietooccipital junction. Subacute ischemic change of the cortical and subcortical brain at the left parietooccipital junction. 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 Abnormality affects the brainstem. Cerebral hemispheres otherwise appear normal without evidence underlying small-vessel disease. No mass, hemorrhage, hydrocephalus or extra-axial collection. Vascular: Major vessels at the base of the brain show flow. Skull and upper cervical spine: Negative Sinuses/Orbits: Retention cysts at the maxillary sinus floors. Otherwise negative. Orbits negative. Other: None IMPRESSION: 1. 5 mm acute infarction at the inferior cerebellum  on the left. 2. 6 mm acute infarction at the left parietooccipital junction. 3. Subacute ischemic  changes of the cortical and subcortical brain at the left parietooccipital junction. 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 4. Flow does appear to be present in the vertebrobasilar system. Consider MR angiography the head and neck or CT angiography of the head and neck. 5. The brain appears otherwise normal without evidence other previous insult. Electronically Signed   By: Paulina Fusi M.D.   On: 2020/09/19 14:23    Medications: I have reviewed the patient's current medications.  Assessment/Plan: 1.  Pulmonary infiltrates 2.  Chronic/acute renal failure 3.  Cardiogenic shock on hospital admission 4.  Liver failure secondary to cardiogenic shock, underlying chronic liver disease? 5.  Anemia 6.  Thrombocytopenia 7.   Coagulopathy 8.   Altered mental status-hepatic encephalopathy? 9.   Rhabdomyolysis 10.  Oral candidiasis 11.  Muscle weakness-status post a muscle biopsy September 19, 2020 12.  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  He had an acute drop in the hemoglobin yesterday.  I suspect this is related to bleeding.  He had a dark Hemoccult positive stool.  There is swelling at the left thigh.  He could have a hematoma related to the muscle biopsy.  Heparin has been discontinued.  I continue to have a low suspicion for hemolysis.  The bilirubin is lower and he is not mounting a reticulocytosis.  The elevated LDH is nonspecific.  The platelet count is stable.  The hematologic findings are likely related to a systemic inflammatory condition with bone marrow suppression.  The creatinine is higher today, potentially related to diuresis (now discontinued), hypotension yesterday, and the elevated BUN may be in part secondary to GI bleeding.  Recommendations:  1.  Transfuse packed red blood cells as needed, consider imaging of the left thigh to look for evidence of a  hematoma 2.  Daily CBC 3.  Follow-up muscle biopsy pathology 4.  Steroids as recommended by the critical care and neurology services 5.  Hematology will check on him on 09/21/2020, please call as needed     LOS: 7 days   Thornton Papas, MD   09/20/2020, 9:47 AM

## 2020-09-20 NOTE — Progress Notes (Signed)
PT Cancellation Note  Patient Details Name: Dennis Howard. MRN: [id number] DOB: 11/13/1965   Cancelled Treatment:    Reason Eval/Treat Not Completed: Fatigue/lethargy limiting ability to participate;Other (comment) - Pt resting when PT checked in on pt, per RN pt presently receiving blood and has been in and out of sleep all day. PT deciding to hold today, will check back over the weekend.  Marye Round, PT DPT Acute Rehabilitation Services Pager [phone]  Office [phone]    Truddie Coco 09/20/2020, 3:27 PM

## 2020-09-21 DIAGNOSIS — M3320 Polymyositis, organ involvement unspecified: Secondary | ICD-10-CM

## 2020-09-21 DIAGNOSIS — N179 Acute kidney failure, unspecified: Secondary | ICD-10-CM | POA: Diagnosis not present

## 2020-09-21 DIAGNOSIS — N189 Chronic kidney disease, unspecified: Secondary | ICD-10-CM | POA: Diagnosis not present

## 2020-09-21 DIAGNOSIS — D649 Anemia, unspecified: Secondary | ICD-10-CM | POA: Diagnosis not present

## 2020-09-21 DIAGNOSIS — D696 Thrombocytopenia, unspecified: Secondary | ICD-10-CM | POA: Diagnosis not present

## 2020-09-21 DIAGNOSIS — R570 Cardiogenic shock: Secondary | ICD-10-CM | POA: Diagnosis not present

## 2020-09-21 DIAGNOSIS — R918 Other nonspecific abnormal finding of lung field: Secondary | ICD-10-CM | POA: Diagnosis not present

## 2020-09-21 LAB — QUANTIFERON-TB GOLD PLUS (RQFGPL)
QuantiFERON Mitogen Value: 0.43 IU/mL
QuantiFERON Nil Value: 0.01 IU/mL
QuantiFERON TB1 Ag Value: 0.01 IU/mL
QuantiFERON TB2 Ag Value: 0.02 IU/mL

## 2020-09-21 LAB — BPAM RBC
Blood Product Expiration Date: 202205132359
Blood Product Expiration Date: 202205162359
Blood Product Expiration Date: 202205162359
Blood Product Expiration Date: 202205172359
ISSUE DATE / TIME: 202204212058
ISSUE DATE / TIME: 202204212058
ISSUE DATE / TIME: 202204221106
ISSUE DATE / TIME: 202204221421
Unit Type and Rh: 6200
Unit Type and Rh: 6200
Unit Type and Rh: 6200
Unit Type and Rh: 6200

## 2020-09-21 LAB — CBC
HCT: 26.1 % — ABNORMAL LOW (ref 39.0–52.0)
Hemoglobin: 8.7 g/dL — ABNORMAL LOW (ref 13.0–17.0)
MCH: 30.5 pg (ref 26.0–34.0)
MCHC: 33.3 g/dL (ref 30.0–36.0)
MCV: 91.6 fL (ref 80.0–100.0)
Platelets: 79 10*3/uL — ABNORMAL LOW (ref 150–400)
RBC: 2.85 MIL/uL — ABNORMAL LOW (ref 4.22–5.81)
RDW: 18 % — ABNORMAL HIGH (ref 11.5–15.5)
WBC: 43.5 10*3/uL — ABNORMAL HIGH (ref 4.0–10.5)
nRBC: 1.6 % — ABNORMAL HIGH (ref 0.0–0.2)

## 2020-09-21 LAB — COMPREHENSIVE METABOLIC PANEL
ALT: 305 U/L — ABNORMAL HIGH (ref 0–44)
AST: 285 U/L — ABNORMAL HIGH (ref 15–41)
Albumin: 1.6 g/dL — ABNORMAL LOW (ref 3.5–5.0)
Alkaline Phosphatase: 183 U/L — ABNORMAL HIGH (ref 38–126)
Anion gap: 10 (ref 5–15)
BUN: 106 mg/dL — ABNORMAL HIGH (ref 6–20)
CO2: 25 mmol/L (ref 22–32)
Calcium: 7.1 mg/dL — ABNORMAL LOW (ref 8.9–10.3)
Chloride: 114 mmol/L — ABNORMAL HIGH (ref 98–111)
Creatinine, Ser: 2.66 mg/dL — ABNORMAL HIGH (ref 0.61–1.24)
GFR, Estimated: 28 mL/min — ABNORMAL LOW (ref >60)
Glucose, Bld: 258 mg/dL — ABNORMAL HIGH (ref 70–99)
Potassium: 4.1 mmol/L (ref 3.5–5.1)
Sodium: 149 mmol/L — ABNORMAL HIGH (ref 135–145)
Total Bilirubin: 1.7 mg/dL — ABNORMAL HIGH (ref 0.3–1.2)
Total Protein: 4.3 g/dL — ABNORMAL LOW (ref 6.5–8.1)

## 2020-09-21 LAB — TYPE AND SCREEN
ABO/RH(D): A POS
Antibody Screen: NEGATIVE
Unit division: 0
Unit division: 0
Unit division: 0
Unit division: 0

## 2020-09-21 LAB — QUANTIFERON-TB GOLD PLUS: QuantiFERON-TB Gold Plus: UNDETERMINED — AB

## 2020-09-21 LAB — ANTIPHOSPHOLIPID SYNDROME EVAL, BLD
Anticardiolipin IgA: <9 APL U/mL (ref 0–11)
Anticardiolipin IgG: <9 GPL U/mL (ref 0–14)
Anticardiolipin IgM: <9 MPL U/mL (ref 0–12)
DRVVT: 55.7 s — ABNORMAL HIGH (ref 0.0–47.0)
PTT Lupus Anticoagulant: 43 s (ref 0.0–51.9)
Phosphatydalserine, IgA: 1 APS Units (ref 0–19)
Phosphatydalserine, IgG: <9 Units (ref 0–30)
Phosphatydalserine, IgM: 13 Units (ref 0–30)

## 2020-09-21 LAB — DRVVT CONFIRM: dRVVT Confirm: 1 ratio (ref 0.8–1.2)

## 2020-09-21 LAB — GLUCOSE, CAPILLARY
Glucose-Capillary: 267 mg/dL — ABNORMAL HIGH (ref 70–99)
Glucose-Capillary: 280 mg/dL — ABNORMAL HIGH (ref 70–99)
Glucose-Capillary: 189 mg/dL — ABNORMAL HIGH (ref 70–99)
Glucose-Capillary: 185 mg/dL — ABNORMAL HIGH (ref 70–99)
Glucose-Capillary: 241 mg/dL — ABNORMAL HIGH (ref 70–99)

## 2020-09-21 LAB — DRVVT MIX: dRVVT Mix: 41.2 s — ABNORMAL HIGH (ref 0.0–40.4)

## 2020-09-21 LAB — HEPARIN LEVEL (UNFRACTIONATED): Heparin Unfractionated: 0.36 IU/mL (ref 0.30–0.70)

## 2020-09-21 LAB — HAPTOGLOBIN: Haptoglobin: 152 mg/dL (ref 29–370)

## 2020-09-21 LAB — FUNGITELL, SERUM: Fungitell Result: 74 pg/mL (ref <80)

## 2020-09-21 LAB — CERULOPLASMIN: Ceruloplasmin: 29.9 mg/dL (ref 16.0–31.0)

## 2020-09-21 MED ORDER — OXYCODONE HCL 5 MG PO TABS
5.0000 mg | ORAL_TABLET | Freq: Four times a day (QID) | ORAL | Status: DC | PRN
  Administered 2020-09-21 – 2020-09-23 (×2): 5 mg via ORAL
  Filled 2020-09-21 (×2): qty 1

## 2020-09-21 MED ORDER — FREE WATER
200.0000 mL | Status: DC
  Administered 2020-09-21 – 2020-09-24 (×17): 200 mL

## 2020-09-21 NOTE — Progress Notes (Addendum)
NAME:  Dennis Smithson., MRN:  [id number], DOB:  [birth year], LOS: 8 ADMISSION DATE:  08/31/2020, CONSULTATION DATE: 09/08/2020 REFERRING MD: Dina Rich - EM CHIEF COMPLAINT:  Altered Mental Status  History of Present Illness:  55 y/o man with a past medical history of chronic kidney disease, hypertension who presented to [facility] on 4/15 with CC AMS.  Symptoms started a week prior to admit.  His baseline mental status is alert and oriented to person place time and event.  Mental status changes were described as somnolent and decreased responsiveness.  He has associated lower extremity swelling, weight gain, shortness of breath, and cough. No associated chest pain, fever, syncope. He has never had these symptoms before.  He was recently seen at [facility] emergency department on 08/18/2020 for cough and shortness of breath.  He was diagnosed with pneumonia and discharged home with doxycycline.  He returned to [facility] 4/15 with altered mental status and lower extremity swelling which were not present at the time of his emergency department visit.  He works for [employer].  Former tobacco smoker.  Quit years ago.  No alcohol.  Marijuana daily use per patient for ~ 20 years.    Pertinent  Medical History  CKD III - sr cr 2013 1.6 HTN Bronchitis THC Use  Former Smoker   Significant Hospital Events: Including procedures, antibiotic start and stop dates in addition to other pertinent events   . 4/15 Admitted to ICU. Treated with azithro, ceftriaxone, flagyl, unasyn. Tylenol, acetaminophen, ETOH, influenza, COVID, hepatitis, urine strep antigen, legionella, & UDS negative.  . 4/16 ECHO with global hypokinesis, anteroseptal wall and apex are akinetic, anterior wall severely hypokinetic, very large layered semi-mobile apical thrombus extending from the apex along the anteroseptal wall, LVEF ~35-57%, RV systolic function mild to moderately reduced.  tarted on dobuatmine drip. ANA negative. Blood cultures negative.  . 4/17  Unasyn stopped, cefepime initiated  . 4/18 Dobutamine at 69mcg, net neg 1.7L, tolerating soft diet, no n/v . 4/19 Dobutamine increased to 7.19mcg . 4/20 MRI Brain >> 17mm acute infarction at the inferior cerebellum on left and 55mm acute infarction at the left parietooccipital junction. Subacute ischemic changes of the cortical subcortical brain. . 4/22 Thigh hematoma post biopsy . 4/23 Dobutamine at 6.31mcg, pt on heparin gtt, PPI gtt. Muscle biopsy pending.   Interim History / Subjective:  I/O 1.5L UOP, +828ml in last 24 hours (negative balance overall) On dobutamine 6.40mcg  Pt reports he is tired of being here / sick. Notes mouth ulcers remain sore but improved  Glucose range 267-280  Afebrile / WBC 43.5   Objective   Blood pressure (!) 141/94, pulse (!) 111, temperature 97.8 F (36.6 C), temperature source Oral, resp. rate 16, height 6' (1.829 m), weight 76.4 kg, SpO2 100 %. CVP:  [2 mmHg-4 mmHg] 2 mmHg      Intake/Output Summary (Last 24 hours) at 09/21/2020 0945 Last data filed at 09/21/2020 0800 Gross per 24 hour  Intake 2509.13 ml  Output 1550 ml  Net 959.13 ml   Filed Weights   09/19/20 0500 09/20/20 0357 09/21/20 0500  Weight: 73.5 kg 76.4 kg 76.4 kg    Examination: General: ill appearing adult male lying in bed in NAD HEENT: MM pink/dry, anicteric, good dentition, thrush resolved, continues to have tongue lesion Neuro: Awake, alert, generalized weakness LE > UE, follows commands / appropriate CV: s1s2 RRR, no m/r/g PULM: non-labored at rest, lungs clear bilaterally  GI: soft, bsx4 active  Extremities: warm/dry, 1+  pitting edema up to thigh Skin: blistering noted on LE's, linear abrasions   Resolved Hospital Problem list   Encephalopathy  Assessment & Plan:   Cardiogenic Shock Acute Biventricular Heart Failure LV Thrombus Unknown etiology of heart failure at this time. Ischemic vs infectious vs inflammatory.  Concern that this is not a typical presentation of  heart failure, query underlying autoimmune process or malignancy.  -appreciate Cardiology evaluation  -continue dobutamine gtt, trend SvO2 -heparin gtt per pharmacy  -follow antiphospholipid antibody from 4/16, pending  -hold further diuresis 4/23  -will need L/RHC at some point  -large bore L IJ cath in place, consider transition to PICC  ?Polymyositis Concern for systemic inflammatory disorder given elevated CRP, ESR, aldolase and CK with severe weakness of lower extremities and pulmonary opacities. ANCA, ANA negative  -await muscle biopsy results from 4/20  -follow up MyoMarker 3 panel  -continue solumedrol 125mg  Q6 (started on 4/20). Plan to transition to 1mg /kg in am 4/24 and should remain on dose for 4-6 weeks, then taper off after.  -will likely need Rheumatology follow up post discharge   Acute Embolic Strokes Generalized weakness Acute Metabolic Encephalopathy  Thought secondary to inflammatory process. Neurology consulted for further evaluation. MRI has confirmed acute and subacute strokes. Mild elevation ammonia but cleared.  -continue heparin gtt -? If weakness component of myositis, on steroids since 4/20    Pulmonary Opacities Aspiration Pneumonia Multifocal opacities on chest CT concerning for infectious vs inflammatory lesions -continue cefepime, stop date in place.  D7/7 -follow CXR intermittently  -pulmonary hygiene   Congestive Hepatopathy -follow LFT trend   Acute on Chronic Kidney Disease stage IIIA-B Lactic Acidosis Note uses NSAIDS, ACE-I at home and had contrast on 3/28  -Trend BMP / urinary output -Replace electrolytes as indicated -Avoid nephrotoxic agents, ensure adequate renal perfusion -hold lasix with AKI   Anemia  Thrombocytopenia  Thought in setting of hepatic congestion  -trend CBC -continue heparin gtt and monitor for bleeding  -transfuse for Hgb <7% -appreciate Hematology input, low concern for TTP at this time -follow thigh hematoma  post biopsy   Elevated Creatinine Kinase / Rhabdomyolysis On statin at PTA  -follow CK trend, repeat in am    Moderate Malnutrition  -continue TF   Thrush S/p fluconazole -Magic Mouthwash TID  -oral care   Hyperglycemia  No hx DM, suspect steroid induced  -SSI, resistant scale  -TF coverage 4 units Q4  -continue levemir 10 units BID   Best practice (right click and "Reselect all SmartList Selections" daily)  Diet:  Tube Feed , PO diet as tolerated  Pain/Anxiety/Delirium protocol (if indicated): No VAP protocol (if indicated): Not indicated DVT prophylaxis: Systemic AC GI prophylaxis: PPI Glucose control:  SSI No Central venous access:  Yes, and it is still needed Arterial line:  N/A Foley:  N/A,  Mobility:  bed rest  PT consulted: N/A Last date of multidisciplinary goals of care discussion - N/A Code Status:  full code Disposition: ICU  Critical care time: 36 minutes    Noe Gens, MSN, APRN, NP-C, AGACNP-BC [facility] 09/21/2020, 9:45 AM   Please see [email or web address] for pager details.   From 7A-7P if no response, please call [phone] After hours, please call ELink [phone]

## 2020-09-21 NOTE — Progress Notes (Addendum)
ANTICOAGULATION CONSULT NOTE - Follow Up Consult  Pharmacy Consult for IV Heparin Indication: LV mural thrombus  Heparin Dosing Weight:  77.4 kg  Labs: Recent Labs    09/19/20 1648 09/20/20 0406 09/20/20 1844 09/20/20 2306 09/21/20 0604  HGB 5.3* 7.0* 9.6*  --  8.7*  HCT 17.3* 21.7* 28.8*  --  Dennis.1*  PLT 74* 77*  --   --  79*  HEPARINUNFRC  --  <0.10*  --  0.31 0.36  CREATININE 2.04* 2.31*  --   --  2.66*   Assessment: 55 yr old Howard presented with AMS. Pharmacy was consulted for heparin for NSTEMI; pt was on no anticoagulants PTA. H/H ok on admission, plt low at 121.  Held heparin on 4/18, patient now S/P muscle biopsy. Per Dr. Sheliah Hatch, resumed heparin at 1000 4/20.  MRI brain on 4/20 with multiple acute infarcts and subacute ischemic changes. Per Neurology, will target low end of goal range  On 4/21 in PM patient had large black/dark stool, FOBT+. Hgb 5.3 and transfused 2 units PRBC. CCM held heparin since 4/22 0025. Patient with L thigh hematoma, per surgery CT scan indicative of post-op hematoma, no indication for drainage. Per CCM, reintroduce heparin cautiously.   Heparin level 0.36 on heparin 1000 units/hr is therapeutic. No bowel movement since 4/21 night which was noted to be black. No other bleeding noted. Hgb 8.7. Plt 79 - low but stable.   Goal of Therapy:  Heparin level 0.3-0.5 units/ml; no bolus Monitor platelets by anticoagulation protocol: Yes   Plan:  Continue heparin 1000 units/hr  Monitor daily heparin level, CBC, platelets Monitor for signs/symptoms of bleeding - RN to call if bowel movement noted to be bloody  Gerrit Halls, PharmD Clinical Pharmacist  09/21/2020 10:09 AM

## 2020-09-21 NOTE — Progress Notes (Signed)
IP PROGRESS NOTE  Subjective:   Dennis Howard is alert but not conversant. Nurse reports his status is mostly unchanged from prior.   Objective: Vital signs in last 24 hours: Blood pressure (!) 137/94, pulse (!) 115, temperature 97.8 F (36.6 C), temperature source Oral, resp. rate 18, height 6' (1.829 m), weight 168 lb 6.9 oz (76.4 kg), SpO2 100 %.  Intake/Output from previous day: 04/22 0701 - 04/23 0700 In: 2390.4 [I.V.:528.3; Blood:630; NG/GT:1131; IV Piggyback:101.1] Out: 1550 [Urine:1550]  Physical Exam:  HEENT: no bleeding, nasal feeding tube in place Abdomen: Soft, no hepatosplenomegaly, nontender Extremities: Trace edema at the lower leg bilaterally, few "blister "lesions at the lower legs, light purplish discoloration at the great toe bilaterally Musculoskeletal: There is swelling at the left mid and upper thigh Neurologic: Alert, follows simple commands, moves all extremities to command, oriented to place and year  Right IJ catheter site without evidence of infection or bleeding  Lab Results: Recent Labs    09/20/20 0406 09/20/20 1844 09/21/20 0604  WBC 40.9*  --  43.5*  HGB 7.0* 9.6* 8.7*  HCT 21.7* 28.8* 26.1*  PLT 77*  --  79*  ADAMTS13 on 09/17/2020 - 38.2% Blood smear 09/20/2020: The platelets are decreased in number, no platelet clumps.  There is a leukocytosis.  The majority the white cells are mature neutrophils.  No blast or other young forms are seen.  Few nucleated red cells.  The polychromasia is increased.  There are burr cells, target cells, acanthocytes, and a few schistocytes. BMET Recent Labs    09/20/20 0406 09/21/20 0604  NA 150* 149*  K 4.2 4.1  CL 116* 114*  CO2 28 25  GLUCOSE 258* 258*  BUN 90* 106*  CREATININE 2.31* 2.66*  CALCIUM 7.2* 7.1*   Bilirubin 1.6, LDH 594 No results found for: CEA1  Studies/Results: DG Chest 1 View  Result Date: 09/20/2020 CLINICAL DATA:  Respiratory failure. EXAM: CHEST  1 VIEW COMPARISON:  September 14, 2020. FINDINGS: The heart size and mediastinal contours are within normal limits. Both lungs are clear. No pneumothorax or pleural effusion is noted. Right internal jugular catheter is unchanged in position. Feeding tube is seen entering stomach. The visualized skeletal structures are unremarkable. IMPRESSION: No active disease. Electronically Signed   By: Lupita Raider M.D.   On: 09/20/2020 13:58   CT HEAD WO CONTRAST  Result Date: 09/19/2020 CLINICAL DATA:  Altered mental status EXAM: CT HEAD WITHOUT CONTRAST TECHNIQUE: Contiguous axial images were obtained from the base of the skull through the vertex without intravenous contrast. COMPARISON:  09/17/2020, MRI 09/19/2020 FINDINGS: Brain: The previously seen small acute and subacute infarcts by MRI not visible by CT. No new areas of infarction seen. No hemorrhage or hydrocephalus. Vascular: No hyperdense vessel or unexpected calcification. Skull: No acute calvarial abnormality. Sinuses/Orbits: No acute findings Other: None IMPRESSION: Previously seen small left infarcts by MRI not visible by CT. No acute intracranial abnormality. Electronically Signed   By: Charlett Nose M.D.   On: 09/19/2020 18:41   CT FEMUR LEFT WO CONTRAST  Result Date: 09/20/2020 CLINICAL DATA:  Patient is status post muscle biopsy from the right thigh 09/19/20. Pain and swelling. EXAM: CT OF THE LOWER LEFT EXTREMITY WITHOUT CONTRAST TECHNIQUE: Multidetector CT imaging of the lower left extremity was performed according to the standard protocol. COMPARISON:  None. FINDINGS: Bones/Joint/Cartilage Imaged bones appear normal without fracture, stress change or worrisome lesion. Ligaments Suboptimally assessed by CT. Muscles and Tendons There is a  fluid collection in the mid rectus femoris which measures 5.5 cm craniocaudal by 6.1 cm transverse by 3.1 cm AP. Hounsfield unit measurements within the collection are 19.7. There is a small amount of gas within the collection and a small amount  of gas within adjacent fascial planes. A fascial calcification in the medial compartment is likely due to some prior injury or inflammatory process. Soft tissues There is stranding in subcutaneous tissues throughout the thigh. No focal fluid collection. Partially visualized right thigh demonstrates similar appearing stranding in subcutaneous. IMPRESSION: Fluid collection at the site of biopsy has an appearance most compatible with a postoperative seroma or possibly an abscess. There is a small amount of gas about the biopsy site consistent with recent surgery. Stranding in subcutaneous fatty tissues of the left thigh throughout could be due to dependent change or cellulitis. Dependent change is favored given partial visualization of stranding in subcutaneous fat of the right thigh. Electronically Signed   By: Drusilla Kanner M.D.   On: 09/20/2020 12:15    Medications: I have reviewed the patient's current medications.  Assessment/Plan: 1.  Pulmonary infiltrates 2.  Chronic/acute renal failure 3.  Cardiogenic shock on hospital admission 4.  Liver failure secondary to cardiogenic shock, underlying chronic liver disease? 5.  Anemia 6.  Thrombocytopenia 7.   Coagulopathy 8.   Altered mental status-hepatic encephalopathy? 9.   Rhabdomyolysis 10.  Oral candidiasis 11.  Muscle weakness-status post a muscle biopsy 09/08/2020 12.  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  He responds to questions with nods.  I continue to have a low suspicion for hemolysis.  The bilirubin is lower and he is not mounting a reticulocytosis.  The elevated LDH is nonspecific.  The platelet count is stable.  The hematologic findings are likely related to a systemic inflammatory condition with bone marrow suppression.  The creatinine continues to increase today, potentially related to diuresis (now discontinued), hypotension, and the elevated  BUN may be in part secondary to GI bleeding.  Recommendations:  1.  Transfuse packed red blood cells as needed 2.  Daily CBC 3.  Follow-up muscle biopsy pathology 4.  Steroids as recommended by the critical care and neurology services 5.  Hematology will continue to follow, please call with any questions or concerns 6. Low suspicion that his findings are driven by a primary hematological process.     LOS: 8 days   Ulysees Barns, MD Department of Hematology/Oncology [facility] Phone: [phone] Pager: [phone] Email: [email or web address]    09/21/2020, 1:34 PM

## 2020-09-22 DIAGNOSIS — N179 Acute kidney failure, unspecified: Secondary | ICD-10-CM | POA: Diagnosis not present

## 2020-09-22 DIAGNOSIS — R918 Other nonspecific abnormal finding of lung field: Secondary | ICD-10-CM

## 2020-09-22 DIAGNOSIS — M3320 Polymyositis, organ involvement unspecified: Secondary | ICD-10-CM | POA: Diagnosis not present

## 2020-09-22 DIAGNOSIS — R570 Cardiogenic shock: Secondary | ICD-10-CM | POA: Diagnosis not present

## 2020-09-22 LAB — BASIC METABOLIC PANEL
Anion gap: 8 (ref 5–15)
BUN: 117 mg/dL — ABNORMAL HIGH (ref 6–20)
CO2: 23 mmol/L (ref 22–32)
Calcium: 7 mg/dL — ABNORMAL LOW (ref 8.9–10.3)
Chloride: 116 mmol/L — ABNORMAL HIGH (ref 98–111)
Creatinine, Ser: 2.91 mg/dL — ABNORMAL HIGH (ref 0.61–1.24)
GFR, Estimated: 25 mL/min — ABNORMAL LOW (ref >60)
Glucose, Bld: 289 mg/dL — ABNORMAL HIGH (ref 70–99)
Potassium: 4.4 mmol/L (ref 3.5–5.1)
Sodium: 147 mmol/L — ABNORMAL HIGH (ref 135–145)

## 2020-09-22 LAB — GLUCOSE, CAPILLARY
Glucose-Capillary: 207 mg/dL — ABNORMAL HIGH (ref 70–99)
Glucose-Capillary: 233 mg/dL — ABNORMAL HIGH (ref 70–99)
Glucose-Capillary: 240 mg/dL — ABNORMAL HIGH (ref 70–99)
Glucose-Capillary: 243 mg/dL — ABNORMAL HIGH (ref 70–99)
Glucose-Capillary: 263 mg/dL — ABNORMAL HIGH (ref 70–99)
Glucose-Capillary: 278 mg/dL — ABNORMAL HIGH (ref 70–99)

## 2020-09-22 LAB — HEPARIN LEVEL (UNFRACTIONATED)
Heparin Unfractionated: 0.54 IU/mL (ref 0.30–0.70)
Heparin Unfractionated: 0.5 IU/mL (ref 0.30–0.70)
Heparin Unfractionated: 0.46 IU/mL (ref 0.30–0.70)

## 2020-09-22 LAB — CBC
HCT: 25.6 % — ABNORMAL LOW (ref 39.0–52.0)
Hemoglobin: 8.2 g/dL — ABNORMAL LOW (ref 13.0–17.0)
MCH: 30.4 pg (ref 26.0–34.0)
MCHC: 32 g/dL (ref 30.0–36.0)
MCV: 94.8 fL (ref 80.0–100.0)
Platelets: 77 10*3/uL — ABNORMAL LOW (ref 150–400)
RBC: 2.7 MIL/uL — ABNORMAL LOW (ref 4.22–5.81)
RDW: 18.8 % — ABNORMAL HIGH (ref 11.5–15.5)
WBC: 40.4 10*3/uL — ABNORMAL HIGH (ref 4.0–10.5)
nRBC: 1.8 % — ABNORMAL HIGH (ref 0.0–0.2)

## 2020-09-22 LAB — HEMOGLOBIN AND HEMATOCRIT, BLOOD
HCT: 25 % — ABNORMAL LOW (ref 39.0–52.0)
Hemoglobin: 8.1 g/dL — ABNORMAL LOW (ref 13.0–17.0)

## 2020-09-22 MED ORDER — WHITE PETROLATUM EX OINT
TOPICAL_OINTMENT | CUTANEOUS | Status: AC
  Filled 2020-09-22: qty 28.35

## 2020-09-22 MED ORDER — PREDNISONE 20 MG PO TABS
40.0000 mg | ORAL_TABLET | Freq: Every day | ORAL | Status: DC
  Administered 2020-09-22 – 2020-09-23 (×2): 40 mg via ORAL
  Filled 2020-09-22 (×2): qty 2

## 2020-09-22 MED FILL — Sodium Chloride IV Soln 0.9%: INTRAVENOUS | Qty: 100 | Status: AC

## 2020-09-22 MED FILL — Pantoprazole Sodium For IV Soln 40 MG (Base Equiv): INTRAVENOUS | Qty: 80 | Status: AC

## 2020-09-22 NOTE — Progress Notes (Addendum)
ANTICOAGULATION CONSULT NOTE - Follow Up Consult  Pharmacy Consult for IV Heparin Indication: LV mural thrombus  Heparin Dosing Weight:  77.4 kg  Labs: Recent Labs    09/20/20 0406 09/20/20 1844 09/20/20 2306 09/21/20 0604 09/22/20 0242  HGB 7.0* 9.6*  --  8.7* 8.2*  HCT 21.7* 28.8*  --  26.1* 25.6*  PLT 77*  --   --  79* 77*  HEPARINUNFRC <0.10*  --  0.31 0.36 0.54  CREATININE 2.31*  --   --  2.66* 2.91*   Assessment: 55 yr old man presented with AMS. Pharmacy was consulted for heparin for NSTEMI; pt was on no anticoagulants PTA. H/H ok on admission, plt low at 121.  Held heparin on 4/18, patient now S/P muscle biopsy. Per Dr. Sheliah Hatch, resumed heparin at 1000 4/20.  MRI brain on 4/20 with multiple acute infarcts and subacute ischemic changes. Per Neurology, will target low end of goal range  On 4/21 in PM patient had large black/dark stool, FOBT+. Hgb 5.3 and transfused 2 units PRBC. CCM held heparin since 4/22 0025. Patient with L thigh hematoma, per surgery CT scan indicative of post-op hematoma, no indication for drainage. Per CCM, reintroduce heparin cautiously.   Heparin level 0.54 on heparin 1000 units/hr is supratherapeutic - goal 0.3 to 0.5. Level drawn appropriately per RN. RN reported large black bowel movement overnight - CCM aware and wanted to check CBC. Hgb 8.2. Plt 77 - low but stable.   Goal of Therapy:  Heparin level 0.3-0.5 units/ml; no bolus Monitor platelets by anticoagulation protocol: Yes   Plan:  Decrease heparin to 950 units/hr  Check 6 hr heparin level  Monitor daily heparin level, CBC, platelets Monitor for signs/symptoms of bleeding - contacting daytime CCM to discuss black bowel movement  Gerrit Halls, PharmD Clinical Pharmacist  09/22/2020 7:01 AM   Addendum: Discussed with Dr. Craige Cotta and okay to continue heparin and monitor bleeding. RN updated.   Gerrit Halls, PharmD Clinical Pharmacist  Addendum: Heparin level 0.5 is therapeutic on  heparin rate reduction of 950 units/hr though at upper range of goal. No BMs and no further bleeding noted.   Plan:  - Decrease heparin to 900 units/hr to ensure remains in therapeutic range  - Check 6hr heparin level

## 2020-09-22 NOTE — Progress Notes (Signed)
ANTICOAGULATION CONSULT NOTE - Follow Up Consult  Pharmacy Consult for IV Heparin Indication: LV mural thrombus  Heparin Dosing Weight:  77.4 kg  Labs: Recent Labs    09/20/20 0406 09/20/20 1844 09/21/20 0604 09/22/20 0242 09/22/20 1330 09/22/20 1618 09/22/20 2120  HGB 7.0*   < > 8.7* 8.2*  --  8.1*  --   HCT 21.7*   < > 26.1* 25.6*  --  25.0*  --   PLT 77*  --  79* 77*  --   --   --   HEPARINUNFRC <0.10*   < > 0.36 0.54 0.50  --  0.46  CREATININE 2.31*  --  2.66* 2.91*  --   --   --    < > = values in this interval not displayed.   Assessment: 55 yr old man presented with AMS. Pharmacy was consulted for heparin for NSTEMI; pt was on no anticoagulants PTA. H/H ok on admission, plt low at 121.  Held heparin on 4/18, patient now S/P muscle biopsy. Per Dr. Sheliah Hatch, resumed heparin at 1000 4/20.  MRI brain on 4/20 with multiple acute infarcts and subacute ischemic changes. Per Neurology, will target low end of goal range  On 4/21 in PM patient had large black/dark stool, FOBT+. Hgb 5.3 and transfused 2 units PRBC. CCM held heparin since 4/22 0025. Patient with L thigh hematoma, per surgery CT scan indicative of post-op hematoma, no indication for drainage. Per CCM, reintroduce heparin cautiously.   Heparin level this evening came back within goal range at 0.46, on 900 units/hr. Hgb 8.1 on last check. No infusion issues. Still having black movements - stable but not worsening.   Goal of Therapy:  Heparin level 0.3-0.5 units/ml; no bolus Monitor platelets by anticoagulation protocol: Yes   Plan:  Decrease heparin to 850 units/hr  Check 6-8 hr heparin level  Monitor daily heparin level, CBC, platelets Monitor for signs/symptoms of bleeding - contacting daytime CCM to discuss black bowel movement  Sherron Monday, PharmD, BCCCP Clinical Pharmacist  Phone: [phone] 09/22/2020 9:58 PM  Please check AMION for all [facility] phone numbers After 10:00 PM, call Main Pharmacy  [phone]

## 2020-09-22 NOTE — Progress Notes (Signed)
NAME:  Dennis Buchta., MRN:  [id number], DOB:  August 07, 1965, LOS: 9 ADMISSION DATE:  16-Sep-2020, CONSULTATION DATE: September 16, 2020 REFERRING MD: Wilkie Aye - EM CHIEF COMPLAINT:  Altered Mental Status  History of Present Illness:  55 yo male former smoker presented with altered mental status that progressed for one week prior to admission.  This was associated with lower leg swelling with blisters, weight gain, dyspnea, and cough.  Also had progressive muscle weakness.  Had recent outpt treatment for pneumonia with doxycycline.  Pertinent  Medical History  CKD III - sr cr 2013 1.6, HTN, Bronchitis, THC Use    Significant Hospital Events: Including procedures, antibiotic start and stop dates in addition to other pertinent events   . 4/15 Admitted to ICU. Treated with azithro, ceftriaxone, flagyl, unasyn. Tylenol, acetaminophen, ETOH, influenza, COVID, hepatitis, urine strep antigen, legionella, & UDS negative.  . 4/16 ECHO with global hypokinesis, anteroseptal wall and apex are akinetic, anterior wall severely hypokinetic, very large layered semi-mobile apical thrombus extending from the apex along the anteroseptal wall, LVEF ~15-20%, RV systolic function mild to moderately reduced.  tarted on dobuatmine drip. ANA negative. Blood cultures negative.  . 4/17 Unasyn stopped, cefepime initiated  . 4/18 Dobutamine at , net neg 1.7L, tolerating soft diet, no n/v . 4/19 Dobutamine increased to 7.36mcg . 4/20 MRI Brain >> 80mm acute infarction at the inferior cerebellum on left and 59mm acute infarction at the left parietooccipital junction. Subacute ischemic changes of the cortical subcortical brain. . 4/22 Thigh hematoma post biopsy . 4/23 Dobutamine at 6.72mcg, pt on heparin gtt, PPI gtt. Muscle biopsy pending.  . 4/24 dark colored bowel movement, hemoglobin stable  Interim History / Subjective:  Still weak in legs and c/o pain in legs.  Arm strength improved some.  Denies chest pain.  Breathing  okay.  Objective   Blood pressure (!) 133/92, pulse (!) 112, temperature 97.8 F (36.6 C), temperature source Oral, resp. rate 17, height 6' (1.829 m), weight 80.3 kg, SpO2 100 %. CVP:  [0 mmHg-8 mmHg] 6 mmHg      Intake/Output Summary (Last 24 hours) at 09/22/2020 0817 Last data filed at 09/22/2020 0800 Gross per 24 hour  Intake 2206.81 ml  Output 1500 ml  Net 706.81 ml   Filed Weights   09/20/20 0357 09/21/20 0500 09/22/20 0130  Weight: 76.4 kg 76.4 kg 80.3 kg    Examination:  General - alert Eyes - pupils reactive ENT - no sinus tenderness, no stridor Cardiac - regular, tachycardic Chest - equal breath sounds b/l, no wheezing or rales Abdomen - soft, non tender, + bowel sounds Extremities - 3+ edema Skin - blistering on legs Neuro - 4/5 strength in arms, legs are weak Psych - normal mood and behavior  Resolved Hospital Problem list   Encephalopathy  Assessment & Plan:   Diffuse muscle weakness with elevated CPK and aldolase.  - main concern for polymyositis - transitioned to prednisone 40 mg daily on 4/24; continue this dose for next several weeks before considering dose reduction - f/u muscle biopsy from 4/21  - neurology following    Acute systolic CHF with cardiogenic shock.  LV thrombus.  Hx of HTN, HLD.  - continue dobutamine, heparin gtt - heart failure team following  - hold outpt lipitor, norvasc, lisinopril  - will likely need LHC/RHC at some point   Pulmonary infiltrates.  - infection versus inflammatory  - completed ABX 4/23 - continue solumedrol - f/u CXR intermittently   Anemia, thrombocytopenia.  -  hematology consulted  - f/u CBC   Steroid induced hyperglycemia.  - SSI resistant scale with tube feed coverage at 4 units q4h, levemir 10 units bid   AKI, Hypernatremia.  CKD 3b. - even fluid balance  - f/u BMET - monitor urine outpt  - if renal function gets worse, then might need nephrology assessment   Dysphagia.  Moderate  protein calorie malnutrition. 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 - likely from passive congestion in setting of heart failure - f/u LFTs  Congestive Hepatopathy -follow LFT trend   Melanotic stool. - Hb stable - continue protonix - if recurs, then might need GI assessment  Thrush. - improved - magic mouth wash prn  Best practice (right click and "Reselect all SmartList Selections" daily)  Diet:  Tube Feed , PO diet as tolerated  Pain/Anxiety/Delirium protocol (if indicated): No VAP protocol (if indicated): Not indicated DVT prophylaxis: Systemic AC GI prophylaxis: PPI Glucose control:  SSI No Central venous access:  Yes, and it is still needed Arterial line:  N/A Foley:  N/A,  Mobility:  bed rest  PT consulted: N/A Last date of multidisciplinary goals of care discussion - N/A Code Status:  full code Disposition: ICU  Labs:   CMP Latest Ref Rng & Units 09/22/2020 09/21/2020 09/20/2020  Glucose 70 - 99 mg/dL 885(O) 277(A) 128(N)  BUN 6 - 20 mg/dL 867(E) 720(N) 47(S)  Creatinine 0.61 - 1.24 mg/dL 9.62(E) 3.66(Q) 9.47(M)  Sodium 135 - 145 mmol/L 147(H) 149(H) 150(H)  Potassium 3.5 - 5.1 mmol/L 4.4 4.1 4.2  Chloride 98 - 111 mmol/L 116(H) 114(H) 116(H)  CO2 22 - 32 mmol/L 23 25 28   Calcium 8.9 - 10.3 mg/dL 7.0(L) 7.1(L) 7.2(L)  Total Protein 6.5 - 8.1 g/dL - 4.3(L) 4.0(L)  Total Bilirubin 0.3 - 1.2 mg/dL - 1.7(H) 1.6(H)  Alkaline Phos 38 - 126 U/L - 183(H) 206(H)  AST 15 - 41 U/L - 285(H) 193(H)  ALT 0 - 44 U/L - 305(H) 264(H)    CBC Latest Ref Rng & Units 09/22/2020 09/21/2020 09/20/2020  WBC 4.0 - 10.5 K/uL 40.4(H) 43.5(H) -  Hemoglobin 13.0 - 17.0 g/dL 8.2(L) 8.7(L) 9.6(L)  Hematocrit 39.0 - 52.0 % 25.6(L) 26.1(L) 28.8(L)  Platelets 150 - 400 K/uL 77(L) 79(L) -    ABG    Component Value Date/Time   PHART 7.437 09/25/2020 2258   PCO2ART 29.3 (L) 09/03/2020 2258   PO2ART 50 (L) 09/04/2020 2258   HCO3 21.7  09/14/2020 0031   TCO2 21 (L) 09/17/2020 2258   ACIDBASEDEF 1.8 09/14/2020 0031   O2SAT 60.3 09/19/2020 0554    CBG (last 3)  Recent Labs    09/22/20 0023 09/22/20 0402 09/22/20 0744  GLUCAP 278* 263* 207*    Critical care time: 37 minutes  09/24/20, MD [facility] Pager - [phone] 09/22/2020, 8:33 AM

## 2020-09-23 ENCOUNTER — Inpatient Hospital Stay (HOSPITAL_COMMUNITY): Payer: BLUE CROSS/BLUE SHIELD

## 2020-09-23 DIAGNOSIS — D696 Thrombocytopenia, unspecified: Secondary | ICD-10-CM | POA: Diagnosis not present

## 2020-09-23 DIAGNOSIS — D62 Acute posthemorrhagic anemia: Secondary | ICD-10-CM

## 2020-09-23 DIAGNOSIS — N189 Chronic kidney disease, unspecified: Secondary | ICD-10-CM | POA: Diagnosis not present

## 2020-09-23 DIAGNOSIS — D649 Anemia, unspecified: Secondary | ICD-10-CM | POA: Diagnosis not present

## 2020-09-23 DIAGNOSIS — A419 Sepsis, unspecified organism: Secondary | ICD-10-CM | POA: Diagnosis not present

## 2020-09-23 DIAGNOSIS — N179 Acute kidney failure, unspecified: Secondary | ICD-10-CM

## 2020-09-23 DIAGNOSIS — R570 Cardiogenic shock: Secondary | ICD-10-CM | POA: Diagnosis not present

## 2020-09-23 DIAGNOSIS — G9340 Encephalopathy, unspecified: Secondary | ICD-10-CM | POA: Diagnosis not present

## 2020-09-23 DIAGNOSIS — N1832 Chronic kidney disease, stage 3b: Secondary | ICD-10-CM | POA: Diagnosis not present

## 2020-09-23 DIAGNOSIS — R918 Other nonspecific abnormal finding of lung field: Secondary | ICD-10-CM | POA: Diagnosis not present

## 2020-09-23 LAB — COMPREHENSIVE METABOLIC PANEL
ALT: 493 U/L — ABNORMAL HIGH (ref 0–44)
AST: 412 U/L — ABNORMAL HIGH (ref 15–41)
Albumin: 1.8 g/dL — ABNORMAL LOW (ref 3.5–5.0)
Alkaline Phosphatase: 172 U/L — ABNORMAL HIGH (ref 38–126)
Anion gap: 11 (ref 5–15)
BUN: 143 mg/dL — ABNORMAL HIGH (ref 6–20)
CO2: 21 mmol/L — ABNORMAL LOW (ref 22–32)
Calcium: 7.1 mg/dL — ABNORMAL LOW (ref 8.9–10.3)
Chloride: 112 mmol/L — ABNORMAL HIGH (ref 98–111)
Creatinine, Ser: 3.56 mg/dL — ABNORMAL HIGH (ref 0.61–1.24)
GFR, Estimated: 20 mL/min — ABNORMAL LOW (ref >60)
Glucose, Bld: 216 mg/dL — ABNORMAL HIGH (ref 70–99)
Potassium: 5.2 mmol/L — ABNORMAL HIGH (ref 3.5–5.1)
Sodium: 144 mmol/L (ref 135–145)
Total Bilirubin: 1.8 mg/dL — ABNORMAL HIGH (ref 0.3–1.2)
Total Protein: 4.7 g/dL — ABNORMAL LOW (ref 6.5–8.1)

## 2020-09-23 LAB — RENAL FUNCTION PANEL
Albumin: 1.9 g/dL — ABNORMAL LOW (ref 3.5–5.0)
Anion gap: 15 (ref 5–15)
BUN: 156 mg/dL — ABNORMAL HIGH (ref 6–20)
CO2: 21 mmol/L — ABNORMAL LOW (ref 22–32)
Calcium: 7 mg/dL — ABNORMAL LOW (ref 8.9–10.3)
Chloride: 108 mmol/L (ref 98–111)
Creatinine, Ser: 4.07 mg/dL — ABNORMAL HIGH (ref 0.61–1.24)
GFR, Estimated: 17 mL/min — ABNORMAL LOW (ref >60)
Glucose, Bld: 189 mg/dL — ABNORMAL HIGH (ref 70–99)
Phosphorus: 5.5 mg/dL — ABNORMAL HIGH (ref 2.5–4.6)
Potassium: 6 mmol/L — ABNORMAL HIGH (ref 3.5–5.1)
Sodium: 144 mmol/L (ref 135–145)

## 2020-09-23 LAB — LACTATE DEHYDROGENASE: LDH: 1841 U/L — ABNORMAL HIGH (ref 98–192)

## 2020-09-23 LAB — GLUCOSE, CAPILLARY
Glucose-Capillary: 127 mg/dL — ABNORMAL HIGH (ref 70–99)
Glucose-Capillary: 149 mg/dL — ABNORMAL HIGH (ref 70–99)
Glucose-Capillary: 178 mg/dL — ABNORMAL HIGH (ref 70–99)
Glucose-Capillary: 183 mg/dL — ABNORMAL HIGH (ref 70–99)
Glucose-Capillary: 196 mg/dL — ABNORMAL HIGH (ref 70–99)
Glucose-Capillary: 221 mg/dL — ABNORMAL HIGH (ref 70–99)
Glucose-Capillary: 227 mg/dL — ABNORMAL HIGH (ref 70–99)

## 2020-09-23 LAB — COOXEMETRY PANEL
Carboxyhemoglobin: 1 % (ref 0.5–1.5)
Carboxyhemoglobin: 1.3 % (ref 0.5–1.5)
Carboxyhemoglobin: 1.2 % (ref 0.5–1.5)
Carboxyhemoglobin: 1.1 % (ref 0.5–1.5)
Methemoglobin: 1 % (ref 0.0–1.5)
Methemoglobin: 1.2 % (ref 0.0–1.5)
Methemoglobin: 1.1 % (ref 0.0–1.5)
Methemoglobin: 1.4 % (ref 0.0–1.5)
O2 Saturation: 44.8 %
O2 Saturation: 44.4 %
O2 Saturation: 48.4 %
O2 Saturation: 44.4 %
Total hemoglobin: 9.9 g/dL — ABNORMAL LOW (ref 12.0–16.0)
Total hemoglobin: 8.3 g/dL — ABNORMAL LOW (ref 12.0–16.0)
Total hemoglobin: 8.3 g/dL — ABNORMAL LOW (ref 12.0–16.0)
Total hemoglobin: 8.8 g/dL — ABNORMAL LOW (ref 12.0–16.0)

## 2020-09-23 LAB — CK: Total CK: 32320 U/L — ABNORMAL HIGH (ref 49–397)

## 2020-09-23 LAB — POCT I-STAT 7, (LYTES, BLD GAS, ICA,H+H)
Acid-Base Excess: 1 mmol/L (ref 0.0–2.0)
Bicarbonate: 23.3 mmol/L (ref 20.0–28.0)
Calcium, Ion: 1.07 mmol/L — ABNORMAL LOW (ref 1.15–1.40)
HCT: 18 % — ABNORMAL LOW (ref 39.0–52.0)
Hemoglobin: 6.1 g/dL — CL (ref 13.0–17.0)
O2 Saturation: 99 %
Patient temperature: 97.7
Potassium: 3.2 mmol/L — ABNORMAL LOW (ref 3.5–5.1)
Sodium: 149 mmol/L — ABNORMAL HIGH (ref 135–145)
TCO2: 24 mmol/L (ref 22–32)
pCO2 arterial: 27.2 mmHg — ABNORMAL LOW (ref 32.0–48.0)
pH, Arterial: 7.54 — ABNORMAL HIGH (ref 7.350–7.450)
pO2, Arterial: 113 mmHg — ABNORMAL HIGH (ref 83.0–108.0)

## 2020-09-23 LAB — CBC
HCT: 25.8 % — ABNORMAL LOW (ref 39.0–52.0)
Hemoglobin: 8.1 g/dL — ABNORMAL LOW (ref 13.0–17.0)
MCH: 30.3 pg (ref 26.0–34.0)
MCHC: 31.4 g/dL (ref 30.0–36.0)
MCV: 96.6 fL (ref 80.0–100.0)
Platelets: 59 10*3/uL — ABNORMAL LOW (ref 150–400)
RBC: 2.67 MIL/uL — ABNORMAL LOW (ref 4.22–5.81)
RDW: 18.6 % — ABNORMAL HIGH (ref 11.5–15.5)
WBC: 37.2 10*3/uL — ABNORMAL HIGH (ref 4.0–10.5)
nRBC: 2.8 % — ABNORMAL HIGH (ref 0.0–0.2)

## 2020-09-23 LAB — HEPARIN LEVEL (UNFRACTIONATED): Heparin Unfractionated: 0.4 IU/mL (ref 0.30–0.70)

## 2020-09-23 MED ORDER — PRISMASOL BGK 0/2.5 32-2.5 MEQ/L EC SOLN
Status: DC
  Filled 2020-09-23 (×16): qty 5000

## 2020-09-23 MED ORDER — SODIUM CHLORIDE 0.9 % IV SOLN: INTRAVENOUS | Status: DC | PRN

## 2020-09-23 MED ORDER — METHYLPREDNISOLONE SODIUM SUCC 40 MG IJ SOLR
40.0000 mg | Freq: Two times a day (BID) | INTRAMUSCULAR | Status: DC
  Administered 2020-09-23 – 2020-09-24 (×2): 40 mg via INTRAVENOUS
  Filled 2020-09-23 (×2): qty 1

## 2020-09-23 MED ORDER — PRISMASOL BGK 0/2.5 32-2.5 MEQ/L EC SOLN
Status: DC
  Filled 2020-09-23 (×25): qty 5000

## 2020-09-23 MED ORDER — PRISMASOL BGK 4/2.5 32-4-2.5 MEQ/L EC SOLN
Status: DC
  Filled 2020-09-23 (×10): qty 5000

## 2020-09-23 MED ORDER — FUROSEMIDE 10 MG/ML IJ SOLN
100.0000 mg | Freq: Once | INTRAVENOUS | Status: DC
  Filled 2020-09-23: qty 10

## 2020-09-23 MED ORDER — NOREPINEPHRINE 16 MG/250ML-% IV SOLN
0.0000 ug/min | INTRAVENOUS | Status: DC
  Administered 2020-09-23: 5 ug/min via INTRAVENOUS
  Administered 2020-09-24 – 2020-09-28 (×7): 15 ug/min via INTRAVENOUS
  Administered 2020-09-29: 10 ug/min via INTRAVENOUS
  Administered 2020-09-30: 15 ug/min via INTRAVENOUS
  Administered 2020-10-01 (×2): 35 ug/min via INTRAVENOUS
  Administered 2020-10-02 (×3): 40 ug/min via INTRAVENOUS
  Filled 2020-09-23 (×14): qty 250

## 2020-09-23 MED ORDER — INSULIN DETEMIR 100 UNIT/ML ~~LOC~~ SOLN
15.0000 [IU] | Freq: Two times a day (BID) | SUBCUTANEOUS | Status: DC
  Administered 2020-09-23 – 2020-10-01 (×17): 15 [IU] via SUBCUTANEOUS
  Filled 2020-09-23 (×18): qty 0.15

## 2020-09-23 MED ORDER — HEPARIN SODIUM (PORCINE) 1000 UNIT/ML DIALYSIS
1000.0000 [IU] | INTRAMUSCULAR | Status: DC | PRN
  Filled 2020-09-23: qty 3
  Filled 2020-09-23: qty 6

## 2020-09-23 NOTE — Progress Notes (Deleted)
NAME:  Dennis Howard., MRN:  [id number], DOB:  [birth year], LOS: 10 ADMISSION DATE:  09/15/2020, CONSULTATION DATE: 09/15/2020 REFERRING MD: Wilkie Aye - EM CHIEF COMPLAINT:  Altered Mental Status  History of Present Illness:  55 yo male former smoker presented with altered mental status that progressed for one week prior to admission.  This was associated with lower leg swelling with blisters, weight gain, dyspnea, and cough.  Also had progressive muscle weakness.  Had recent outpt treatment for pneumonia with doxycycline.  Pertinent  Medical History  CKD III - sr cr 2013 1.6, HTN, Bronchitis, THC Use    Significant Hospital Events: Including procedures, antibiotic start and stop dates in addition to other pertinent events   . 4/15 Admitted to ICU. Treated with azithro, ceftriaxone, flagyl, unasyn. Tylenol, acetaminophen, ETOH, influenza, COVID, hepatitis, urine strep antigen, legionella, & UDS negative.  . 4/16 ECHO with global hypokinesis, anteroseptal wall and apex are akinetic, anterior wall severely hypokinetic, very large layered semi-mobile apical thrombus extending from the apex along the anteroseptal wall, LVEF ~15-20%, RV systolic function mild to moderately reduced.  tarted on dobuatmine drip. ANA negative. Blood cultures negative.  . 4/17 Unasyn stopped, cefepime initiated  . 4/18 Dobutamine at , net neg 1.7L, tolerating soft diet, no n/v . 4/19 Dobutamine increased to 7.93mcg . 4/20 MRI Brain >> 2mm acute infarction at the inferior cerebellum on left and 77mm acute infarction at the left parietooccipital junction. Subacute ischemic changes of the cortical subcortical brain. . 4/22 Thigh hematoma post biopsy . 4/23 Dobutamine at 6.28mcg, pt on heparin gtt, PPI gtt. Muscle biopsy pending.  . 4/24 dark colored bowel movement, hemoglobin stable  Interim History / Subjective:  No acute events overnight Creatinine worse this morning UOP in the last 24 hours Dobutamine  7.5  Objective   Blood pressure 116/85, pulse (!) 108, temperature (P) 98 F (36.7 C), temperature source (P) Oral, resp. rate 10, height 6' (1.829 m), weight 80.3 kg, SpO2 100 %. CVP:  [8 mmHg-12 mmHg] 12 mmHg      Intake/Output Summary (Last 24 hours) at 09/23/2020 1023 Last data filed at 09/23/2020 0093 Gross per 24 hour  Intake 1556.97 ml  Output 400 ml  Net 1156.97 ml   Filed Weights   09/21/20 0500 09/22/20 0130 09/23/20 0446  Weight: 76.4 kg 80.3 kg 80.3 kg    Examination:  General:   Middle aged male of normal body habitus in NAD Neuro:  Somnolent, will arouse to verbal and briefly respond to questions appropriately. Extremities weak.  HEENT:  [place]/AT, PERRL Cardiovascular:  RRR, no MRG, Bilateral lower extremity edema + 3 pitting.  Lungs:  Clear bilateral breath sounds. No distress Abdomen:  Soft, non-distended, nontender Musculoskeletal:  Left thigh is tight, distal pulses intact. Skin:  Intact, MMM  Resolved Hospital Problem list   Encephalopathy  Assessment & Plan:   Diffuse muscle weakness with elevated CPK and aldolase - main concern for polymyositis - transitioned to prednisone 40 mg daily on 4/24; continue this dose for next several weeks before considering dose reduction - 4/21 muscle biopsy still pending, should be back today per hematology  Acute systolic CHF with cardiogenic shock LV thrombus Hx of HTN, HLD - continue dobutamine, heparin gtt - heart failure team following - hold outpt lipitor, norvasc, lisinopril - will need LHC/RHC at some point   Pulmonary infiltrates: completed ABX 4/23 - infection versus inflammatory - continue solumedrol - f/u CXR intermittently   Anemia, thrombocytopenia.  Melanotic stool:  Hgb slowly falling. 8.1 today.  - hematology following. Less likely TTP. Low suspicion for hemolytic anemia.  - f/u CBC - Needing to keep heparin due to CVA and LV thrombus - Close monitoring   Steroid induced hyperglycemia - SSI  resistant scale with tube feed coverage at 4 units q4h, levemir to 15 units bid   AKI, Hypernatremia.  CKD 3B - f/u BMET - monitor urine outpt - Spoke with nephrology who are recommending aggressive diuresis today and if no effect will need to re-consult for CRRT   Dysphagia Moderate protein calorie malnutrition - continue tube feeds   Acute embolic CVA - Neurology has signed off - Will need MRA brain and carotid ultrasound once more stable.  - Call neurology back when muscle biopsy returns.   Elevated LFTs. Congestive Hepatopathy -follow LFT trend   Thrush - improved - magic mouth wash prn  Best practice (right click and "Reselect all SmartList Selections" daily)  Diet:  Tube Feed , PO diet as tolerated Pain/Anxiety/Delirium protocol (if indicated): No VAP protocol (if indicated): Not indicated DVT prophylaxis: Systemic AC GI prophylaxis: PPI Glucose control:  SSI No Central venous access:  Yes, and it is still needed Arterial line:  N/A Foley:  N/A,  Mobility:  bed rest  PT consulted: N/A Last date of multidisciplinary goals of care discussion - ongoing Code Status:  full code Disposition: ICU  Labs:   CMP Latest Ref Rng & Units 09/23/2020 09/22/2020 09/21/2020  Glucose 70 - 99 mg/dL 199(V) 444(P) 848(L)  BUN 6 - 20 mg/dL 507(D) 732(Q) 567(C)  Creatinine 0.61 - 1.24 mg/dL 0.91(Z) 8.02(C) 1.79(G)  Sodium 135 - 145 mmol/L 144 147(H) 149(H)  Potassium 3.5 - 5.1 mmol/L 5.2(H) 4.4 4.1  Chloride 98 - 111 mmol/L 112(H) 116(H) 114(H)  CO2 22 - 32 mmol/L 21(L) 23 25  Calcium 8.9 - 10.3 mg/dL 7.1(L) 7.0(L) 7.1(L)  Total Protein 6.5 - 8.1 g/dL 4.7(L) - 4.3(L)  Total Bilirubin 0.3 - 1.2 mg/dL 1.0(Y) - 1.7(H)  Alkaline Phos 38 - 126 U/L 172(H) - 183(H)  AST 15 - 41 U/L 412(H) - 285(H)  ALT 0 - 44 U/L 493(H) - 305(H)    CBC Latest Ref Rng & Units 09/23/2020 09/22/2020 09/22/2020  WBC 4.0 - 10.5 K/uL 37.2(H) - 40.4(H)  Hemoglobin 13.0 - 17.0 g/dL 8.1(L) 8.1(L) 8.2(L)   Hematocrit 39.0 - 52.0 % 25.8(L) 25.0(L) 25.6(L)  Platelets 150 - 400 K/uL 59(L) - 77(L)    ABG    Component Value Date/Time   PHART 7.540 (H) 09/19/2020 1650   PCO2ART 27.2 (L) 09/19/2020 1650   PO2ART 113 (H) 09/19/2020 1650   HCO3 23.3 09/19/2020 1650   TCO2 24 09/19/2020 1650   ACIDBASEDEF 1.8 09/14/2020 0031   O2SAT 44.8 09/23/2020 0456    CBG (last 3)  Recent Labs    09/23/20 0056 09/23/20 0357 09/23/20 0810  GLUCAP 227* 221* 196*    Critical care time: 45 minutes    Joneen Roach, AGACNP-BC [facility]  See Amion for personal pager PCCM on call pager [phone] until 7pm Please call Elink 7p-7a. [phone]  09/23/2020 10:23 AM

## 2020-09-23 NOTE — Progress Notes (Signed)
  Speech Language Pathology Treatment: Dysphagia  Patient Details Name: Dennis Howard. MRN: [id number] DOB: [birth year] Today's Date: 09/23/2020 Time: 1350-1400 SLP Time Calculation (min) (ACUTE ONLY): 10 min  Assessment / Plan / Recommendation Clinical Impression  RN reports pt with no appetite, getting tube feeding and meds per tube primarily, but still enjoying sips of thin liquids occasionally. SLP visited mother and pt and bedside and reinforced aspiration precautions given pts altered mental status with total assisted feeding. Mother fed pt sips of Boost in upright position, but allowed consecutive sips with subsequent minor throat clearing and cough. When pt allowed 2-3 sips via straw with total assisted timing, cough eliminated. Mother verbalized understanding. Will f/u for further tolerance given limited intake today.   HPI HPI: 55 yo man with a past medical history of chronic kidney disease, hypertension who presented to [facility] on 4/15 with CC AMS. MRI of brain showed 5 mm acute infarction at the inferior cerebellum on the left. 6 mm acute infarction at the left parietooccipital junction. Subacute ischemic changes of the cortical and subcortical brain at the left parietooccipital junction. Pt pending additional workup for neuromuscular disorder.      SLP Plan  Continue with current plan of care       Recommendations  Diet recommendations: Thin liquid Liquids provided via: Straw Medication Administration: Via alternative means Supervision: Trained caregiver to feed patient;Staff to assist with self feeding Compensations: Slow rate;Small sips/bites Postural Changes and/or Swallow Maneuvers: Seated upright 90 degrees                Follow up Recommendations: 24 hour supervision/assistance SLP Visit Diagnosis: Dysphagia, unspecified (R13.10) Plan: Continue with current plan of care       GO               [name], MA CCC-SLP  Acute Rehabilitation  Services Pager [phone] Office [phone]  Claudine Mouton 09/23/2020, 3:03 PM

## 2020-09-23 NOTE — Progress Notes (Signed)
PT Cancellation Note  Patient Details Name: Dennis Howard. MRN: [id number] DOB: [birth year]   Cancelled Treatment:    Reason Eval/Treat Not Completed: Medical issues which prohibited therapy  Spoke with Crystal, RN. Pt very lethargic, has recently had to increase Levophed, and left thigh more edematous. Recommended and agree with defer PT at this time.    Jerolyn Center, PT Pager [phone]  Zena Amos 09/23/2020, 1:46 PM

## 2020-09-23 NOTE — Progress Notes (Addendum)
Advanced Heart Failure Rounding Note  PCP-Cardiologist: No primary care provider on file.    Patient Profile   55 y/o male w/ no prior cardiac history, admitted for mixed cardiogenic + septic shock in setting of PNA + hepatic encephalopathy.   ECHO LVEF 15% with severe global HK with regional variability + LV clot RV moderately down. Initial Co-ox 48%. Started on DBA.    Subjective:    Remains on DBA 6.5 mcg/kg/min. CO-OX 45%. Repeat pending.   Remains lethargic but will follow commands. No complaints.   Also developed thigh hematoma. Received 4 UPRBCs. Hgb 8.1    Creatinine trending back up. 1.6->2.0 -> 2.3 ->3.6. Diuretics on hold  Results of muscle biopsy still pending. Remains on steroids without appreciable change in mental status or strength.   Bili and LFTs trending up.    Objective:   Weight Range: 80.3 kg Body mass index is 24.01 kg/m.   Vital Signs:   Temp:  [97.9 F (36.6 C)-98.3 F (36.8 C)] (P) 98 F (36.7 C) (04/25 0800) Pulse Rate:  [105-114] 108 (04/25 0700) Resp:  [0-28] 10 (04/25 0900) BP: (114-139)/(79-96) 116/85 (04/25 0900) SpO2:  [91 %-100 %] 100 % (04/25 0700) Weight:  [80.3 kg] 80.3 kg (04/25 0446) Last BM Date: 09/22/20  Weight change: Filed Weights   09/21/20 0500 09/22/20 0130 09/23/20 0446  Weight: 76.4 kg 80.3 kg 80.3 kg    Intake/Output:   Intake/Output Summary (Last 24 hours) at 09/23/2020 0934 Last data filed at 09/23/2020 0714 Gross per 24 hour  Intake 1583.63 ml  Output 400 ml  Net 1183.63 ml      Physical Exam  CVP 11-12.  General:  Appears weak. No resp difficulty HEENT: [place]  Neck: supple. JVP 11-12. Carotids 2+ bilat; no bruits. No lymphadenopathy or thryomegaly appreciated. Cor: PMI nondisplaced. Tachy Regular rate & rhythm. No rubs, gallops or murmurs. Lungs: clear Abdomen: soft, nontender, nondistended. No hepatosplenomegaly. No bruits or masses. Good bowel sounds. Extremities: no cyanosis, clubbing,  rash, edema Neuro: alert & orientedx3, cranial nerves grossly intact. moves all 4 extremities w/o difficulty. Affect flat   Telemetry   Sinus Tach 100s. Personally reviewed.   Labs    CBC Recent Labs    09/22/20 0242 09/22/20 1618 09/23/20 0456  WBC 40.4*  --  37.2*  HGB 8.2* 8.1* 8.1*  HCT 25.6* 25.0* 25.8*  MCV 94.8  --  96.6  PLT 77*  --  59*   Basic Metabolic Panel Recent Labs    33/54/56 0242 09/23/20 0456  NA 147* 144  K 4.4 5.2*  CL 116* 112*  CO2 23 21*  GLUCOSE 289* 216*  BUN 117* 143*  CREATININE 2.91* 3.56*  CALCIUM 7.0* 7.1*   Liver Function Tests Recent Labs    09/21/20 0604 09/23/20 0456  AST 285* 412*  ALT 305* 493*  ALKPHOS 183* 172*  BILITOT 1.7* 1.8*  PROT 4.3* 4.7*  ALBUMIN 1.6* 1.8*   No results for input(s): LIPASE, AMYLASE in the last 72 hours. Cardiac Enzymes No results for input(s): CKTOTAL, CKMB, CKMBINDEX, TROPONINI in the last 72 hours.  BNP: BNP (last 3 results) Recent Labs    09/14/2020 2114 09/14/20 0031 09/15/20 0321  BNP 1,447.9* 1,523.7* 743.8*    ProBNP (last 3 results) No results for input(s): PROBNP in the last 8760 hours.   D-Dimer No results for input(s): DDIMER in the last 72 hours. Hemoglobin A1C No results for input(s): HGBA1C in the last 72 hours. Fasting  Lipid Panel No results for input(s): CHOL, HDL, LDLCALC, TRIG, CHOLHDL, LDLDIRECT in the last 72 hours. Thyroid Function Tests No results for input(s): TSH, T4TOTAL, T3FREE, THYROIDAB in the last 72 hours.  Invalid input(s): FREET3  Other results:   Imaging    No results found.   Medications:     Scheduled Medications: . capsaicin   Topical BID  . Chlorhexidine Gluconate Cloth  6 each Topical Daily  . feeding supplement  1 Container Oral TID BM  . feeding supplement (PROSource TF)  45 mL Per Tube TID  . free water  200 mL Per Tube Q4H  . insulin aspart  0-20 Units Subcutaneous Q4H  . insulin aspart  4 Units Subcutaneous Q4H  .  insulin detemir  10 Units Subcutaneous BID  . lidocaine   Topical BID  . magic mouthwash  10 mL Oral TID  . multivitamin with minerals  1 tablet Oral Daily  . pantoprazole  40 mg Intravenous Q12H  . predniSONE  40 mg Oral Q breakfast  . sodium chloride flush  10-40 mL Intracatheter Q12H  . thiamine injection  100 mg Intravenous Daily    Infusions: . DOBUTamine 6.5 mcg/kg/min (09/23/20 0700)  . feeding supplement (OSMOLITE 1.5 CAL) 1,000 mL (09/22/20 2107)  . heparin 850 Units/hr (09/23/20 0700)    PRN Medications: docusate, oxyCODONE, sodium chloride flush    Patient Profile   55 y/o male w/ no prior cardiac history, admitted for mixed cardiogenic + septic shock in setting of PNA + hepatic encephalopathy.   ECHO LVEF 15% with severe global HK with regional variability + LV clot RV moderately down. Initial Co-ox 48%. Started on DBA.    Assessment/Plan   1. Cardiogenic shock with multisystem organ failure - ECHO LVEF 15% with severe global HK with regional variability + LV clot RV moderately down. - Remains on DBA 6.5. CO-OX 45%. Repeat CO-OX now.  -CVP 11-12. Worsening renal function.  - Unifying diagnosis remains unclear. Serologies unrevealing so far. Await result of muscle biopsy.  - will need R/L cath if/when he recovers  2. LV thrombus - ON heparin drip. Hgb 8.1 today   3. Shock liver with hepatic encephalopathy - Suspect due to low output HF. Transaminases improving with hemodynamics support - Liver u/s suggestive of hepatic steatosis but not long-term cirrhosis. Though low albumin and low PLTs suggestive of more longstanding liver process  - continue supportive care  4. AKI due to ATN/shock - Baseline creatinine unknown  - SCr 1.5 in 2016 in Care Everywhere - Creatinine  Up to 3.6 today. Urine output trending down.  - Nephrology consulted.   5. CAP - PCT 3.70 -> 2.9 - BC NGTD  -Afebrile,  WBC 37 , on steroids - was on cefepime and ancef. Off  antibiotics.    6. Rhabdomyolysis/myositis with hematologic abnormalties - CK 6502>4025>pending.  - muscle biopsy pending  - suspect underlying inflammatory process but serologies negative so far - On prednisone.   7. ABLA - likely due to left thigh hematoma +/- GI bleed - tranfuse - PPI - CT- seroma versus possible abscess.  - hold heparin for now  9. Hypernatremia  - Sodium 144 today.   10. Elevated LFTS LFTs trending up   11. Thrombocytopenia PLTs trending down 77>59   Length of Stay: 10  Tonye Becket, NP  09/23/2020, 9:34 AM  Advanced Heart Failure Team Pager [phone] (M-F; 7a - 5p)  Please contact [facility] for night-coverage after hours (5p -7a )  and weekends on [email or web address]  Agree with above.   He remains critically ill with MSOF. Very weak. Can barely open his eyes. Denies SOB or CP. Co-ox 44% CVp 11  Off heparin due to thigh hematoma. Creatinine rising  General:  Weak and ill appearing. No resp difficulty HEENT: normal Neck: supple. JVP to jaw + HF cath  Carotids 2+ bilat; no bruits. No lymphadenopathy or thryomegaly appreciated. Cor: PMI nondisplaced. Tachy regular +s3 Lungs: clear Abdomen: soft, nontender, nondistended. No hepatosplenomegaly. No bruits or masses. Good bowel sounds. Extremities: no cyanosis, clubbing, rash,3+ edema  Left high hematoma Neuro: lethargic weak   He remains critically ill. Weakness seems far out of proportion to degree of HF but co-ox persistently low despite DBA. Not candidate for Impella with large LV thrombus. Will add NE and try to titrate to co-ox > 55%. Agree with attempts at diuresis. Await results of muscle biopsy. I again worry that we have not found the unifying diagnosis. Has not benefited from steroids and serologies unrevealing. Will continue to treat symptomatically. May need CVVHD. D/w CCM at bedside.   CRITICAL CARE Performed by: Arvilla Meres  Total critical care time: 40 minutes  Critical care time was  exclusive of separately billable procedures and treating other patients.  Critical care was necessary to treat or prevent imminent or life-threatening deterioration.  Critical care was time spent personally by me (independent of midlevel providers or residents) on the following activities: development of treatment plan with patient and/or surrogate as well as nursing, discussions with consultants, evaluation of patient's response to treatment, examination of patient, obtaining history from patient or surrogate, ordering and performing treatments and interventions, ordering and review of laboratory studies, ordering and review of radiographic studies, pulse oximetry and re-evaluation of patient's condition.  Arvilla Meres, MD  11:43 AM

## 2020-09-23 NOTE — Progress Notes (Addendum)
IP PROGRESS NOTE  Subjective:   Dennis Howard is resting quietly.  Does not offer any complaints today.  Opens eyes and nods yes/no.  Medical reports concern for hematoma at muscle biopsy site.  Objective: Vital signs in last 24 hours: Blood pressure 114/84, pulse (!) 104, temperature 98 F (36.7 C), temperature source Oral, resp. rate (!) 6, height 6' (1.829 m), weight 80.3 kg, SpO2 100 %.  Intake/Output from previous day: 04/24 0701 - 04/25 0700 In: 1637 [I.V.:577; NG/GT:1060] Out: -   Physical Exam:  HEENT: no bleeding, nasal feeding tube in place Abdomen: Soft, no hepatosplenomegaly, nontender Extremities: Trace edema at the lower leg bilaterally, few "blister "lesions at the lower legs, light purplish discoloration at the great toe bilaterally Musculoskeletal: There is swelling at the left mid and upper thigh Neurologic: Alert, follows simple commands, moves all extremities to command, oriented to place and year  Right IJ catheter site without evidence of infection or bleeding  Lab Results: Recent Labs    09/22/20 0242 09/22/20 1618 09/23/20 0456  WBC 40.4*  --  37.2*  HGB 8.2* 8.1* 8.1*  HCT 25.6* 25.0* 25.8*  PLT 77*  --  59*  ADAMTS13 on 09/17/2020 - 38.2% Blood smear 09/20/2020: The platelets are decreased in number, no platelet clumps.  There is a leukocytosis.  The majority the white cells are mature neutrophils.  No blast or other young forms are seen.  Few nucleated red cells.  The polychromasia is increased.  There are burr cells, target cells, acanthocytes, and a few schistocytes. BMET Recent Labs    09/22/20 0242 09/23/20 0456  NA 147* 144  K 4.4 5.2*  CL 116* 112*  CO2 23 21*  GLUCOSE 289* 216*  BUN 117* 143*  CREATININE 2.91* 3.56*  CALCIUM 7.0* 7.1*   Bilirubin 1.6, LDH 594 No results found for: CEA1  Studies/Results: No results found.  Medications: I have reviewed the patient's current medications.  Assessment/Plan: 1.  Pulmonary  infiltrates 2.  Chronic/acute renal failure 3.  Cardiogenic shock on hospital admission 4.  Liver failure secondary to cardiogenic shock, underlying chronic liver disease? 5.  Anemia 6.  Thrombocytopenia 7.   Coagulopathy 8.   Altered mental status-hepatic encephalopathy? 9.   Rhabdomyolysis 10.  Oral candidiasis 11.  Muscle weakness-status post a muscle biopsy 09/19/2020 12.  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 appears unchanged from prior exam.  He responds to questions with nods.  I continue to have a low suspicion for hemolysis.  The bilirubin is lower and he is not mounting a reticulocytosis.  His LDH is significantly more elevated today which is nonspecific, however he also has significantly elevated CK today which is most consistent with rhabdomyolysis.  Platelet count is slightly lower today. The hematologic findings are likely related to a systemic inflammatory condition with bone marrow suppression.  The creatinine continues to increase.  Recommend nephrology consult.  Muscle biopsy results are not yet available.  Recommendations:  1.  Transfuse packed red blood cells as needed 2.  Daily CBC 3.  Follow-up muscle biopsy pathology 4.  Steroids and anticoagulation as recommended by the critical care and neurology services 5.  Recommend nephrology consult 6.  Hematology will continue to follow, please call with any questions or concerns 7. Low suspicion that his findings are driven by a primary hematological process. 8.  Contact outside consultant to discuss the muscle biopsy pathology   Clenton Pare, DNP, AGPCNP-BC, AOCNP  LOS: 10 days  [name] was examined.  I discussed the case with Dr. Gaynell Face.  I reviewed the labs from today and the nephrology consult note.  He has multiorgan failure without a unifying diagnosis.  The CPK and LDH are more elevated today.  The hemoglobin is stable.  The platelet count has  not changed significantly over the past week.  The muscle biopsy pathology is pending.  I contacted pathology and was told the tissue was sent out for consultation and a result will not be back until next week.  He is critically ill.  I continue to feel it is unlikely a primary hematologic process accounts for the multiple clinical and laboratory findings.

## 2020-09-23 NOTE — Progress Notes (Addendum)
NAME:  Dennis Howard., MRN:  [id number], DOB:  07-22-1965, LOS: 10 ADMISSION DATE:  09/10/2020, CONSULTATION DATE: 09/24/2020 REFERRING MD: Wilkie Aye - EM CHIEF COMPLAINT:  Altered Mental Status  History of Present Illness:  55 yo male former smoker presented with altered mental status that progressed for one week prior to admission.  This was associated with lower leg swelling with blisters, weight gain, dyspnea, and cough.  Also had progressive muscle weakness.  Had recent outpt treatment for pneumonia with doxycycline.  Pertinent  Medical History  CKD III - sr cr 2013 1.6, HTN, Bronchitis, THC Use    Significant Hospital Events: Including procedures, antibiotic start and stop dates in addition to other pertinent events   . 4/15 Admitted to ICU. Treated with azithro, ceftriaxone, flagyl, unasyn. Tylenol, acetaminophen, ETOH, influenza, COVID, hepatitis, urine strep antigen, legionella, & UDS negative.  . 4/16 ECHO with global hypokinesis, anteroseptal wall and apex are akinetic, anterior wall severely hypokinetic, very large layered semi-mobile apical thrombus extending from the apex along the anteroseptal wall, LVEF ~15-20%, RV systolic function mild to moderately reduced.  tarted on dobuatmine drip. ANA negative. Blood cultures negative.  . 4/17 Unasyn stopped, cefepime initiated  . 4/18 Dobutamine at , net neg 1.7L, tolerating soft diet, no n/v . 4/19 Dobutamine increased to 7.104mcg . 4/20 MRI Brain >> 12mm acute infarction at the inferior cerebellum on left and 59mm acute infarction at the left parietooccipital junction. Subacute ischemic changes of the cortical subcortical brain. . 4/22 Thigh hematoma post biopsy . 4/23 Dobutamine at 6.31mcg, pt on heparin gtt, PPI gtt. Muscle biopsy pending.  . 4/24 dark colored bowel movement, hemoglobin stable  Interim History / Subjective:  No acute events overnight Creatinine worse this morning UOP in the last 24 hours Dobutamine  7.5  Objective   Blood pressure 123/87, pulse (!) 108, temperature 98.3 F (36.8 C), temperature source Oral, resp. rate 16, height 6' (1.829 m), weight 80.3 kg, SpO2 100 %. CVP:  [8 mmHg-12 mmHg] 12 mmHg      Intake/Output Summary (Last 24 hours) at 09/23/2020 0857 Last data filed at 09/23/2020 7353 Gross per 24 hour  Intake 1610.28 ml  Output 400 ml  Net 1210.28 ml   Filed Weights   09/21/20 0500 09/22/20 0130 09/23/20 0446  Weight: 76.4 kg 80.3 kg 80.3 kg    Examination:  General:   Middle aged male of normal body habitus in NAD Neuro:  Somnolent, will arouse to verbal and briefly respond to questions appropriately. Extremities weak.  HEENT:  [place]/AT, PERRL Cardiovascular:  RRR, no MRG, Bilateral lower extremity edema + 3 pitting.  Lungs:  Clear bilateral breath sounds. No distress Abdomen:  Soft, non-distended, nontender Musculoskeletal:  Left thigh is tight, distal pulses intact. Skin:  Intact, MMM  Resolved Hospital Problem list   Encephalopathy  Assessment & Plan:   Diffuse muscle weakness with elevated CPK and aldolase - main concern for polymyositis - transitioned to prednisone 40 mg daily on 4/24; continue this dose for next several weeks before considering dose reduction - 4/21 muscle biopsy still pending, should be back today per hematology  Acute systolic CHF with cardiogenic shock LV thrombus Hx of HTN, HLD - continue dobutamine, heparin gtt - heart failure team following - hold outpt lipitor, norvasc, lisinopril - will need LHC/RHC at some point   Pulmonary infiltrates: completed ABX 4/23 - infection versus inflammatory - continue solumedrol - f/u CXR intermittently   Anemia, thrombocytopenia.  Melanotic stool: Hgb slowly  falling. 8.1 today.  - hematology following. Less likely TTP. Low suspicion for hemolytic anemia.  - f/u CBC - Needing to keep heparin due to CVA and LV thrombus - Close monitoring   Steroid induced hyperglycemia - SSI  resistant scale with tube feed coverage at 4 units q4h, levemir to 15 units bid   AKI, Hypernatremia.  CKD 3B - f/u BMET - monitor urine outpt - Discussed with nephrology. Awaiting repeat CK. If improved will aggressively dose lasix and gauge response. Will likely need RRT otherwise.    Dysphagia.  Moderate protein calorie malnutrition - continue tube feeds   Acute embolic CVA - Neurology has signed off - Will need MRA brain and carotid ultrasound once more stable.  - Call neurology back when muscle biopsy returns.   Elevated LFTs. Congestive Hepatopathy -follow LFT trend   Thrush - improved - magic mouth wash prn  Best practice (right click and "Reselect all SmartList Selections" daily)  Diet:  Tube Feed , PO diet as tolerated Pain/Anxiety/Delirium protocol (if indicated): No VAP protocol (if indicated): Not indicated DVT prophylaxis: Systemic AC GI prophylaxis: PPI Glucose control:  SSI No Central venous access:  Yes, and it is still needed Arterial line:  N/A Foley:  N/A,  Mobility:  bed rest  PT consulted: N/A Last date of multidisciplinary goals of care discussion - ongoing Code Status:  full code Disposition: ICU  Labs:   CMP Latest Ref Rng & Units 09/23/2020 09/22/2020 09/21/2020  Glucose 70 - 99 mg/dL 619(J) 093(O) 671(I)  BUN 6 - 20 mg/dL 458(K) 998(P) 382(N)  Creatinine 0.61 - 1.24 mg/dL 0.53(Z) 7.67(H) 4.19(F)  Sodium 135 - 145 mmol/L 144 147(H) 149(H)  Potassium 3.5 - 5.1 mmol/L 5.2(H) 4.4 4.1  Chloride 98 - 111 mmol/L 112(H) 116(H) 114(H)  CO2 22 - 32 mmol/L 21(L) 23 25  Calcium 8.9 - 10.3 mg/dL 7.1(L) 7.0(L) 7.1(L)  Total Protein 6.5 - 8.1 g/dL 4.7(L) - 4.3(L)  Total Bilirubin 0.3 - 1.2 mg/dL 7.9(K) - 1.7(H)  Alkaline Phos 38 - 126 U/L 172(H) - 183(H)  AST 15 - 41 U/L 412(H) - 285(H)  ALT 0 - 44 U/L 493(H) - 305(H)    CBC Latest Ref Rng & Units 09/23/2020 09/22/2020 09/22/2020  WBC 4.0 - 10.5 K/uL 37.2(H) - 40.4(H)  Hemoglobin 13.0 - 17.0 g/dL  8.1(L) 8.1(L) 8.2(L)  Hematocrit 39.0 - 52.0 % 25.8(L) 25.0(L) 25.6(L)  Platelets 150 - 400 K/uL 59(L) - 77(L)    ABG    Component Value Date/Time   PHART 7.540 (H) 09/19/2020 1650   PCO2ART 27.2 (L) 09/19/2020 1650   PO2ART 113 (H) 09/19/2020 1650   HCO3 23.3 09/19/2020 1650   TCO2 24 09/19/2020 1650   ACIDBASEDEF 1.8 09/14/2020 0031   O2SAT 44.8 09/23/2020 0456    CBG (last 3)  Recent Labs    09/23/20 0056 09/23/20 0357 09/23/20 0810  GLUCAP 227* 221* 196*    Critical care time: 45 minutes    Joneen Roach, AGACNP-BC [facility]  See Amion for personal pager PCCM on call pager [phone] until 7pm Please call Elink 7p-7a. [phone]  09/23/2020 10:00 AM

## 2020-09-23 NOTE — Progress Notes (Addendum)
ANTICOAGULATION CONSULT NOTE - Follow Up Consult  Pharmacy Consult for IV Heparin Indication: LV mural thrombus  Heparin Dosing Weight:  77.4 kg  Labs: Recent Labs    09/21/20 0604 09/22/20 0242 09/22/20 1330 09/22/20 1618 09/22/20 2120 09/23/20 0456  HGB 8.7* 8.2*  --  8.1*  --  8.1*  HCT 26.1* 25.6*  --  25.0*  --  25.8*  PLT 79* 77*  --   --   --  59*  HEPARINUNFRC 0.36 0.54 0.50  --  0.46 0.40  CREATININE 2.66* 2.91*  --   --   --  3.56*   Assessment: 55 yr old man presented with AMS. Pharmacy was consulted for heparin for NSTEMI; pt was on no anticoagulants PTA. H/H ok on admission, plt low at 121.  Held heparin on 4/18, patient now S/P muscle biopsy. Per Dr. Sheliah Hatch, resumed heparin at 1000 4/20.  MRI brain on 4/20 with multiple acute infarcts and subacute ischemic changes. Per Neurology, will target low end of goal range  On 4/21 in PM patient had large black/dark stool, FOBT+. Hgb 5.3 and transfused 4 units PRBC total. CCM held heparin since 4/22 . Patient with L thigh hematoma, per surgery CT scan indicative of post-op hematoma, no indication for drainage. Heparin to be  reintroduced cautiously.   Heparin level this morning came back within goal range at 0.4, on 850 units/hr. Hgb 8.1 this am, plt 59. No infusion issues. Bowel movements more green colored from rectal tube- stable but not worsening. LFTs worsening.   Goal of Therapy:  Heparin level 0.3-0.5 units/ml; no bolus Monitor platelets by anticoagulation protocol: Yes   Plan:  Decrease heparin to 850 units/hr  Monitor daily heparin level, CBC, platelets Monitor for signs/symptoms of bleeding  Sheppard Coil PharmD., BCPS Clinical Pharmacist 09/23/2020 9:42 AM  Please check AMION for all [facility] phone numbers After 10:00 PM, call Main Pharmacy [phone]

## 2020-09-23 NOTE — Procedures (Addendum)
Central Venous Catheter Insertion Procedure Note  Dennis Howard  [id number]  Feb 09, 1966  Date:09/23/20  Time:4:31 PM   Provider Performing:Delane Stalling Lacretia Nicks Mikey Bussing   Procedure: Insertion of Non-tunneled Central Venous Catheter(36556)with US guidance (65784)    Indication(s) Hemodialysis  Consent Risks of the procedure as well as the alternatives and risks of each were explained to the patient and/or caregiver.  Consent for the procedure was obtained and is signed in the bedside chart  Anesthesia Topical only with 1% lidocaine   Timeout Verified patient identification, verified procedure, site/side was marked, verified correct patient position, special equipment/implants available, medications/allergies/relevant history reviewed, required imaging and test results available.  Sterile Technique Maximal sterile technique including full sterile barrier drape, hand hygiene, sterile gown, sterile gloves, mask, hair covering, sterile ultrasound probe cover (if used).  Procedure Description Area of catheter insertion was cleaned with chlorhexidine and draped in sterile fashion.   With real-time ultrasound guidance a HD catheter was placed into the left internal jugular vein.  Nonpulsatile blood flow and easy flushing noted in all ports.  The catheter was sutured in place and sterile dressing applied.  Complications/Tolerance None; patient tolerated the procedure well. Chest X-ray is ordered to verify placement for internal jugular or subclavian cannulation.  Chest x-ray is not ordered for femoral cannulation.  EBL Minimal  Specimen(s) None       Joneen Roach, AGACNP-BC [facility]  See Amion for personal pager PCCM on call pager [phone] until 7pm. Please call Elink 7p-7a. [phone]  09/23/2020 4:32 PM

## 2020-09-23 NOTE — Consult Note (Signed)
[facility] Nephrology Consultation Note  Requesting MD: Dr Ruthann Cancer, Janett Billow Reason for consult: AKI  HPI:  Dennis Howard. is a 55 y.o. male with history of hypertension, CKD was admitted on 4/15 with altered mental status, found to have systolic CHF, respiratory failure required ECMO, rhabdomyolysis, now seen as a consultation for the evaluation of acute kidney injury. As per patient's mother, he developed cough and respiratory symptoms therefore went to ER.  He was oral antibiotics without any improvement.  He presented back with worsening symptoms including generalized fatigue and altered mental status.  Also developed lower extremity swelling, shortness of breath, coughing.  In the ER the potassium level was 6.7, creatinine level 2.26, elevated liver enzymes and serum ammonia level 65.  He was admitted to ICU.  Treated with broad-spectrum antibiotics.  The echocardiogram showed global hypokinesis, apical seen by mobile thrombus with EF of 15 to 20% and severely reduced RV function.  He was a started on dobutamine drip and ECMO.  The MRI of the brain showed acute infarction of the brain.  Because of the generalized muscle weakness and pain he underwent thigh muscle biopsy complicated by hematoma.  He was initially treated with IV diuretics with good response.  The serum creatinine level gradually started going up to 3.56 today with decreasing urine output. Today the labs showed potassium 5.2, BUN 143, creatinine level 3.56 and CK level came back 32,000.  The WBC count is elevated to 37.2K. In addition to diuretics, inotropes and antibiotics he was also started on steroid for possible polymyositis. Currently patient is lying on bed, encephalopathic and he is only able to open eyes with the name but not really able to follow commands.  Blood pressure is acceptable with Levophed and dobutamine. Urinalysis was bland on admission.  The kidney ultrasound without hydronephrosis however has  minimal bladder distention.  Patient's mother is at bedside.  Creatinine, Ser  Date/Time Value Ref Range Status  09/23/2020 04:56 AM 3.56 (H) 0.61 - 1.24 mg/dL Final  09/22/2020 02:42 AM 2.91 (H) 0.61 - 1.24 mg/dL Final  09/21/2020 06:04 AM 2.66 (H) 0.61 - 1.24 mg/dL Final  09/20/2020 04:06 AM 2.31 (H) 0.61 - 1.24 mg/dL Final  09/19/2020 04:48 PM 2.04 (H) 0.61 - 1.24 mg/dL Final  09/19/2020 05:54 AM 1.63 (H) 0.61 - 1.24 mg/dL Final  09/17/2020 02:10 AM 1.77 (H) 0.61 - 1.24 mg/dL Final  09/17/2020 05:00 AM 1.97 (H) 0.61 - 1.24 mg/dL Final  09/16/2020 05:01 AM 1.88 (H) 0.61 - 1.24 mg/dL Final  09/15/2020 03:21 AM 1.99 (H) 0.61 - 1.24 mg/dL Final  09/14/2020 09:40 AM 2.07 (H) 0.61 - 1.24 mg/dL Final  09/14/2020 12:31 AM 1.86 (H) 0.61 - 1.24 mg/dL Final  09/25/2020 04:34 PM 2.26 (H) 0.61 - 1.24 mg/dL Final  08/26/2020 05:58 PM 2.14 (H) 0.61 - 1.24 mg/dL Final  11/11/2013 04:40 AM 1.70 (H) 0.50 - 1.35 mg/dL Final  03/02/2012 05:15 AM 1.64 (H) 0.50 - 1.35 mg/dL Final  03/01/2012 05:12 AM 1.59 (H) 0.50 - 1.35 mg/dL Final  02/29/2012 09:33 PM 1.47 (H) 0.50 - 1.35 mg/dL Final  02/29/2012 11:20 AM 1.50 (H) 0.50 - 1.35 mg/dL Final     PMHx:   Past Medical History:  Diagnosis Date  . Bronchitis   . Environmental allergies   . Hypertension     Past Surgical History:  Procedure Laterality Date  . MUSCLE BIOPSY Left 09/17/2020   Procedure: QUADRICEPS MUSCLE BIOPSY;  Surgeon: Kieth Brightly Arta Bruce, MD;  Location: [facility]  OR;  Service: General;  Laterality: Left;  95 MINUTES ROOM 1    Family Hx:  Family History  Problem Relation Age of Onset  . CAD Neg Hx     Social History:  reports that he has quit smoking. He has never used smokeless tobacco. He reports previous alcohol use. He reports that he does not use drugs.  Allergies: No Known Allergies  Medications: Prior to Admission medications   Medication Sig Start Date End Date Taking? Authorizing Provider  albuterol (PROVENTIL  HFA;VENTOLIN HFA) 108 (90 BASE) MCG/ACT inhaler Inhale 2 puffs into the lungs every 4 (four) hours as needed for wheezing or shortness of breath. 03/02/12  Yes Hongalgi, Lenis Dickinson, MD  atorvastatin (LIPITOR) 20 MG tablet Take 20 mg by mouth daily.   Yes [provider]  cyclobenzaprine (FLEXERIL) 10 MG tablet Take 10 mg by mouth 3 (three) times daily. 09/04/20  Yes [provider]  fish oil-omega-3 fatty acids 1000 MG capsule Take 1 g by mouth daily.   Yes [provider]  metoCLOPramide (REGLAN) 10 MG tablet Take 10 mg by mouth 4 (four) times daily. 09/03/20  Yes [provider]  Multiple Vitamins-Minerals (MULTIVITAMIN WITH MINERALS) tablet Take 1 tablet by mouth daily.   Yes [provider]  naproxen (NAPROSYN) 500 MG tablet Take 500 mg by mouth 2 (two) times daily. 08/31/20  Yes [provider]  amLODipine (NORVASC) 10 MG tablet Take 1 tablet (10 mg total) by mouth daily. Patient not taking: Reported on 09/03/2020 03/02/12   Modena Jansky, MD  lisinopril (PRINIVIL,ZESTRIL) 20 MG tablet Take 1 tablet (20 mg total) by mouth daily. Patient not taking: Reported on 09/05/2020 03/02/12   Modena Jansky, MD  phenylephrine (SUDAFED PE) 10 MG TABS tablet Take 10 mg by mouth every 4 (four) hours as needed (for congestion).    [provider]  predniSONE (DELTASONE) 20 MG tablet Take 3 tablets (60 mg total) by mouth daily. Patient not taking: No sig reported 09/12/12   Truddie Hidden, MD    I have reviewed the patient's current medications.  Labs:  Results for orders placed or performed during the hospital encounter of 09/10/2020 (from the past 48 hour(s))  Glucose, capillary     Status: Abnormal   Collection Time: 09/21/20  3:41 PM  Result Value Ref Range   Glucose-Capillary 185 (H) 70 - 99 mg/dL    Comment: Glucose reference range applies only to samples taken after fasting for at least 8 hours.  Glucose, capillary     Status: Abnormal    Collection Time: 09/21/20  8:12 PM  Result Value Ref Range   Glucose-Capillary 241 (H) 70 - 99 mg/dL    Comment: Glucose reference range applies only to samples taken after fasting for at least 8 hours.  Glucose, capillary     Status: Abnormal   Collection Time: 09/22/20 12:23 AM  Result Value Ref Range   Glucose-Capillary 278 (H) 70 - 99 mg/dL    Comment: Glucose reference range applies only to samples taken after fasting for at least 8 hours.  CBC     Status: Abnormal   Collection Time: 09/22/20  2:42 AM  Result Value Ref Range   WBC 40.4 (H) 4.0 - 10.5 K/uL   RBC 2.70 (L) 4.22 - 5.81 MIL/uL   Hemoglobin 8.2 (L) 13.0 - 17.0 g/dL   HCT 25.6 (L) 39.0 - 52.0 %   MCV 94.8 80.0 - 100.0 fL   MCH  30.4 26.0 - 34.0 pg   MCHC 32.0 30.0 - 36.0 g/dL   RDW 18.8 (H) 11.5 - 15.5 %   Platelets 77 (L) 150 - 400 K/uL    Comment: Immature Platelet Fraction may be clinically indicated, consider ordering this additional test [id number] CONSISTENT WITH PREVIOUS RESULT    nRBC 1.8 (H) 0.0 - 0.2 %    Comment: Performed at [facility] [address], [place], [place] [address]  Heparin level (unfractionated)     Status: None   Collection Time: 09/22/20  2:42 AM  Result Value Ref Range   Heparin Unfractionated 0.54 0.30 - 0.70 IU/mL    Comment: (NOTE) The clinical reportable range upper limit is being lowered to >1.10 to align with the FDA approved guidance for the current laboratory assay.  If heparin results are below expected values, and patient dosage has  been confirmed, suggest follow up testing of antithrombin III levels. Performed at [facility], [place] [address] [place], [place] [address]   Basic metabolic panel     Status: Abnormal   Collection Time: 09/22/20  2:42 AM  Result Value Ref Range   Sodium 147 (H) 135 - 145 mmol/L   Potassium 4.4 3.5 - 5.1 mmol/L   Chloride 116 (H) 98 - 111 mmol/L   CO2 23 22 - 32 mmol/L   Glucose, Bld 289 (H) 70 - 99 mg/dL    Comment:  Glucose reference range applies only to samples taken after fasting for at least 8 hours.   BUN 117 (H) 6 - 20 mg/dL   Creatinine, Ser 2.91 (H) 0.61 - 1.24 mg/dL   Calcium 7.0 (L) 8.9 - 10.3 mg/dL   GFR, Estimated 25 (L) >60 mL/min    Comment: (NOTE) Calculated using the CKD-EPI Creatinine Equation (2021)    Anion gap 8 5 - 15    Comment: Performed at [facility] [address] [place], [place] [address]  Glucose, capillary     Status: Abnormal   Collection Time: 09/22/20  4:02 AM  Result Value Ref Range   Glucose-Capillary 263 (H) 70 - 99 mg/dL    Comment: Glucose reference range applies only to samples taken after fasting for at least 8 hours.  Glucose, capillary     Status: Abnormal   Collection Time: 09/22/20  7:44 AM  Result Value Ref Range   Glucose-Capillary 207 (H) 70 - 99 mg/dL    Comment: Glucose reference range applies only to samples taken after fasting for at least 8 hours.  Heparin level (unfractionated)     Status: None   Collection Time: 09/22/20  1:30 PM  Result Value Ref Range   Heparin Unfractionated 0.50 0.30 - 0.70 IU/mL    Comment: (NOTE) The clinical reportable range upper limit is being lowered to >1.10 to align with the FDA approved guidance for the current laboratory assay.  If heparin results are below expected values, and patient dosage has  been confirmed, suggest follow up testing of antithrombin III levels. Performed at [facility], [place] [address], [place], [place] [address]   Glucose, capillary     Status: Abnormal   Collection Time: 09/22/20  2:14 PM  Result Value Ref Range   Glucose-Capillary 233 (H) 70 - 99 mg/dL    Comment: Glucose reference range applies only to samples taken after fasting for at least 8 hours.  Glucose, capillary     Status: Abnormal   Collection Time: 09/22/20  4:07  PM  Result Value Ref Range   Glucose-Capillary 240 (H) 70 - 99 mg/dL    Comment: Glucose reference range applies only to samples taken  after fasting for at least 8 hours.  Hemoglobin and hematocrit, blood     Status: Abnormal   Collection Time: 09/22/20  4:18 PM  Result Value Ref Range   Hemoglobin 8.1 (L) 13.0 - 17.0 g/dL   HCT 25.0 (L) 39.0 - 52.0 %    Comment: Performed at [facility] [address], [place], [place] [address]  Glucose, capillary     Status: Abnormal   Collection Time: 09/22/20  8:24 PM  Result Value Ref Range   Glucose-Capillary 243 (H) 70 - 99 mg/dL    Comment: Glucose reference range applies only to samples taken after fasting for at least 8 hours.  Heparin level (unfractionated)     Status: None   Collection Time: 09/22/20  9:20 PM  Result Value Ref Range   Heparin Unfractionated 0.46 0.30 - 0.70 IU/mL    Comment: (NOTE) The clinical reportable range upper limit is being lowered to >1.10 to align with the FDA approved guidance for the current laboratory assay.  If heparin results are below expected values, and patient dosage has  been confirmed, suggest follow up testing of antithrombin III levels. Performed at [facility], [place] [address], [place], [place] [address]   Glucose, capillary     Status: Abnormal   Collection Time: 09/23/20 12:56 AM  Result Value Ref Range   Glucose-Capillary 227 (H) 70 - 99 mg/dL    Comment: Glucose reference range applies only to samples taken after fasting for at least 8 hours.  Glucose, capillary     Status: Abnormal   Collection Time: 09/23/20  3:57 AM  Result Value Ref Range   Glucose-Capillary 221 (H) 70 - 99 mg/dL    Comment: Glucose reference range applies only to samples taken after fasting for at least 8 hours.  CBC     Status: Abnormal   Collection Time: 09/23/20  4:56 AM  Result Value Ref Range   WBC 37.2 (H) 4.0 - 10.5 K/uL    Comment: REPEATED TO VERIFY   RBC 2.67 (L) 4.22 - 5.81 MIL/uL   Hemoglobin 8.1 (L) 13.0 - 17.0 g/dL   HCT 25.8 (L) 39.0 - 52.0 %   MCV 96.6 80.0 - 100.0 fL   MCH 30.3 26.0 - 34.0 pg   MCHC 31.4 30.0 -  36.0 g/dL   RDW 18.6 (H) 11.5 - 15.5 %   Platelets 59 (L) 150 - 400 K/uL    Comment: Immature Platelet Fraction may be clinically indicated, consider ordering this additional test [id number] CONSISTENT WITH PREVIOUS RESULT REPEATED TO VERIFY    nRBC 2.8 (H) 0.0 - 0.2 %    Comment: Performed at [facility], [place] [address], [place], [place] [address]  Comprehensive metabolic panel     Status: Abnormal   Collection Time: 09/23/20  4:56 AM  Result Value Ref Range   Sodium 144 135 - 145 mmol/L   Potassium 5.2 (H) 3.5 - 5.1 mmol/L   Chloride 112 (H) 98 - 111 mmol/L   CO2 21 (L) 22 - 32 mmol/L   Glucose, Bld 216 (H) 70 - 99 mg/dL    Comment: Glucose reference range applies only to samples taken after fasting for at least 8 hours.   BUN 143 (H) 6 - 20 mg/dL   Creatinine, Ser 3.56 (H)  0.61 - 1.24 mg/dL   Calcium 7.1 (L) 8.9 - 10.3 mg/dL   Total Protein 4.7 (L) 6.5 - 8.1 g/dL   Albumin 1.8 (L) 3.5 - 5.0 g/dL   AST 412 (H) 15 - 41 U/L   ALT 493 (H) 0 - 44 U/L   Alkaline Phosphatase 172 (H) 38 - 126 U/L   Total Bilirubin 1.8 (H) 0.3 - 1.2 mg/dL   GFR, Estimated 20 (L) >60 mL/min    Comment: (NOTE) Calculated using the CKD-EPI Creatinine Equation (2021)    Anion gap 11 5 - 15    Comment: Performed at [facility], [place] [address] [place], [place] [address]  .Cooxemetry Panel (carboxy, met, total hgb, O2 sat)     Status: Abnormal   Collection Time: 09/23/20  4:56 AM  Result Value Ref Range   Total hemoglobin 9.9 (L) 12.0 - 16.0 g/dL   O2 Saturation 44.8 %   Carboxyhemoglobin 1.0 0.5 - 1.5 %   Methemoglobin 1.0 0.0 - 1.5 %    Comment: Performed at [facility], [place] [address], [place], [place] [address]  Heparin level (unfractionated)     Status: None   Collection Time: 09/23/20  4:56 AM  Result Value Ref Range   Heparin Unfractionated 0.40 0.30 - 0.70 IU/mL    Comment: (NOTE) The clinical reportable range upper limit is being lowered to >1.10 to align with the FDA  approved guidance for the current laboratory assay.  If heparin results are below expected values, and patient dosage has  been confirmed, suggest follow up testing of antithrombin III levels. Performed at [facility], [place] [address], [place], [place] [address]   CK     Status: Abnormal   Collection Time: 09/23/20  7:55 AM  Result Value Ref Range   Total CK 32,320 (H) 49 - 397 U/L    Comment: RESULTS CONFIRMED BY MANUAL DILUTION Performed at [facility], [place] [address] [place], [place] [address]   Lactate dehydrogenase     Status: Abnormal   Collection Time: 09/23/20  7:55 AM  Result Value Ref Range   LDH 1,841 (H) 98 - 192 U/L    Comment: Performed at [facility], [place] [address] [place], [place] [address]  Glucose, capillary     Status: Abnormal   Collection Time: 09/23/20  8:10 AM  Result Value Ref Range   Glucose-Capillary 196 (H) 70 - 99 mg/dL    Comment: Glucose reference range applies only to samples taken after fasting for at least 8 hours.  .Cooxemetry Panel (carboxy, met, total hgb, O2 sat)     Status: Abnormal   Collection Time: 09/23/20 10:15 AM  Result Value Ref Range   Total hemoglobin 8.3 (L) 12.0 - 16.0 g/dL   O2 Saturation 44.4 %   Carboxyhemoglobin 1.3 0.5 - 1.5 %   Methemoglobin 1.2 0.0 - 1.5 %    Comment: Performed at [facility] [address] [place], [place] [address]  .Cooxemetry Panel (carboxy, met, total hgb, O2 sat)     Status: Abnormal   Collection Time: 09/23/20 12:55 PM  Result Value Ref Range   Total hemoglobin 8.3 (L) 12.0 - 16.0 g/dL   O2 Saturation 48.4 %   Carboxyhemoglobin 1.2 0.5 - 1.5 %   Methemoglobin 1.1 0.0 - 1.5 %    Comment: Performed at [facility] [address], [place],  [address]     ROS:  Unable to obtain review of system as patient is encephalopathic.  Physical Exam: Vitals:   09/23/20 1000 09/23/20 1100  BP: 114/84 132/80  Pulse: (!) 104 (!) 106  Resp: (!) 6 17  Temp:     SpO2: 100% 90%     General exam: Critically ill looking male lying on bed, only opens eyes with the name Respiratory system: Basal decreased breath sound, clear anteriorly. Cardiovascular system: S1 & S2 heard, RRR.  Bilateral lower extremities has pitting edema.. Gastrointestinal system: Abdomen is nondistended, soft. Normal bowel sounds heard. Central nervous system: Lethargic, only opens eyes with the name.  Not able to follow command Extremities: Edematous and weak Skin: No rashes, lesions or ulcers Psychiatry: not able to examine  Assessment/Plan:  #Acute kidney injury on CKD stage III multifactorial etiology including ischemic ATN due to cardiogenic/septic shock, severe CHF with poor renal perfusion, rhabdomyolysis.  He is on inotrope and pressors to maintain blood pressure.  Given declining urine output, low GFR, azotemia and very high CK level, he will benefit from RRT.  I have discussed this with the patient's mother and ICU team.  Plan to start CRRT after placement of HD catheter. 2K Pre and post filter and then 4K dialysate. UF as tolerated by BP.  He is already on IV heparin. I have explained to patient's mother that he may not able to tolerate regular IHD.  #Acute metabolic encephalopathy, also has uremia: Starting dialysis as above.  CT scan with stroke.  #Acute systolic CHF with cardiogenic shock, LV thrombus: Seen by cardiologist.  Currently on dobutamine, heparin drip.  #Pulmonary infiltrates, pneumonia: Completed antibiotics.  #Diffuse muscle weakness with elevated CPK, nontraumatic rhabdomyolysis status post muscle biopsy pending results.  Currently on a steroid.  #Multiorgan failure including congestive hepatopathy, renal failure, CHF, stroke.  Thank you for the consult, we will follow with you.  Alonza Knisley Tanna Furry 09/23/2020, 1:50 PM  [employer].

## 2020-09-23 NOTE — Progress Notes (Signed)
OT Cancellation Note  Patient Details Name: Dennis Howard. MRN: [id number] DOB: 11-09-1965   Cancelled Treatment:    Reason Eval/Treat Not Completed: Medical issues which prohibited therapy. Pt with lethargy, increased L thigh edema and requiring increase in Levophed per RN. Will continue to follow.   Evern Bio 09/23/2020, 2:05 PM  Martie Round, OTR/L Acute Rehabilitation Services Pager: [phone] Office: [phone]

## 2020-09-24 DIAGNOSIS — A419 Sepsis, unspecified organism: Secondary | ICD-10-CM | POA: Diagnosis not present

## 2020-09-24 DIAGNOSIS — M79604 Pain in right leg: Secondary | ICD-10-CM

## 2020-09-24 DIAGNOSIS — R918 Other nonspecific abnormal finding of lung field: Secondary | ICD-10-CM | POA: Diagnosis not present

## 2020-09-24 DIAGNOSIS — M79605 Pain in left leg: Secondary | ICD-10-CM

## 2020-09-24 DIAGNOSIS — R570 Cardiogenic shock: Secondary | ICD-10-CM | POA: Diagnosis not present

## 2020-09-24 DIAGNOSIS — D696 Thrombocytopenia, unspecified: Secondary | ICD-10-CM | POA: Diagnosis not present

## 2020-09-24 DIAGNOSIS — N189 Chronic kidney disease, unspecified: Secondary | ICD-10-CM | POA: Diagnosis not present

## 2020-09-24 DIAGNOSIS — D649 Anemia, unspecified: Secondary | ICD-10-CM | POA: Diagnosis not present

## 2020-09-24 LAB — PHOSPHORUS: Phosphorus: 4.5 mg/dL (ref 2.5–4.6)

## 2020-09-24 LAB — RENAL FUNCTION PANEL
Albumin: 2.1 g/dL — ABNORMAL LOW (ref 3.5–5.0)
Anion gap: 11 (ref 5–15)
BUN: 94 mg/dL — ABNORMAL HIGH (ref 6–20)
CO2: 23 mmol/L (ref 22–32)
Calcium: 6.9 mg/dL — ABNORMAL LOW (ref 8.9–10.3)
Chloride: 103 mmol/L (ref 98–111)
Creatinine, Ser: 2.65 mg/dL — ABNORMAL HIGH (ref 0.61–1.24)
GFR, Estimated: 28 mL/min — ABNORMAL LOW (ref >60)
Glucose, Bld: 159 mg/dL — ABNORMAL HIGH (ref 70–99)
Phosphorus: 4.9 mg/dL — ABNORMAL HIGH (ref 2.5–4.6)
Potassium: 5.7 mmol/L — ABNORMAL HIGH (ref 3.5–5.1)
Sodium: 137 mmol/L (ref 135–145)

## 2020-09-24 LAB — CBC
HCT: 27.6 % — ABNORMAL LOW (ref 39.0–52.0)
Hemoglobin: 8.7 g/dL — ABNORMAL LOW (ref 13.0–17.0)
MCH: 30.4 pg (ref 26.0–34.0)
MCHC: 31.5 g/dL (ref 30.0–36.0)
MCV: 96.5 fL (ref 80.0–100.0)
Platelets: 26 10*3/uL — CL (ref 150–400)
RBC: 2.86 MIL/uL — ABNORMAL LOW (ref 4.22–5.81)
RDW: 18.4 % — ABNORMAL HIGH (ref 11.5–15.5)
WBC: 36.2 10*3/uL — ABNORMAL HIGH (ref 4.0–10.5)
nRBC: 4.3 % — ABNORMAL HIGH (ref 0.0–0.2)

## 2020-09-24 LAB — COMPREHENSIVE METABOLIC PANEL
ALT: 630 U/L — ABNORMAL HIGH (ref 0–44)
AST: 496 U/L — ABNORMAL HIGH (ref 15–41)
Albumin: 2.1 g/dL — ABNORMAL LOW (ref 3.5–5.0)
Alkaline Phosphatase: 188 U/L — ABNORMAL HIGH (ref 38–126)
Anion gap: 13 (ref 5–15)
BUN: 107 mg/dL — ABNORMAL HIGH (ref 6–20)
CO2: 22 mmol/L (ref 22–32)
Calcium: 7.3 mg/dL — ABNORMAL LOW (ref 8.9–10.3)
Chloride: 103 mmol/L (ref 98–111)
Creatinine, Ser: 2.89 mg/dL — ABNORMAL HIGH (ref 0.61–1.24)
GFR, Estimated: 25 mL/min — ABNORMAL LOW (ref >60)
Glucose, Bld: 155 mg/dL — ABNORMAL HIGH (ref 70–99)
Potassium: 5.6 mmol/L — ABNORMAL HIGH (ref 3.5–5.1)
Sodium: 138 mmol/L (ref 135–145)
Total Bilirubin: 1.9 mg/dL — ABNORMAL HIGH (ref 0.3–1.2)
Total Protein: 5.4 g/dL — ABNORMAL LOW (ref 6.5–8.1)

## 2020-09-24 LAB — TROPONIN I (HIGH SENSITIVITY): Troponin I (High Sensitivity): 1090 ng/L (ref <18)

## 2020-09-24 LAB — GLUCOSE, CAPILLARY
Glucose-Capillary: 139 mg/dL — ABNORMAL HIGH (ref 70–99)
Glucose-Capillary: 146 mg/dL — ABNORMAL HIGH (ref 70–99)
Glucose-Capillary: 149 mg/dL — ABNORMAL HIGH (ref 70–99)
Glucose-Capillary: 156 mg/dL — ABNORMAL HIGH (ref 70–99)
Glucose-Capillary: 167 mg/dL — ABNORMAL HIGH (ref 70–99)
Glucose-Capillary: 193 mg/dL — ABNORMAL HIGH (ref 70–99)

## 2020-09-24 LAB — COPPER, URINE - RANDOM OR 24 HOUR
Copper / Creatinine Ratio: 1682 ug/g creat — ABNORMAL HIGH (ref 0–49)
Copper, Ur: 1026 ug/L
Creatinine(Crt),U: 0.61 g/L (ref 0.30–3.00)

## 2020-09-24 LAB — LACTATE DEHYDROGENASE: LDH: 2434 U/L — ABNORMAL HIGH (ref 98–192)

## 2020-09-24 LAB — MAGNESIUM: Magnesium: 2.7 mg/dL — ABNORMAL HIGH (ref 1.7–2.4)

## 2020-09-24 LAB — CK: Total CK: 42626 U/L — ABNORMAL HIGH (ref 49–397)

## 2020-09-24 LAB — COOXEMETRY PANEL
Carboxyhemoglobin: 1.1 % (ref 0.5–1.5)
Methemoglobin: 1.2 % (ref 0.0–1.5)
O2 Saturation: 33.5 %
Total hemoglobin: 8.5 g/dL — ABNORMAL LOW (ref 12.0–16.0)

## 2020-09-24 LAB — HEPARIN LEVEL (UNFRACTIONATED): Heparin Unfractionated: 0.26 IU/mL — ABNORMAL LOW (ref 0.30–0.70)

## 2020-09-24 LAB — SAVE SMEAR(SSMR), FOR PROVIDER SLIDE REVIEW

## 2020-09-24 MED ORDER — ORAL CARE MOUTH RINSE
15.0000 mL | Freq: Two times a day (BID) | OROMUCOSAL | Status: DC
  Administered 2020-09-24 – 2020-09-30 (×13): 15 mL via OROMUCOSAL

## 2020-09-24 MED ORDER — DIPHENHYDRAMINE HCL 12.5 MG/5ML PO ELIX
25.0000 mg | ORAL_SOLUTION | Freq: Four times a day (QID) | ORAL | Status: DC | PRN
  Administered 2020-09-24 – 2020-09-27 (×2): 25 mg
  Filled 2020-09-24 (×2): qty 10

## 2020-09-24 MED ORDER — ACETAMINOPHEN 325 MG PO TABS
650.0000 mg | ORAL_TABLET | ORAL | Status: DC | PRN
Start: 2020-09-24 — End: 2020-09-24
  Filled 2020-09-24: qty 2

## 2020-09-24 MED ORDER — PRISMASOL BGK 0/2.5 32-2.5 MEQ/L EC SOLN
Status: DC
  Filled 2020-09-24 (×69): qty 5000

## 2020-09-24 MED ORDER — SODIUM ZIRCONIUM CYCLOSILICATE 10 G PO PACK
10.0000 g | PACK | Freq: Once | ORAL | Status: AC
  Administered 2020-09-24: 10 g
  Filled 2020-09-24: qty 1

## 2020-09-24 MED ORDER — CALCIUM GLUCONATE-NACL 2-0.675 GM/100ML-% IV SOLN
INTRAVENOUS | Status: AC
  Administered 2020-09-24: 2000 mg via INTRAVENOUS
  Filled 2020-09-24: qty 100

## 2020-09-24 MED ORDER — CALCIUM GLUCONATE-NACL 2-0.675 GM/100ML-% IV SOLN
2.0000 g | Freq: Once | INTRAVENOUS | Status: AC
  Filled 2020-09-24: qty 100

## 2020-09-24 MED ORDER — ACD FORMULA A 0.73-2.45-2.2 GM/100ML VI SOLN
1000.0000 mL | Status: DC
  Filled 2020-09-24 (×2): qty 1000

## 2020-09-24 MED ORDER — SODIUM CHLORIDE 0.9 % IV SOLN
250.0000 mg | Freq: Four times a day (QID) | INTRAVENOUS | Status: DC
  Administered 2020-09-24 – 2020-09-30 (×25): 250 mg via INTRAVENOUS
  Filled 2020-09-24 (×27): qty 2

## 2020-09-24 MED ORDER — ADULT MULTIVITAMIN W/MINERALS CH
1.0000 | ORAL_TABLET | Freq: Every day | ORAL | Status: DC
  Administered 2020-09-24 – 2020-10-02 (×9): 1
  Filled 2020-09-24 (×8): qty 1

## 2020-09-24 MED ORDER — ANTICOAGULANT SODIUM CITRATE 4% (200MG/5ML) IV SOLN
5.0000 mL | Freq: Once | Status: DC
  Filled 2020-09-24: qty 5

## 2020-09-24 MED ORDER — OXYCODONE HCL 5 MG PO TABS
5.0000 mg | ORAL_TABLET | Freq: Four times a day (QID) | ORAL | Status: AC | PRN
  Administered 2020-09-24 – 2020-09-27 (×8): 5 mg via ORAL
  Filled 2020-09-24 (×8): qty 1

## 2020-09-24 MED ORDER — CALCIUM GLUCONATE 10 % IV SOLN
2.0000 g | Freq: Once | INTRAVENOUS | Status: DC
  Filled 2020-09-24: qty 20

## 2020-09-24 MED ORDER — DIPHENHYDRAMINE HCL 25 MG PO CAPS
25.0000 mg | ORAL_CAPSULE | Freq: Four times a day (QID) | ORAL | Status: DC | PRN
Start: 2020-09-24 — End: 2020-09-24
  Filled 2020-09-24: qty 1

## 2020-09-24 MED ORDER — ACD FORMULA A 0.73-2.45-2.2 GM/100ML VI SOLN
Status: AC
  Administered 2020-09-24: 1000 mL
  Filled 2020-09-24: qty 500

## 2020-09-24 MED ORDER — PIVOT 1.5 CAL PO LIQD
1000.0000 mL | ORAL | Status: DC
  Administered 2020-09-24 – 2020-10-02 (×10): 1000 mL
  Filled 2020-09-24 (×17): qty 1000

## 2020-09-24 MED ORDER — ACETAMINOPHEN 160 MG/5ML PO SOLN
650.0000 mg | ORAL | Status: DC | PRN
  Administered 2020-09-24: 650 mg
  Filled 2020-09-24 (×2): qty 20.3

## 2020-09-24 NOTE — Progress Notes (Addendum)
ANTICOAGULATION CONSULT NOTE - Follow Up Consult  Pharmacy Consult for IV Heparin Indication: LV mural thrombus  Heparin Dosing Weight:  77.4 kg  Labs: Recent Labs    09/22/20 0242 09/22/20 1330 09/22/20 1618 09/22/20 2120 09/23/20 0456 09/23/20 0755 09/23/20 1600 09/24/20 0510  HGB 8.2*  --  8.1*  --  8.1*  --   --  8.7*  HCT 25.6*  --  25.0*  --  25.8*  --   --  27.6*  PLT 77*  --   --   --  59*  --   --  26*  HEPARINUNFRC 0.54   < >  --  0.46 0.40  --   --  0.26*  CREATININE 2.91*  --   --   --  3.56*  --  4.07* 2.89*  CKTOTAL  --   --   --   --   --  32,320*  --  42,626*   < > = values in this interval not displayed.   Assessment: 55 yr old man presented with AMS. Pharmacy was consulted for heparin for NSTEMI; pt was on no anticoagulants PTA. H/H ok on admission, plt low at 121.  Held heparin on 4/18, patient now S/P muscle biopsy. Per Dr. Sheliah Hatch, resumed heparin at 1000 4/20.  MRI brain on 4/20 with multiple acute infarcts and subacute ischemic changes. Per Neurology, will target low end of goal range  On 4/21 in PM patient had large black/dark stool, FOBT+. Hgb 5.3 and transfused 4 units PRBC total. CCM held heparin since 4/22 . Patient with L thigh hematoma, per surgery CT scan indicative of post-op hematoma, no indication for drainage. Heparin to be  reintroduced cautiously.   Heparin level this morning slightly subtherapeutic at 0.26, on 850 units/hr, previously therapeutic. Hgb 8.7 this am, plt dropped from 59 to 26. No infusion issues per RN. Bowel movements dark tarry colored from rectal tube- stable but not worsening per RN this AM. LFTs worsening.   Per Dr. Gaynell Face regarding platelets of 26 - plan to continue heparin.  Goal of Therapy:  Heparin level 0.3-0.5 units/ml; no bolus Monitor platelets by anticoagulation protocol: Yes   Plan:  Increase heparin to 900 units/hr  Heparin level in 6 hours Monitor daily heparin level, CBC, platelets Monitor for  signs/symptoms of bleeding  Laverna Peace, PharmD PGY-1 Pharmacy Resident 09/24/2020 8:46 AM Please see AMION for all pharmacy numbers  ADDENDUM: Per Dr. Gaynell Face and Hematology, hold heparin and check a HIT panel.   Laverna Peace, PharmD PGY-1 Pharmacy Resident 09/24/2020 1:32 PM Please see AMION for all pharmacy numbers

## 2020-09-24 NOTE — Progress Notes (Signed)
eLink Physician-Brief Progress Note Patient Name: Dennis Howard. DOB: Oct 16, 1965 MRN: [id number]   Date of Service  09/24/2020  HPI/Events of Note  Patient has severe myalgia secondary to rhabdomyolysis. He is taking PO per bedside RN.  eICU Interventions  PRN Oxycodone ordered for pain        Odilia Damico U Tyger Wichman 09/24/2020, 5:07 AM

## 2020-09-24 NOTE — Progress Notes (Signed)
Advanced Heart Failure Rounding Note  PCP-Cardiologist: No primary care provider on file.    Patient Profile   55 y/o male w/ no prior cardiac history, admitted for mixed cardiogenic + septic shock in setting of PNA + hepatic encephalopathy.   ECHO LVEF 15% with severe global HK with regional variability + LV clot RV moderately down. Initial Co-ox 48%. Started on DBA.    Subjective:    Remains on DBA 6.5 mcg/kg/min. Now on NE 15 CO-OX 45% -> 33%.   Remains very lethargic and weak. Can barely open eyes  CVVHD started   CK up to 46K  Platelets 26k  Results of muscle biopsy still pending. Remains on steroids without appreciable change in mental status or strength.    Objective:   Weight Range: 78.5 kg Body mass index is 23.47 kg/m.   Vital Signs:   Temp:  [97.6 F (36.4 C)-98.2 F (36.8 C)] 97.7 F (36.5 C) (04/26 0758) Pulse Rate:  [102-113] 104 (04/26 0800) Resp:  [0-31] 31 (04/26 0800) BP: (98-142)/(80-106) 121/94 (04/26 0800) SpO2:  [90 %-100 %] 100 % (04/26 0800) Weight:  [78.5 kg] 78.5 kg (04/26 0430) Last BM Date: 09/22/20  Weight change: Filed Weights   09/22/20 0130 09/23/20 0446 09/24/20 0430  Weight: 80.3 kg 80.3 kg 78.5 kg    Intake/Output:   Intake/Output Summary (Last 24 hours) at 09/24/2020 0825 Last data filed at 09/24/2020 0800 Gross per 24 hour  Intake 4093.81 ml  Output 3803 ml  Net 290.81 ml      Physical Exam   General:  Very weak lethargic. Can barely open eyes  No resp difficulty HEENT: normal + cor-trak Neck: supple. JVP to jaw LIJ HD cath Carotids 2+ bilat; no bruits. No lymphadenopathy or thryomegaly appreciated. Cor: PMI nondisplaced. Regular rate & rhythm. No rubs, gallops or murmurs. Lungs: clear Abdomen: soft, nontender, nondistended. No hepatosplenomegaly. No bruits or masses. Good bowel sounds. Extremities: no cyanosis, clubbing, rash, 3+ edema  L thigh hematom Neuro: very weak unable to move  Telemetry   Sinus  Tach 100-110 Personally reviewed  Labs    CBC Recent Labs    09/23/20 0456 09/24/20 0510  WBC 37.2* 36.2*  HGB 8.1* 8.7*  HCT 25.8* 27.6*  MCV 96.6 96.5  PLT 59* 26*   Basic Metabolic Panel Recent Labs    09/98/33 1600 09/24/20 0510  NA 144 138  K 6.0* 5.6*  CL 108 103  CO2 21* 22  GLUCOSE 189* 155*  BUN 156* 107*  CREATININE 4.07* 2.89*  CALCIUM 7.0* 7.3*  MG  --  2.7*  PHOS 5.5* 4.5   Liver Function Tests Recent Labs    09/23/20 0456 09/23/20 1600 09/24/20 0510  AST 412*  --  496*  ALT 493*  --  630*  ALKPHOS 172*  --  188*  BILITOT 1.8*  --  1.9*  PROT 4.7*  --  5.4*  ALBUMIN 1.8* 1.9* 2.1*   No results for input(s): LIPASE, AMYLASE in the last 72 hours. Cardiac Enzymes Recent Labs    09/23/20 0755 09/24/20 0510  CKTOTAL 32,320* 42,626*    BNP: BNP (last 3 results) Recent Labs    2020-09-30 2114 09/14/20 0031 09/15/20 0321  BNP 1,447.9* 1,523.7* 743.8*    ProBNP (last 3 results) No results for input(s): PROBNP in the last 8760 hours.   D-Dimer No results for input(s): DDIMER in the last 72 hours. Hemoglobin A1C No results for input(s): HGBA1C in the last 72  hours. Fasting Lipid Panel No results for input(s): CHOL, HDL, LDLCALC, TRIG, CHOLHDL, LDLDIRECT in the last 72 hours. Thyroid Function Tests No results for input(s): TSH, T4TOTAL, T3FREE, THYROIDAB in the last 72 hours.  Invalid input(s): FREET3  Other results:   Imaging    US RENAL  Result Date: 09/23/2020 CLINICAL DATA:  Renal failure EXAM: RENAL / URINARY TRACT ULTRASOUND COMPLETE COMPARISON:  CT 09/16/2020 FINDINGS: Right Kidney: Renal measurements: 11.6 x 5.3 x 6.3 cm = volume: 201.2 mL. Echogenicity within normal limits. No mass or hydronephrosis visualized. Left Kidney: Renal measurements: 10.9 x 6.5 x 5.4 = volume: 200.8 mL. Echogenicity within normal limits. No mass or hydronephrosis visualized. Bladder: Minimal bladder distention with inadequate evaluation of the  bladder wall. Other: None. IMPRESSION: No hydronephrosis. Minimal bladder distension. Electronically Signed   By: Caprice Renshaw   On: 09/23/2020 13:07   DG CHEST PORT 1 VIEW  Result Date: 09/23/2020 CLINICAL DATA:  Central line placement. EXAM: PORTABLE CHEST 1 VIEW COMPARISON:  Radiograph 09/20/2020 FINDINGS: Right central line is stable in position with tip in the lower SVC. Left central line is new with tip projecting over the mid lower SVC. A stylet may be in place. No visualized pneumothorax. Weighted enteric tube in place with tip below the diaphragm. Mild cardiomegaly no pulmonary edema or focal airspace disease. No pleural effusion. IMPRESSION: 1. New left central line with tip projecting over the mid lower SVC. No pneumothorax. 2. Stable right central line with tip in the SVC. 3. Stable mild cardiomegaly. Electronically Signed   By: Narda Rutherford M.D.   On: 09/23/2020 16:35     Medications:     Scheduled Medications: . capsaicin   Topical BID  . Chlorhexidine Gluconate Cloth  6 each Topical Daily  . feeding supplement  1 Container Oral TID BM  . feeding supplement (PROSource TF)  45 mL Per Tube TID  . free water  200 mL Per Tube Q4H  . insulin aspart  0-20 Units Subcutaneous Q4H  . insulin aspart  4 Units Subcutaneous Q4H  . insulin detemir  15 Units Subcutaneous BID  . lidocaine   Topical BID  . magic mouthwash  10 mL Oral TID  . mouth rinse  15 mL Mouth Rinse BID  . methylPREDNISolone (SOLU-MEDROL) injection  40 mg Intravenous Q12H  . multivitamin with minerals  1 tablet Oral Daily  . pantoprazole  40 mg Intravenous Q12H  . sodium chloride flush  10-40 mL Intracatheter Q12H  . thiamine injection  100 mg Intravenous Daily    Infusions: . sodium chloride 10 mL/hr at 09/24/20 0800  . DOBUTamine 6.5 mcg/kg/min (09/24/20 0800)  . feeding supplement (OSMOLITE 1.5 CAL) 60 mL/hr at 09/23/20 1800  . heparin 850 Units/hr (09/24/20 0800)  . norepinephrine (LEVOPHED) Adult infusion  15 mcg/min (09/24/20 0800)  . prismasol BGK 2/2.5 dialysis solution    . prismasol BGK 2/2.5 replacement solution 500 mL/hr at 09/24/20 0634  . prismasol BGK 2/2.5 replacement solution 300 mL/hr at 09/23/20 1800    PRN Medications: sodium chloride, docusate, heparin, oxyCODONE, sodium chloride flush    Patient Profile   55 y/o male w/ no prior cardiac history, admitted for mixed cardiogenic + septic shock in setting of PNA + hepatic encephalopathy.   ECHO LVEF 15% with severe global HK with regional variability + LV clot RV moderately down. Initial Co-ox 48%. Started on DBA.    Assessment/Plan   1. Cardiogenic shock with multisystem organ failure - ECHO LVEF  15% with severe global HK with regional variability + LV clot RV moderately down. - Remains on DBA 6.5. NE 15 added CO-OX 45% -> 33%. Repeat CO-OX now.  -CVP 11-12. Worsening renal function.  - Unifying diagnosis remains unclear. Serologies unrevealing so far. Await result of muscle biopsy.  - He is actively dying. Suspect underlying myopathic process that has taken away his cardiac reserve as well - No response to inotropes or steroids. Would suggest salvage trial of plasmapheresis.  - Check troponin level. Continue inotropes for now. CVVHD for volume removal  - D/w CCM  2. LV thrombus - back on heparin. HGB stble - consider holding heparin with PLTs 23k   3. Shock liver with hepatic encephalopathy - Suspect due to low output HF. Transaminases improving with hemodynamics support - Liver u/s suggestive of hepatic steatosis but not long-term cirrhosis. Though low albumin and low PLTs suggestive of more longstanding liver process  - plan as above  4. AKI due to ATN/shock - Baseline creatinine unknown  - SCr 1.5 in 2016 in Care Everywhere - Started CVVHD 4/25. continue  5. CAP - PCT 3.70 -> 2.9 - BC NGTD  -Afebrile,  WBC 37 , on steroids - was on cefepime and ancef. Off antibiotics.    6. Rhabdomyolysis/myositis  with hematologic abnormalties - CK 6502>4025> 42K - muscle biopsy pending  - suspect underlying inflammatory process but serologies negative so far - No response to steroids. Consider PLEX as above  7. ABLA - likely due to left thigh hematoma +/- GI bleed - stable currently  8. Thrombocytopenia PLTs trending down 77>59 > 23k   CRITICAL CARE Performed by: Arvilla Meres  Total critical care time: 35 minutes  Critical care time was exclusive of separately billable procedures and treating other patients.  Critical care was necessary to treat or prevent imminent or life-threatening deterioration.  Critical care was time spent personally by me (independent of midlevel providers or residents) on the following activities: development of treatment plan with patient and/or surrogate as well as nursing, discussions with consultants, evaluation of patient's response to treatment, examination of patient, obtaining history from patient or surrogate, ordering and performing treatments and interventions, ordering and review of laboratory studies, ordering and review of radiographic studies, pulse oximetry and re-evaluation of patient's condition.    Length of Stay: 101  Arvilla Meres, MD  09/24/2020, 8:25 AM  Advanced Heart Failure Team Pager [phone] (M-F; 7a - 5p)  Please contact [facility] for night-coverage after hours (5p -7a ) and weekends on [email or web address]

## 2020-09-24 NOTE — Progress Notes (Addendum)
NAME:  Dennis Howard., MRN:  [id number], DOB:  Jan 08, 1966, LOS: 11 ADMISSION DATE:  09/12/2020, CONSULTATION DATE: 09/16/2020 REFERRING MD: Wilkie Aye - EM CHIEF COMPLAINT:  Altered Mental Status  History of Present Illness:  55 yo male former smoker presented with altered mental status that progressed for one week prior to admission.  This was associated with lower leg swelling with blisters, weight gain, dyspnea, and cough.  Also had progressive muscle weakness.  Had recent outpt treatment for pneumonia with doxycycline.  Pertinent  Medical History  CKD III - sr cr 2013 1.6, HTN, Bronchitis, THC Use    Significant Hospital Events: Including procedures, antibiotic start and stop dates in addition to other pertinent events   . 4/15 Admitted to ICU. Treated with azithro, ceftriaxone, flagyl, unasyn. Tylenol, acetaminophen, ETOH, influenza, COVID, hepatitis, urine strep antigen, legionella, & UDS negative.  . 4/16 ECHO with global hypokinesis, anteroseptal wall and apex are akinetic, anterior wall severely hypokinetic, very large layered semi-mobile apical thrombus extending from the apex along the anteroseptal wall, LVEF ~15-20%, RV systolic function mild to moderately reduced.  tarted on dobuatmine drip. ANA negative. Blood cultures negative.  . 4/17 Unasyn stopped, cefepime initiated  . 4/18 Dobutamine at , net neg 1.7L, tolerating soft diet, no n/v . 4/19 Dobutamine increased to 7.59mcg . 4/20 MRI Brain >> 41mm acute infarction at the inferior cerebellum on left and 25mm acute infarction at the left parietooccipital junction. Subacute ischemic changes of the cortical subcortical brain. . 4/22 Thigh hematoma post biopsy . 4/23 Dobutamine at 6.52mcg, pt on heparin gtt, PPI gtt. Muscle biopsy pending.  . 4/24 dark colored bowel movement, hemoglobin stable . 4/25 CK 32K, CRRT started  Interim History / Subjective:  On CRRT, no acute events overnight.   Objective   Blood pressure (!) 121/94,  pulse (!) 104, temperature 97.7 F (36.5 C), temperature source Axillary, resp. rate (!) 31, height 6' (1.829 m), weight 78.5 kg, SpO2 100 %. CVP:  [7 mmHg-11 mmHg] 7 mmHg      Intake/Output Summary (Last 24 hours) at 09/24/2020 0841 Last data filed at 09/24/2020 0800 Gross per 24 hour  Intake 4093.81 ml  Output 3803 ml  Net 290.81 ml   Filed Weights   09/22/20 0130 09/23/20 0446 09/24/20 0430  Weight: 80.3 kg 80.3 kg 78.5 kg    Examination:  General:   Acutely ill appearing middle aged male, normal body habitus Neuro:  Lethargic. Will arouse and briefly answer questions.   HEENT:  [place]/AT, PERRL, mild JVD Cardiovascular: RRR, no MRG. Bilateral lower extremity edema +3 pitting.  Lungs:  Clear bilateral breath sounds Abdomen: Soft, non-tender, non-distended Musculoskeletal: left thigh hematoma tight. Distal pulse intact.  Skin:  MMM, CDI L thigh biopsy site.   Resolved Hospital Problem list   Encephalopathy  Assessment & Plan:   Diffuse muscle weakness with elevated CPK and aldolase -  - Transition pred to solumedrol considering GIB/ poor PO - 4/21 muscle biopsy still pending, wont be back until next week per heme - May need to empirically offer PLEX, with few other options and deteriorating condition. Will discuss with hematology.  Acute systolic CHF with cardiogenic shock LV thrombus Hx of HTN, HLD  - continue dobutamine. Levophed started yesterday targeted towards co-ox which unfortunately continues to fall. 33 this morning - Heparin infusion despite thrombocytopenia - heart failure team following - hold outpt lipitor, norvasc, lisinopril  Pulmonary infiltrates: completed ABX 4/23 - f/u CXR intermittently - Steroids as above  Anemia, thrombocytopenia:  Hematology following. Not felt likely to be hemolytic process despite elevated LDH. ADAMTS13 38% so too high for TTP,. TAMOF? Meets criteria but no improvement with steroids.  Melanotic stool: Hgb slowly falling. 8.1  today.  - May need to offer PLEX empirically as above.  - Consider platelet transfusion. - Pulse steroids x 3 days through 4/29 - Stop heparin to assess for HIT   Steroid induced hyperglycemia - SSI resistant scale with tube feed coverage at 4 units q4h, levemir 15 units bid   AKI on CKD III: due to rhabdo + ischemic ATN secondary to cardiogenic shock.  Hypernatremia.  CKD 3B - CRRT ongoing, will hold for PLEX - Follow BMP - Appreciate nephrology assistance - Will consult vascular surgery for completeness to rule out compartment syndrome in left lower extremity  Dysphagia.  Moderate protein calorie malnutrition - continue tube feeds  Acute embolic CVA - Neurology has signed off - Will need MRA brain and carotid ultrasound once more stable.  - Call neurology back when muscle biopsy returns   Elevated LFTs. Congestive Hepatopathy -follow LFT trend, continuing to trend up  Thrush - improved - magic mouth wash prn  Best practice (right click and "Reselect all SmartList Selections" daily)  Diet:  Tube Feed , PO diet as tolerated Pain/Anxiety/Delirium protocol (if indicated): No VAP protocol (if indicated): Not indicated DVT prophylaxis: Systemic AC GI prophylaxis: PPI Glucose control:  SSI No Central venous access:  Yes, and it is still needed Arterial line:  N/A Foley:  N/A,  Mobility:  bed rest  PT consulted: N/A Last date of multidisciplinary goals of care discussion - ongoing Code Status:  full code Disposition: ICU  Labs:   CMP Latest Ref Rng & Units 09/24/2020 09/23/2020 09/23/2020  Glucose 70 - 99 mg/dL 102(V) 253(G) 644(I)  BUN 6 - 20 mg/dL 347(Q) 259(D) 638(V)  Creatinine 0.61 - 1.24 mg/dL 5.64(P) 3.29(J) 1.88(C)  Sodium 135 - 145 mmol/L 138 144 144  Potassium 3.5 - 5.1 mmol/L 5.6(H) 6.0(H) 5.2(H)  Chloride 98 - 111 mmol/L 103 108 112(H)  CO2 22 - 32 mmol/L 22 21(L) 21(L)  Calcium 8.9 - 10.3 mg/dL 7.3(L) 7.0(L) 7.1(L)  Total Protein 6.5 - 8.1 g/dL 1.6(S)  - 4.7(L)  Total Bilirubin 0.3 - 1.2 mg/dL 0.6(T) - 1.8(H)  Alkaline Phos 38 - 126 U/L 188(H) - 172(H)  AST 15 - 41 U/L 496(H) - 412(H)  ALT 0 - 44 U/L 630(H) - 493(H)    CBC Latest Ref Rng & Units 09/24/2020 09/23/2020 09/22/2020  WBC 4.0 - 10.5 K/uL 36.2(H) 37.2(H) -  Hemoglobin 13.0 - 17.0 g/dL 0.1(S) 8.1(L) 8.1(L)  Hematocrit 39.0 - 52.0 % 27.6(L) 25.8(L) 25.0(L)  Platelets 150 - 400 K/uL 26(LL) 59(L) -    ABG    Component Value Date/Time   PHART 7.540 (H) 09/19/2020 1650   PCO2ART 27.2 (L) 09/19/2020 1650   PO2ART 113 (H) 09/19/2020 1650   HCO3 23.3 09/19/2020 1650   TCO2 24 09/19/2020 1650   ACIDBASEDEF 1.8 09/14/2020 0031   O2SAT 33.5 09/24/2020 0517    CBG (last 3)  Recent Labs    09/23/20 2340 09/24/20 0330 09/24/20 0806  GLUCAP 127* 149* 156*    Critical care time: 40 minutes    Joneen Roach, AGACNP-BC [facility]  See Amion for personal pager PCCM on call pager [phone] until 7pm Please call Elink 7p-7a. [phone]  09/24/2020 8:41 AM

## 2020-09-24 NOTE — Progress Notes (Signed)
[facility] NEPHROLOGY PROGRESS NOTE  Assessment/ Plan: Pt is a 55 y.o. yo male  with history of hypertension, CKD was admitted on 4/15 with altered mental status, found to have systolic CHF, respiratory failure required mechanical ventilation, rhabdomyolysis, now seen as a consultation for the evaluation of acute kidney injury.  #Acute kidney injury on CKD stage III multifactorial etiology including ischemic ATN due to cardiogenic/septic shock, severe CHF with poor renal perfusion, rhabdomyolysis.  He is on inotrope and pressors to maintain blood pressure.  Given declining urine output, low GFR, azotemia and very high CK level, CRRT started on 4/25.  I have discussed this with the patient's mother and ICU team.  CRRT tolerating well with UF around 100 cc/hour.  Change all bath to 2K, monitor lab.   UF as tolerated by BP.  He is already on IV heparin. I have explained to patient's mother that he may not able to tolerate regular IHD.  #Acute metabolic encephalopathy, also has uremia:  CT scan with stroke.  #Acute systolic CHF with cardiogenic shock, LV thrombus: Seen by cardiologist.  Currently on dobutamine, heparin drip.  Not much improvement.  #Pulmonary infiltrates, pneumonia: Completed antibiotics.  #Diffuse muscle weakness with elevated CPK, nontraumatic rhabdomyolysis status post muscle biopsy pending results.  Currently on a steroid.  Hematology is following.  #Multiorgan failure including congestive hepatopathy, renal failure, CHF, stroke.  Subjective: Seen and examined.  Started CRRT yesterday after placement of left IJ temporary HD catheter.  Urine output is around 729 cc.  BP holding on pressors.  No other events. Objective Vital signs in last 24 hours: Vitals:   09/24/20 0630 09/24/20 0700 09/24/20 0758 09/24/20 0800  BP: (!) 132/92 118/90  (!) 121/94  Pulse: (!) 105 (!) 104  (!) 104  Resp: 20 14  (!) 31  Temp:   97.7 F (36.5 C)   TempSrc:   Axillary    SpO2: 100% 100%  100%  Weight:      Height:       Weight change: -1.8 kg  Intake/Output Summary (Last 24 hours) at 09/24/2020 0852 Last data filed at 09/24/2020 0800 Gross per 24 hour  Intake 4093.81 ml  Output 3803 ml  Net 290.81 ml       Labs: Basic Metabolic Panel: Recent Labs  Lab 09/20/20 0406 09/21/20 0604 09/23/20 0456 09/23/20 1600 09/24/20 0510  NA 150*   < > 144 144 138  K 4.2   < > 5.2* 6.0* 5.6*  CL 116*   < > 112* 108 103  CO2 28   < > 21* 21* 22  GLUCOSE 258*   < > 216* 189* 155*  BUN 90*   < > 143* 156* 107*  CREATININE 2.31*   < > 3.56* 4.07* 2.89*  CALCIUM 7.2*   < > 7.1* 7.0* 7.3*  PHOS 3.1  --   --  5.5* 4.5   < > = values in this interval not displayed.   Liver Function Tests: Recent Labs  Lab 09/21/20 0604 09/23/20 0456 09/23/20 1600 09/24/20 0510  AST 285* 412*  --  496*  ALT 305* 493*  --  630*  ALKPHOS 183* 172*  --  188*  BILITOT 1.7* 1.8*  --  1.9*  PROT 4.3* 4.7*  --  5.4*  ALBUMIN 1.6* 1.8* 1.9* 2.1*   No results for input(s): LIPASE, AMYLASE in the last 168 hours. Recent Labs  Lab 09/17/20 0951  AMMONIA 17   CBC: Recent Labs  Lab 09/20/20 0406 09/20/20 1844 09/21/20 0604 09/22/20 0242 09/22/20 1618 09/23/20 0456 09/24/20 0510  WBC 40.9*  --  43.5* 40.4*  --  37.2* 36.2*  NEUTROABS 37.8*  --   --   --   --   --   --   HGB 7.0*   < > 8.7* 8.2* 8.1* 8.1* 8.7*  HCT 21.7*   < > 26.1* 25.6* 25.0* 25.8* 27.6*  MCV 95.2  --  91.6 94.8  --  96.6 96.5  PLT 77*  --  79* 77*  --  59* 26*   < > = values in this interval not displayed.   Cardiac Enzymes: Recent Labs  Lab 09/23/20 0755 09/24/20 0510  CKTOTAL 32,320* 42,626*   CBG: Recent Labs  Lab 09/23/20 1719 09/23/20 1937 09/23/20 2340 09/24/20 0330 09/24/20 0806  GLUCAP 178* 149* 127* 149* 156*    Iron Studies: No results for input(s): IRON, TIBC, TRANSFERRIN, FERRITIN in the last 72 hours. Studies/Results: US RENAL  Result Date: 09/23/2020 CLINICAL  DATA:  Renal failure EXAM: RENAL / URINARY TRACT ULTRASOUND COMPLETE COMPARISON:  CT 09/16/2020 FINDINGS: Right Kidney: Renal measurements: 11.6 x 5.3 x 6.3 cm = volume: 201.2 mL. Echogenicity within normal limits. No mass or hydronephrosis visualized. Left Kidney: Renal measurements: 10.9 x 6.5 x 5.4 = volume: 200.8 mL. Echogenicity within normal limits. No mass or hydronephrosis visualized. Bladder: Minimal bladder distention with inadequate evaluation of the bladder wall. Other: None. IMPRESSION: No hydronephrosis. Minimal bladder distension. Electronically Signed   By: Caprice Renshaw   On: 09/23/2020 13:07   DG CHEST PORT 1 VIEW  Result Date: 09/23/2020 CLINICAL DATA:  Central line placement. EXAM: PORTABLE CHEST 1 VIEW COMPARISON:  Radiograph 09/20/2020 FINDINGS: Right central line is stable in position with tip in the lower SVC. Left central line is new with tip projecting over the mid lower SVC. A stylet may be in place. No visualized pneumothorax. Weighted enteric tube in place with tip below the diaphragm. Mild cardiomegaly no pulmonary edema or focal airspace disease. No pleural effusion. IMPRESSION: 1. New left central line with tip projecting over the mid lower SVC. No pneumothorax. 2. Stable right central line with tip in the SVC. 3. Stable mild cardiomegaly. Electronically Signed   By: Narda Rutherford M.D.   On: 09/23/2020 16:35    Medications: Infusions: . sodium chloride 10 mL/hr at 09/24/20 0800  . DOBUTamine 6.5 mcg/kg/min (09/24/20 0800)  . feeding supplement (OSMOLITE 1.5 CAL) 60 mL/hr at 09/23/20 1800  . heparin 850 Units/hr (09/24/20 0800)  . norepinephrine (LEVOPHED) Adult infusion 15 mcg/min (09/24/20 0800)  . prismasol BGK 2/2.5 dialysis solution 1,500 mL/hr at 09/24/20 0827  . prismasol BGK 2/2.5 replacement solution 500 mL/hr at 09/24/20 0634  . prismasol BGK 2/2.5 replacement solution 300 mL/hr at 09/23/20 1800    Scheduled Medications: . capsaicin   Topical BID  .  Chlorhexidine Gluconate Cloth  6 each Topical Daily  . feeding supplement  1 Container Oral TID BM  . feeding supplement (PROSource TF)  45 mL Per Tube TID  . free water  200 mL Per Tube Q4H  . insulin aspart  0-20 Units Subcutaneous Q4H  . insulin aspart  4 Units Subcutaneous Q4H  . insulin detemir  15 Units Subcutaneous BID  . lidocaine   Topical BID  . magic mouthwash  10 mL Oral TID  . mouth rinse  15 mL Mouth Rinse BID  . methylPREDNISolone (SOLU-MEDROL) injection  40 mg Intravenous Q12H  .  multivitamin with minerals  1 tablet Oral Daily  . pantoprazole  40 mg Intravenous Q12H  . sodium chloride flush  10-40 mL Intracatheter Q12H  . thiamine injection  100 mg Intravenous Daily    have reviewed scheduled and prn medications.  Physical Exam: General: Critically ill looking male, only opens eyes with the name. Heart:RRR, s1s2 nl Lungs: Clear anteriorly, no increased work of breathing Abdomen:soft, Non-tender, non-distended Extremities: LE edema present Dialysis Access: L IJ temporary HD catheter in place  Venna Berberich Prasad Jenasia Dolinar 09/24/2020,8:52 AM  LOS: 11 days

## 2020-09-24 NOTE — Consult Note (Addendum)
[facility] NOTE   MRN : [id number]  Reason for Consult: Rule out compartment syndrome. Referring Physician: Dr. Joneen Roach  History of Present Illness: 55 y/o male admitted with CHF and multiple medical issues to include AKI on CRRT, failure to thrive on NG tube feeds with fluid over load.  Recent pneumonia, anemia with thrombocytopenia.  He is on heparin due to CVA and LV thrombus.    We have been consulted for left LE possible compartment syndrome.  The RN states he has been complaining of left lower leg pain.  He has a dressing on the left upper thigh after muscle biopsy.    The patient is not responsive to verbal ques.        Current Facility-Administered Medications  Medication Dose Route Frequency Provider Last Rate Last Admin  . 0.9 %  sodium chloride infusion   Intravenous PRN Briant Sites, DO 10 mL/hr at 09/24/20 1100 Infusion Verify at 09/24/20 1100  . capsaicin (ZOSTRIX) 0.025 % cream   Topical BID Gordy Councilman, MD   Given at 09/24/20 1037  . Chlorhexidine Gluconate Cloth 2 % PADS 6 each  6 each Topical Daily Martina Sinner, MD   6 each at 09/23/20 2230  . DOBUTamine (DOBUTREX) infusion 4000 mcg/mL  6.5 mcg/kg/min Intravenous Titrated Clegg, Amy D, NP 7.17 mL/hr at 09/24/20 1100 6.5 mcg/kg/min at 09/24/20 1100  . docusate (COLACE) 50 MG/5ML liquid 100 mg  100 mg Per Tube BID PRN Kinsinger, De Blanch, MD      . feeding supplement (PIVOT 1.5 CAL) liquid 1,000 mL  1,000 mL Per Tube Continuous Briant Sites, DO      . heparin ADULT infusion 100 units/mL (25000 units/272mL)  900 Units/hr Intravenous Continuous Briant Sites, DO 9 mL/hr at 09/24/20 1100 900 Units/hr at 09/24/20 1100  . heparin injection 1,000-6,000 Units  1,000-6,000 Units CRRT PRN Maxie Barb, MD      . insulin aspart (novoLOG) injection 0-20 Units  0-20 Units Subcutaneous Q4H Martina Sinner, MD   4 Units at 09/24/20 [phone]  . insulin aspart  (novoLOG) injection 4 Units  4 Units Subcutaneous Q4H Martina Sinner, MD   4 Units at 09/24/20 [phone]  . insulin detemir (LEVEMIR) injection 15 Units  15 Units Subcutaneous BID Duayne Cal, NP   15 Units at 09/24/20 0936  . lidocaine (XYLOCAINE) 5 % ointment   Topical BID Gordy Councilman, MD   Given at 09/24/20 0945  . magic mouthwash  10 mL Oral TID Kinsinger, De Blanch, MD   10 mL at 09/24/20 0938  . MEDLINE mouth rinse  15 mL Mouth Rinse BID Briant Sites, DO   15 mL at 09/24/20 2423  . methylPREDNISolone sodium succinate (SOLU-MEDROL) 40 mg/mL injection 40 mg  40 mg Intravenous Q12H Duayne Cal, NP   40 mg at 09/24/20 0524  . multivitamin with minerals tablet 1 tablet  1 tablet Per Tube Daily Briant Sites, DO   1 tablet at 09/24/20 0941  . norepinephrine (LEVOPHED) 16 mg in premix infusion  15 mcg/min Intravenous Continuous Bensimhon, Bevelyn Buckles, MD 14.06 mL/hr at 09/24/20 1100 15 mcg/min at 09/24/20 1100  . oxyCODONE (Oxy IR/ROXICODONE) immediate release tablet 5 mg  5 mg Oral Q6H PRN Migdalia Dk, MD   5 mg at 09/24/20 0524  . pantoprazole (PROTONIX) injection 40 mg  40 mg Intravenous Q12H Karl Ito, MD   40 mg at 09/24/20 0934  .  prismasol BGK 2/2.5 dialysis solution   CRRT Continuous Maxie Barb, MD 1,500 mL/hr at 09/24/20 0827 New Bag at 09/24/20 0827  . prismasol BGK 2/2.5 replacement solution   CRRT Continuous Maxie Barb, MD 500 mL/hr at 09/24/20 0634 New Bag at 09/24/20 0175  . prismasol BGK 2/2.5 replacement solution   CRRT Continuous Maxie Barb, MD 300 mL/hr at 09/24/20 0911 New Bag at 09/24/20 0911  . sodium chloride flush (NS) 0.9 % injection 10-40 mL  10-40 mL Intracatheter Q12H Kinsinger, De Blanch, MD   10 mL at 09/24/20 0937  . sodium chloride flush (NS) 0.9 % injection 10-40 mL  10-40 mL Intracatheter PRN Kinsinger, De Blanch, MD   50 mL at 09/23/20 2250  . thiamine (B-1) injection 100 mg  100 mg Intravenous  Daily Bhagat, Srishti L, MD   100 mg at 09/24/20 0934    Pt meds include: Statin :No Betablocker: No ASA: No Other anticoagulants/antiplatelets: Heparin   Past Medical History:  Diagnosis Date  . Bronchitis   . Environmental allergies   . Hypertension     Past Surgical History:  Procedure Laterality Date  . MUSCLE BIOPSY Left 09/27/2020   Procedure: QUADRICEPS MUSCLE BIOPSY;  Surgeon: Sheliah Hatch, De Blanch, MD;  Location: [facility] OR;  Service: General;  Laterality: Left;  60 MINUTES ROOM 1    Social History Social History   Tobacco Use  . Smoking status: Former Games developer  . Smokeless tobacco: Never Used  Vaping Use  . Vaping Use: Never used  Substance Use Topics  . Alcohol use: Not Currently  . Drug use: No    Family History Family History  Problem Relation Age of Onset  . CAD Neg Hx     No Known Allergies   REVIEW OF SYSTEMS  General: [ ]  Weight loss, [ ]  Fever, [ ]  chills Neurologic: [ ]  Dizziness, [ ]  Blackouts, [ ]  Seizure [ ]  Stroke, [ ]  "Mini stroke", [ ]  Slurred speech, [ ]  Temporary blindness; [ ]  weakness in arms or legs, [ ]  Hoarseness [ ]  Dysphagia Cardiac: [ ]  Chest pain/pressure, [ ]  Shortness of breath at rest [ ]  Shortness of breath with exertion, [ ]  Atrial fibrillation or irregular heartbeat  Vascular: [ ]  Pain in legs with walking, [ ]  Pain in legs at rest, [ ]  Pain in legs at night,  [ ]  Non-healing ulcer, [ ]  Blood clot in vein/DVT,   Pulmonary: [ ]  Home oxygen, [ ]  Productive cough, [ ]  Coughing up blood, [ ]  Asthma,  [ ]  Wheezing [ ]  COPD Musculoskeletal:  [ ]  Arthritis, [ ]  Low back pain, [ ]  Joint pain Hematologic: [ ]  Easy Bruising, [ ]  Anemia; [ ]  Hepatitis Gastrointestinal: [ ]  Blood in stool, [ ]  Gastroesophageal Reflux/heartburn, Urinary: [ ]  chronic Kidney disease, [ ]  on HD - [ ]  MWF or [ ]  TTHS, [ ]  Burning with urination, [ ]  Difficulty urinating Skin: [ ]  Rashes, [ ]  Wounds Psychological: [ ]  Anxiety, [ ]  Depression  Physical  Examination Vitals:   09/24/20 0900 09/24/20 0945 09/24/20 1030 09/24/20 1100  BP: (!) 139/112 (!) 124/95 (!) 122/91 123/86  Pulse: (!) 103 (!) 103 (!) 104 (!) 104  Resp: 16 (!) 26 (!) 21 14  Temp:      TempSrc:      SpO2: 100% 99% 100% 100%  Weight:      Height:       Body mass index is 23.47 kg/m.  General:  Ill appearing HENT: WNL Eyes: Pupils equal Pulmonary: normal non-labored breathing , without Rales, rhonchi,  wheezing Cardiac: RRR, without  Murmurs, rubs or gallops; No carotid bruits Abdomen: soft, NT, no masses Skin: no rashes, ulcers noted;  no Gangrene , no cellulitis; no open wounds;   Vascular Exam/Pulses: Doppler signals PT right > left.  Compartment soft with out sign of ischemia   Musculoskeletal: no muscle wasting or atrophy; positive B LE edema.  Left thigh dressing is not restrictive Neurologic: non responsive to verbal ques      Significant Diagnostic Studies: CBC Lab Results  Component Value Date   WBC 36.2 (H) 09/24/2020   HGB 8.7 (L) 09/24/2020   HCT 27.6 (L) 09/24/2020   MCV 96.5 09/24/2020   PLT 26 (LL) 09/24/2020    BMET    Component Value Date/Time   NA 138 09/24/2020 0510   K 5.6 (H) 09/24/2020 0510   CL 103 09/24/2020 0510   CO2 22 09/24/2020 0510   GLUCOSE 155 (H) 09/24/2020 0510   BUN 107 (H) 09/24/2020 0510   CREATININE 2.89 (H) 09/24/2020 0510   CALCIUM 7.3 (L) 09/24/2020 0510   GFRNONAA 25 (L) 09/24/2020 0510   GFRAA 54 (L) 11/11/2013 0440   Estimated Creatinine Clearance: 32.4 mL/min (A) (by C-G formula based on SCr of 2.89 mg/dL (H)).  COAG Lab Results  Component Value Date   INR 1.4 (H) 09/17/2020   INR 1.7 (H) 08/30/2020       ASSESSMENT/PLAN:   Patient is ill appearing with multiple system problems. Pneumonia, thrombocytopenia with plantlet count 26 AKI on CRRT, CHF with failure to thrive   He has doppler PT signals right > left and he is on pressures with significant edema.  Compartments are soft on  exam.   With his PLT issues we do not feel there is a need for surgical intervention at this time.    Mosetta Pigeon 09/24/2020 11:10 AM   I have interviewed the patient and examined the patient. I agree with the findings by the PA.  Currently the calves are soft and I do not think there is any evidence of compartment syndrome.  He really does not have a history to suggest a reason for compartment syndrome.  He does have evidence of peripheral vascular disease but does have a reasonable posterior tibial signal on the left with a Doppler.  We will follow peripherally.  09/24/2020 5:04 PM  Waverly Ferrari, MD

## 2020-09-24 NOTE — Progress Notes (Signed)
Physical Therapy Treatment Patient Details Name: Dennis Howard. MRN: [id number] DOB: [birth year] Today's Date: 09/24/2020    History of Present Illness 55 y/o man who presented to [facility] on 4/15 with  AMS, cardiogenic + septic shock in setting of PNA + hepatic encephalopathy. Also with LV thrombus and NSTEMI.    MRI showing 33mm acute infarction at the inferior cerebellum on left and 56mm acute infarction at the left parietooccipital junction. Subacute ischemic changes of the cortical subcortical brain.  He has associated lower extremity swelling, weight gain, shortness of breath, and cough. Pending workup for neuromuscular disorder. S/P L quadriceps biopsy 4/20 resulted in left thigh hematoma. 4/25 back on pressors and CRRT initiated. PMH: chronic kidney disease, hypertension, Marijuana daily for 20 + years    PT Comments    Patient now on CRRT and back on pressors since last seen by PT. Bed level exercises completed with pt weaker than previous session (required AAROM where he previously could perform AROM). Due to change in medical status, updated discharge plan to [facility].     Follow Up Recommendations  LTACH     Equipment Recommendations  Rolling walker with 5" wheels;3in1 (PT)    Recommendations for Other Services       Precautions / Restrictions Precautions Precautions: Fall Precaution Comments: CRRT; left thigh hematoma    Mobility  Bed Mobility               General bed mobility comments: deferred due to LE pain and lack of +2 assist    Transfers                    Ambulation/Gait                 Stairs             Wheelchair Mobility    Modified Rankin (Stroke Patients Only) Modified Rankin (Stroke Patients Only) Pre-Morbid Rankin Score: No symptoms Modified Rankin: Severe disability     Balance                                            Cognition Arousal/Alertness: Lethargic;Suspect due to medications (had  pain meds prior) Behavior During Therapy: Flat affect Overall Cognitive Status: Impaired/Different from baseline Area of Impairment: Memory;Following commands;Safety/judgement;Awareness;Problem solving;Attention                 Orientation Level:  (NT) Current Attention Level: Sustained Memory: Decreased recall of precautions;Decreased short-term memory Following Commands: Follows one step commands inconsistently;Follows one step commands with increased time Safety/Judgement: Decreased awareness of safety;Decreased awareness of deficits Awareness: Intellectual Problem Solving: Slow processing;Decreased initiation;Difficulty sequencing;Requires verbal cues;Requires tactile cues General Comments: no verbalizations but answers yes/no ?'s with head nods/shakes; slow/delayed motor responses in legs (rt worse than left)      Exercises General Exercises - Upper Extremity Shoulder Flexion: 5 reps;Supine;Right;AAROM Shoulder ABduction: AAROM;Right;5 reps Shoulder ADduction: AROM;Right;5 reps (from abdct position) Elbow Flexion: Both;Supine;AAROM;5 reps Elbow Extension: Both;Supine;AAROM;5 reps Digit Composite Flexion: AROM;Both;5 reps Composite Extension: AROM;Both;5 reps General Exercises - Lower Extremity Ankle Circles/Pumps: AROM;Both;Supine;5 reps (denies pain with AROM, but with limited ROM) Heel Slides: AAROM;Both;5 reps Hip ABduction/ADduction: AAROM;Both;5 reps    General Comments        Pertinent Vitals/Pain Pain Assessment: Faces Faces Pain Scale: Hurts little more Pain Location: rt shoulder ER caused grimace; feet very painful  if touched therefore only did AROM of ankles/toes Pain Descriptors / Indicators: Grimacing;Guarding Pain Intervention(s): Limited activity within patient's tolerance;Monitored during session;Repositioned    Home Living                      Prior Function            PT Goals (current goals can now be found in the care plan  section) Acute Rehab PT Goals Patient Stated Goal: none stated Time For Goal Achievement: 10/01/20 Potential to Achieve Goals: Good Progress towards PT goals: Not progressing toward goals - comment (medical decline, now on CRRT and pressors)    Frequency    Min 3X/week      PT Plan Discharge plan needs to be updated    Co-evaluation              AM-PAC PT "6 Clicks" Mobility   Outcome Measure  Help needed turning from your back to your side while in a flat bed without using bedrails?: Total Help needed moving from lying on your back to sitting on the side of a flat bed without using bedrails?: Total Help needed moving to and from a bed to a chair (including a wheelchair)?: Total Help needed standing up from a chair using your arms (e.g., wheelchair or bedside chair)?: Total Help needed to walk in hospital room?: Total Help needed climbing 3-5 steps with a railing? : Total 6 Click Score: 6    End of Session Equipment Utilized During Treatment: Oxygen Activity Tolerance: Patient limited by fatigue Patient left: in bed;with call bell/phone within reach;with bed alarm set;with nursing/sitter in room (bed in chair position)   PT Visit Diagnosis: Unsteadiness on feet (R26.81);Muscle weakness (generalized) (M62.81)     Time: 7741-2878 PT Time Calculation (min) (ACUTE ONLY): 20 min  Charges:  $Therapeutic Exercise: 8-22 mins                      Jerolyn Center, PT Pager [phone]    Zena Amos 09/24/2020, 4:27 PM

## 2020-09-24 NOTE — Progress Notes (Addendum)
IP PROGRESS NOTE  Subjective:   [name] opens eyes briefly and interacts minimally. No bleeding reported. He has no other complaints. Mother at bedside.   Objective: Vital signs in last 24 hours: Blood pressure (!) 122/91, pulse (!) 104, temperature 97.7 F (36.5 C), temperature source Axillary, resp. rate (!) 21, height 6' (1.829 m), weight 78.5 kg, SpO2 100 %.  Intake/Output from previous day: 04/25 0701 - 04/26 0700 In: 4129.7 [P.O.:240; I.V.:899.7; NG/GT:2990] Out: 3985 [Urine:729; Stool:800]  Physical Exam:  HEENT: no bleeding, nasal feeding tube in place Abdomen: Soft, no hepatosplenomegaly, nontender Extremities: Trace edema at the lower leg bilaterally, few "blister "lesions at the lower legs, light purplish discoloration at the great toe bilaterally Musculoskeletal: There is swelling at the left mid and upper thigh Neurologic: Alert, follows simple commands, moves all extremities to command, oriented to place and year  Right IJ catheter site without evidence of infection or bleeding  Lab Results: Recent Labs    09/23/20 0456 09/24/20 0510  WBC 37.2* 36.2*  HGB 8.1* 8.7*  HCT 25.8* 27.6*  PLT 59* 26*  [id number] on 09/17/2020 - 38.2% Blood smear 09/24/2020: The platelets are decreased in number, no platelet clumps.  There is a leukocytosis.  The majority the white cells are mature neutrophils.  No blast or other young forms are seen.  Increased nucleated red cells.  The polychromasia is increased.  There are burr cells, target cells, acanthocytes, few ovalocytes, and a few schistocytes. BMET Recent Labs    09/23/20 1600 09/24/20 0510  NA 144 138  K 6.0* 5.6*  CL 108 103  CO2 21* 22  GLUCOSE 189* 155*  BUN 156* 107*  CREATININE 4.07* 2.89*  CALCIUM 7.0* 7.3*   Bilirubin 1.6, LDH 594 No results found for: CEA1  Studies/Results: US RENAL  Result Date: 09/23/2020 CLINICAL DATA:  Renal failure EXAM: RENAL / URINARY TRACT ULTRASOUND COMPLETE COMPARISON:  CT  09/16/2020 FINDINGS: Right Kidney: Renal measurements: 11.6 x 5.3 x 6.3 cm = volume: 201.2 mL. Echogenicity within normal limits. No mass or hydronephrosis visualized. Left Kidney: Renal measurements: 10.9 x 6.5 x 5.4 = volume: 200.8 mL. Echogenicity within normal limits. No mass or hydronephrosis visualized. Bladder: Minimal bladder distention with inadequate evaluation of the bladder wall. Other: None. IMPRESSION: No hydronephrosis. Minimal bladder distension. Electronically Signed   By: Caprice Renshaw   On: 09/23/2020 13:07   DG CHEST PORT 1 VIEW  Result Date: 09/23/2020 CLINICAL DATA:  Central line placement. EXAM: PORTABLE CHEST 1 VIEW COMPARISON:  Radiograph 09/20/2020 FINDINGS: Right central line is stable in position with tip in the lower SVC. Left central line is new with tip projecting over the mid lower SVC. A stylet may be in place. No visualized pneumothorax. Weighted enteric tube in place with tip below the diaphragm. Mild cardiomegaly no pulmonary edema or focal airspace disease. No pleural effusion. IMPRESSION: 1. New left central line with tip projecting over the mid lower SVC. No pneumothorax. 2. Stable right central line with tip in the SVC. 3. Stable mild cardiomegaly. Electronically Signed   By: Narda Rutherford M.D.   On: 09/23/2020 16:35    Medications: I have reviewed the patient's current medications.  Assessment/Plan: 1.  Pulmonary infiltrates 2.  Chronic/acute renal failure 3.  Cardiogenic shock on hospital admission 4.  Liver failure secondary to cardiogenic shock, underlying chronic liver disease? 5.  Anemia 6.  Thrombocytopenia 7.   Coagulopathy 8.   Altered mental status-hepatic encephalopathy? 9.   Rhabdomyolysis 10.  Oral candidiasis 11.  Muscle weakness-status post a muscle biopsy 02-Oct-2020 12.  Multiple acute CVAs on MRI 02-Oct-2020-likely embolic related to the LV thrombus  Dennis Howard appears unchanged from prior exam.  He responds to questions with nods.  I  continue to have a low suspicion for hemolysis.  The bilirubin is stable and he is not mounting a reticulocytosis.  His LDH elevated which is nonspecific, however he also has significantly elevated CK which is most consistent with rhabdomyolysis.  Platelet count down to 26000 today. He has no active bleeding. The hematologic findings are likely related to a systemic inflammatory condition with bone marrow suppression.  On CRRT. Nephrology following. BUN and creatinine down today. Troponin >1000 this am.   Muscle biopsy results are not yet available.  Recommendations:  1.  Transfuse packed red blood cells as needed 2.  Daily CBC 3.  Follow-up muscle biopsy pathology (the tissue was sent out for consultation and a result will not be back until next week.) 4.  Steroids and anticoagulation as recommended by the critical care and neurology services 5.  CRRT per Nephrology.  6.  Recommend a trial of daily plasmapheresis. HD unit notified of pans for daily plasmapheresis to begin today.  Discussed plans for plasmapheresis with the patient's mother today.  She agrees to proceed.  Dennis Pare, DNP, Dennis Howard, Dennis Howard   LOS: 11 days  Dennis Howard was examined.  I reviewed the laboratory studies and today's blood smear.  I discussed the case with the critical care service.  The CPK and LDH are higher.  The platelet count is lower.  The muscle biopsy pathology is pending.  I went to pathology and requested a preliminary result from the outside consultant.  We were told the tissue is still in processing and has not been reviewed by a pathologist.  Dennis Howard is critically ill with multiorgan failure.  We do not have a unifying diagnosis.  He does not have the typical presentation of TTP, but I think it is reasonable begin a trial of plasma exchange.  I discussed his current status with his mother.  We reviewed the risk and potential benefit of plasmapheresis.  His mother is in agreement with proceeding.   Dennis Howard is lethargic at present.  The plan is to begin daily plasmapheresis.  Dr. Gaynell Face will resume high-dose steroids.  I I was present for greater than 50% of today's visit.  I perform medical decision making.

## 2020-09-24 NOTE — Progress Notes (Signed)
Nutrition Follow-up  DOCUMENTATION CODES:   Severe malnutrition in context of acute illness/injury  INTERVENTION:   Continue tube feeds via Cortrak: - Change to Pivot 1.5 @ 65 ml/hr (1560 ml/day) - Free water flushes per CCM, currently 200 ml q 4 hours  Tube feeding regimen provides 2340 kcal, 146 grams of protein, and 1170 ml of H2O.  Total free water with flushes: 2370 ml  - Continue MVI with minerals daily per tube  - d/c Boost Breeze  NUTRITION DIAGNOSIS:   Severe Malnutrition related to decreased appetite,acute illness (sore tongue) as evidenced by moderate muscle depletion,moderate fat depletion.  Ongoing, being addressed via TF  GOAL:   Patient will meet greater than or equal to 90% of their needs  Met via TF  MONITOR:   PO intake,Supplement acceptance,TF tolerance  REASON FOR ASSESSMENT:   Consult Assessment of nutrition requirement/status  ASSESSMENT:   Pt with PMH of CKD stage III, and HTN admitted 4/15 with mixed cardiogenic and septic shock and multisystem organ failure in setting of PNA  4/18 - NG tube removed, started on clear liquids 4/20 - s/p quadriceps muscle biopsy in OR, Cortrak placed (tip gastric) 4/25 - CRRT initiated  Pt now with multiorgan failure without a unifying diagnosis. Per notes, pt is very lethargic and weak and can barely open his eyes. HF Team suspects underlying myopathic process that has taken away his cardiac reserve. Pt has not responded to inotropes or steroids. HF Team suggesting salvage trial of plasmapheresis.  Pt currently on GI soft diet. No meal completions charted since 4/23. On 4/23, pt ate 0% of breakfast and lunch meals.  Pt continues to have deep pitting edema to BLE and non-pitting edema to BUE.  Current TF: Osmolite 1.5 @ 60 ml/hr, ProSource TF 45 ml TID, free water flushes 200 ml q 4 horus  Admit weight: 82.7 kg Current weight: 78.5 kg Lowest weight: 73.5 kg  Medications reviewed and include: Boost  Breeze TID, SSI q 4 hours, novolog 4 units q 4 hours, levemir 15 units BID, magic mouthwash, IV solu-medrol, MVI with minerals, thiamine, dobutamine, heparin, levophed  Labs reviewed: potassium 5.6, BUN 107, creatinine 2.89, magnesium 2.7, elevated LFTs, hemoglobin 8.7, platelets 26 CBG's: 127-183 x 24 hours  UOP: 729 ml x 24 hours CRRT UF: 2456 ml x 24 hours Stool: 800 ml x 24 hours I/O's: -3.7 L since admit  Diet Order:   Diet Order            DIET SOFT Room service appropriate? Yes; Fluid consistency: Thin  Diet effective now                 EDUCATION NEEDS:   No education needs have been identified at this time  Skin:  Skin Assessment: Skin Integrity Issues: Incisions: L thigh s/p biopsy Other: popped blister R ankle, blister to L pretibial  Last BM:  09/24/20 rectal tube  Height:   Ht Readings from Last 1 Encounters:  09/14/20 6' (1.829 m)    Weight:   Wt Readings from Last 1 Encounters:  09/24/20 78.5 kg    Ideal Body Weight:  80.9 kg  BMI:  Body mass index is 23.47 kg/m.  Estimated Nutritional Needs:   Kcal:  2300-2500  Protein:  140-160 grams  Fluid:  2 L/day    Gustavus Bryant, MS, RD, LDN Inpatient Clinical Dietitian Please see AMiON for contact information.

## 2020-09-25 DIAGNOSIS — D649 Anemia, unspecified: Secondary | ICD-10-CM | POA: Diagnosis not present

## 2020-09-25 DIAGNOSIS — R600 Localized edema: Secondary | ICD-10-CM

## 2020-09-25 DIAGNOSIS — N189 Chronic kidney disease, unspecified: Secondary | ICD-10-CM | POA: Diagnosis not present

## 2020-09-25 DIAGNOSIS — N170 Acute kidney failure with tubular necrosis: Secondary | ICD-10-CM | POA: Diagnosis not present

## 2020-09-25 DIAGNOSIS — N1832 Chronic kidney disease, stage 3b: Secondary | ICD-10-CM | POA: Diagnosis not present

## 2020-09-25 DIAGNOSIS — R918 Other nonspecific abnormal finding of lung field: Secondary | ICD-10-CM | POA: Diagnosis not present

## 2020-09-25 DIAGNOSIS — R570 Cardiogenic shock: Secondary | ICD-10-CM | POA: Diagnosis not present

## 2020-09-25 DIAGNOSIS — A419 Sepsis, unspecified organism: Secondary | ICD-10-CM | POA: Diagnosis not present

## 2020-09-25 DIAGNOSIS — D696 Thrombocytopenia, unspecified: Secondary | ICD-10-CM | POA: Diagnosis not present

## 2020-09-25 LAB — RENAL FUNCTION PANEL
Albumin: 2.8 g/dL — ABNORMAL LOW (ref 3.5–5.0)
Albumin: 2.8 g/dL — ABNORMAL LOW (ref 3.5–5.0)
Anion gap: 9 (ref 5–15)
Anion gap: 12 (ref 5–15)
BUN: 80 mg/dL — ABNORMAL HIGH (ref 6–20)
BUN: 85 mg/dL — ABNORMAL HIGH (ref 6–20)
CO2: 25 mmol/L (ref 22–32)
CO2: 28 mmol/L (ref 22–32)
Calcium: 7.3 mg/dL — ABNORMAL LOW (ref 8.9–10.3)
Calcium: 7.4 mg/dL — ABNORMAL LOW (ref 8.9–10.3)
Chloride: 103 mmol/L (ref 98–111)
Chloride: 97 mmol/L — ABNORMAL LOW (ref 98–111)
Creatinine, Ser: 2.24 mg/dL — ABNORMAL HIGH (ref 0.61–1.24)
Creatinine, Ser: 2.52 mg/dL — ABNORMAL HIGH (ref 0.61–1.24)
GFR, Estimated: 34 mL/min — ABNORMAL LOW (ref >60)
GFR, Estimated: 30 mL/min — ABNORMAL LOW (ref >60)
Glucose, Bld: 147 mg/dL — ABNORMAL HIGH (ref 70–99)
Glucose, Bld: 157 mg/dL — ABNORMAL HIGH (ref 70–99)
Phosphorus: 4.3 mg/dL (ref 2.5–4.6)
Phosphorus: 6 mg/dL — ABNORMAL HIGH (ref 2.5–4.6)
Potassium: 4.8 mmol/L (ref 3.5–5.1)
Potassium: 5.1 mmol/L (ref 3.5–5.1)
Sodium: 137 mmol/L (ref 135–145)
Sodium: 137 mmol/L (ref 135–145)

## 2020-09-25 LAB — THERAPEUTIC PLASMA EXCHANGE (BLOOD BANK)
Plasma Exchange: 3790
Plasma volume needed: 3790
Unit division: 0
Unit division: 0
Unit division: 0
Unit division: 0
Unit division: 0
Unit division: 0
Unit division: 0
Unit division: 0

## 2020-09-25 LAB — HEPARIN INDUCED PLATELET AB (HIT ANTIBODY): Heparin Induced Plt Ab: 2.82 OD — ABNORMAL HIGH (ref 0.000–0.400)

## 2020-09-25 LAB — GLUCOSE, CAPILLARY
Glucose-Capillary: 111 mg/dL — ABNORMAL HIGH (ref 70–99)
Glucose-Capillary: 114 mg/dL — ABNORMAL HIGH (ref 70–99)
Glucose-Capillary: 131 mg/dL — ABNORMAL HIGH (ref 70–99)
Glucose-Capillary: 138 mg/dL — ABNORMAL HIGH (ref 70–99)
Glucose-Capillary: 147 mg/dL — ABNORMAL HIGH (ref 70–99)
Glucose-Capillary: 160 mg/dL — ABNORMAL HIGH (ref 70–99)

## 2020-09-25 LAB — COMPREHENSIVE METABOLIC PANEL
ALT: 288 U/L — ABNORMAL HIGH (ref 0–44)
AST: 282 U/L — ABNORMAL HIGH (ref 15–41)
Albumin: 2.8 g/dL — ABNORMAL LOW (ref 3.5–5.0)
Alkaline Phosphatase: 100 U/L (ref 38–126)
Anion gap: 10 (ref 5–15)
BUN: 89 mg/dL — ABNORMAL HIGH (ref 6–20)
CO2: 26 mmol/L (ref 22–32)
Calcium: 7.4 mg/dL — ABNORMAL LOW (ref 8.9–10.3)
Chloride: 101 mmol/L (ref 98–111)
Creatinine, Ser: 2.43 mg/dL — ABNORMAL HIGH (ref 0.61–1.24)
GFR, Estimated: 31 mL/min — ABNORMAL LOW (ref >60)
Glucose, Bld: 152 mg/dL — ABNORMAL HIGH (ref 70–99)
Potassium: 5.4 mmol/L — ABNORMAL HIGH (ref 3.5–5.1)
Sodium: 137 mmol/L (ref 135–145)
Total Bilirubin: 1.7 mg/dL — ABNORMAL HIGH (ref 0.3–1.2)
Total Protein: 5.3 g/dL — ABNORMAL LOW (ref 6.5–8.1)

## 2020-09-25 LAB — CBC
HCT: 26.2 % — ABNORMAL LOW (ref 39.0–52.0)
Hemoglobin: 8.1 g/dL — ABNORMAL LOW (ref 13.0–17.0)
MCH: 29.8 pg (ref 26.0–34.0)
MCHC: 30.9 g/dL (ref 30.0–36.0)
MCV: 96.3 fL (ref 80.0–100.0)
Platelets: 21 10*3/uL — CL (ref 150–400)
RBC: 2.72 MIL/uL — ABNORMAL LOW (ref 4.22–5.81)
RDW: 17.8 % — ABNORMAL HIGH (ref 11.5–15.5)
WBC: 31.6 10*3/uL — ABNORMAL HIGH (ref 4.0–10.5)
nRBC: 3.4 % — ABNORMAL HIGH (ref 0.0–0.2)

## 2020-09-25 LAB — COOXEMETRY PANEL
Carboxyhemoglobin: 1.1 % (ref 0.5–1.5)
Methemoglobin: 1.1 % (ref 0.0–1.5)
O2 Saturation: 34.4 %
Total hemoglobin: 8.4 g/dL — ABNORMAL LOW (ref 12.0–16.0)

## 2020-09-25 LAB — VOLATILES,BLD-ACETONE,ETHANOL,ISOPROP,METHANOL
Acetone, blood: <0.01 g/dL (ref 0.000–0.010)
Ethanol, blood: <0.01 g/dL (ref 0.000–0.010)
Isopropanol, blood: <0.01 g/dL (ref 0.000–0.010)
Methanol, blood: <0.01 g/dL (ref 0.000–0.010)

## 2020-09-25 LAB — TYPE AND SCREEN
ABO/RH(D): A POS
Antibody Screen: NEGATIVE

## 2020-09-25 LAB — VITAMIN B6: Vitamin B6: 7.7 ug/L (ref 3.4–65.2)

## 2020-09-25 LAB — EHRLICHIA ANTIBODY PANEL
E chaffeensis (HGE) Ab, IgG: NEGATIVE
E chaffeensis (HGE) Ab, IgM: NEGATIVE
E. Chaffeensis (HME) IgM Titer: NEGATIVE
E.Chaffeensis (HME) IgG: NEGATIVE

## 2020-09-25 LAB — PHOSPHORUS: Phosphorus: 4.4 mg/dL (ref 2.5–4.6)

## 2020-09-25 LAB — HEAVY METALS, BLOOD
Arsenic: 1 ug/L (ref 0–9)
Lead: <1 ug/dL (ref 0–4)
Mercury: <1 ug/L (ref 0.0–14.9)

## 2020-09-25 LAB — MAGNESIUM: Magnesium: 2.7 mg/dL — ABNORMAL HIGH (ref 1.7–2.4)

## 2020-09-25 LAB — LACTATE DEHYDROGENASE: LDH: 1484 U/L — ABNORMAL HIGH (ref 98–192)

## 2020-09-25 LAB — CK: Total CK: 33136 U/L — ABNORMAL HIGH (ref 49–397)

## 2020-09-25 LAB — PATHOLOGIST SMEAR REVIEW

## 2020-09-25 LAB — COPPER, SERUM: Copper: 132 ug/dL (ref 69–132)

## 2020-09-25 MED ORDER — ACETAMINOPHEN 325 MG PO TABS: 650.0000 mg | ORAL_TABLET | ORAL | Status: DC | PRN

## 2020-09-25 MED ORDER — ANTICOAGULANT SODIUM CITRATE 4% (200MG/5ML) IV SOLN: 5.0000 mL | Freq: Once | Status: DC

## 2020-09-25 MED ORDER — CHLORHEXIDINE GLUCONATE 0.12 % MT SOLN: 15.0000 mL | Freq: Two times a day (BID) | OROMUCOSAL | Status: DC

## 2020-09-25 MED ORDER — CALCIUM CARBONATE ANTACID 500 MG PO CHEW
2.0000 | CHEWABLE_TABLET | ORAL | Status: AC
  Administered 2020-09-25 (×2): 400 mg
  Filled 2020-09-25 (×2): qty 2

## 2020-09-25 MED ORDER — ACD FORMULA A 0.73-2.45-2.2 GM/100ML VI SOLN
Status: AC
  Filled 2020-09-25: qty 500

## 2020-09-25 MED ORDER — ACD FORMULA A 0.73-2.45-2.2 GM/100ML VI SOLN: 1000.0000 mL | Status: DC

## 2020-09-25 MED ORDER — DIPHENHYDRAMINE HCL 25 MG PO CAPS: 25.0000 mg | ORAL_CAPSULE | Freq: Four times a day (QID) | ORAL | Status: DC | PRN

## 2020-09-25 MED ORDER — CALCIUM GLUCONATE-NACL 2-0.675 GM/100ML-% IV SOLN
2.0000 g | Freq: Once | INTRAVENOUS | Status: DC
  Filled 2020-09-25: qty 100

## 2020-09-25 NOTE — Progress Notes (Addendum)
PCCM INTERVAL PROGRESS NOTE  GOC discussion with patient's father in person and mother via phone together. They understand Dennis Howard is critically ill and is receiving near the maximal level of support we can offer and we have exhausted all treatment options without benefit. They understand that resuscitative efforts in the event of cardiac arrest offer him no possibility of a meaningful recovery. With this in mind, they elect to enact a partial code, allowing for intubation in the setting of respiratory failure, but no cardiopulmonary resuscitation in the setting of cardiac arrest. They are aware there is a high possibility he would suffer cardiac arrest during the intubation process.   Outcome of discussion.  - Limited code. No CPR/shocks, ok for intubation.      Joneen Roach, AGACNP-BC [facility]  See Amion for personal pager PCCM on call pager [phone] until 7pm. Please call Elink 7p-7a. [phone]  09/25/2020 1:20 PM

## 2020-09-25 NOTE — Progress Notes (Signed)
[facility] NEPHROLOGY PROGRESS NOTE  Assessment/ Plan: Pt is a 55 y.o. yo male  with history of hypertension, CKD was admitted on 4/15 with altered mental status, found to have systolic CHF, respiratory failure required mechanical ventilation, rhabdomyolysis, now seen as a consultation for the evaluation of acute kidney injury.  #Acute kidney injury on CKD stage III multifactorial etiology including ischemic ATN due to cardiogenic/septic shock, severe CHF with poor renal perfusion, rhabdomyolysis.  He is on inotrope and pressors to maintain blood pressure.  Given declining urine output, low GFR, azotemia and very high CK level, CRRT started on 4/25.  I have discussed this with the patient's mother and ICU team.  CRRT tolerating well with UF around 100 cc/hour.  Currently on all 2K bath, we will repeat renal panel this morning.  UF as tolerated by BP.  He is already on IV heparin. I have explained to patient's mother that he may not able to tolerate regular IHD.  #Acute metabolic encephalopathy, also has uremia:  CT scan with stroke.  #Acute systolic CHF with cardiogenic shock, LV thrombus: Seen by cardiologist.  Currently on dobutamine, heparin drip.  Not much improvement.  #Pulmonary infiltrates, pneumonia: Completed antibiotics.  #Diffuse muscle weakness with elevated CPK, nontraumatic rhabdomyolysis status post muscle biopsy pending results.  Currently on IV Solu-Medrol and plasma exchange by hematologist.  #Multiorgan failure including congestive hepatopathy, renal failure, CHF, stroke.  Subjective: Seen and examined.  Remains anuric and tolerating CRRT well.  On pressors.  He had plasma exchange overnight when CRRT had to be held because of the access. Objective Vital signs in last 24 hours: Vitals:   09/25/20 0600 09/25/20 0630 09/25/20 0734 09/25/20 0800  BP: 139/86   (!) 132/94  Pulse: (!) 114 (!) 112  (!) 109  Resp: 14 12  11   Temp:   98.6 F (37 C)    TempSrc:   Axillary   SpO2: 100% 100%  100%  Weight:      Height:       Weight change: -0.2 kg  Intake/Output Summary (Last 24 hours) at 09/25/2020 0833 Last data filed at 09/25/2020 0800 Gross per 24 hour  Intake 3075.81 ml  Output 4305 ml  Net -1229.19 ml       Labs: Basic Metabolic Panel: Recent Labs  Lab 09/24/20 0510 09/24/20 1601 09/25/20 0315  NA 138 137 137  K 5.6* 5.7* 5.4*  CL 103 103 101  CO2 22 23 26   GLUCOSE 155* 159* 152*  BUN 107* 94* 89*  CREATININE 2.89* 2.65* 2.43*  CALCIUM 7.3* 6.9* 7.4*  PHOS 4.5 4.9* 4.4   Liver Function Tests: Recent Labs  Lab 09/23/20 0456 09/23/20 1600 09/24/20 0510 09/24/20 1601 09/25/20 0315  AST 412*  --  496*  --  282*  ALT 493*  --  630*  --  288*  ALKPHOS 172*  --  188*  --  100  BILITOT 1.8*  --  1.9*  --  1.7*  PROT 4.7*  --  5.4*  --  5.3*  ALBUMIN 1.8*   < > 2.1* 2.1* 2.8*   < > = values in this interval not displayed.   No results for input(s): LIPASE, AMYLASE in the last 168 hours. No results for input(s): AMMONIA in the last 168 hours. CBC: Recent Labs  Lab 09/20/20 0406 09/20/20 1844 09/21/20 0604 09/22/20 0242 09/22/20 1618 09/23/20 0456 09/24/20 0510 09/25/20 0315  WBC 40.9*  --  43.5* 40.4*  --  37.2*  36.2* 31.6*  NEUTROABS 37.8*  --   --   --   --   --   --   --   HGB 7.0*   < > 8.7* 8.2*   < > 8.1* 8.7* 8.1*  HCT 21.7*   < > 26.1* 25.6*   < > 25.8* 27.6* 26.2*  MCV 95.2  --  91.6 94.8  --  96.6 96.5 96.3  PLT 77*  --  79* 77*  --  59* 26* 21*   < > = values in this interval not displayed.   Cardiac Enzymes: Recent Labs  Lab 09/23/20 0755 09/24/20 0510  CKTOTAL 32,320* 42,626*   CBG: Recent Labs  Lab 09/24/20 1536 09/24/20 1945 09/24/20 2333 09/25/20 0319 09/25/20 0729  GLUCAP 139* 193* 167* 147* 131*    Iron Studies: No results for input(s): IRON, TIBC, TRANSFERRIN, FERRITIN in the last 72 hours. Studies/Results: US RENAL  Result Date: 09/23/2020 CLINICAL DATA:   Renal failure EXAM: RENAL / URINARY TRACT ULTRASOUND COMPLETE COMPARISON:  CT 09/16/2020 FINDINGS: Right Kidney: Renal measurements: 11.6 x 5.3 x 6.3 cm = volume: 201.2 mL. Echogenicity within normal limits. No mass or hydronephrosis visualized. Left Kidney: Renal measurements: 10.9 x 6.5 x 5.4 = volume: 200.8 mL. Echogenicity within normal limits. No mass or hydronephrosis visualized. Bladder: Minimal bladder distention with inadequate evaluation of the bladder wall. Other: None. IMPRESSION: No hydronephrosis. Minimal bladder distension. Electronically Signed   By: Caprice Renshaw   On: 09/23/2020 13:07   DG CHEST PORT 1 VIEW  Result Date: 09/23/2020 CLINICAL DATA:  Central line placement. EXAM: PORTABLE CHEST 1 VIEW COMPARISON:  Radiograph 09/20/2020 FINDINGS: Right central line is stable in position with tip in the lower SVC. Left central line is new with tip projecting over the mid lower SVC. A stylet may be in place. No visualized pneumothorax. Weighted enteric tube in place with tip below the diaphragm. Mild cardiomegaly no pulmonary edema or focal airspace disease. No pleural effusion. IMPRESSION: 1. New left central line with tip projecting over the mid lower SVC. No pneumothorax. 2. Stable right central line with tip in the SVC. 3. Stable mild cardiomegaly. Electronically Signed   By: Narda Rutherford M.D.   On: 09/23/2020 16:35    Medications: Infusions: . sodium chloride 10 mL/hr at 09/25/20 0800  . anticoagulant sodium citrate    . citrate dextrose Stopped (09/24/20 1900)  . DOBUTamine 6.5 mcg/kg/min (09/25/20 0800)  . feeding supplement (PIVOT 1.5 CAL) 1,000 mL (09/25/20 0625)  . methylPREDNISolone (SOLU-MEDROL) injection Stopped (09/25/20 0707)  . norepinephrine (LEVOPHED) Adult infusion 15 mcg/min (09/25/20 0800)  . prismasol BGK 2/2.5 dialysis solution 1,500 mL/hr at 09/25/20 0657  . prismasol BGK 2/2.5 replacement solution 500 mL/hr at 09/24/20 2220  . prismasol BGK 2/2.5 replacement  solution 300 mL/hr at 09/25/20 0747    Scheduled Medications: . calcium gluconate  2 g Intravenous Once  . capsaicin   Topical BID  . Chlorhexidine Gluconate Cloth  6 each Topical Daily  . insulin aspart  0-20 Units Subcutaneous Q4H  . insulin aspart  4 Units Subcutaneous Q4H  . insulin detemir  15 Units Subcutaneous BID  . lidocaine   Topical BID  . magic mouthwash  10 mL Oral TID  . mouth rinse  15 mL Mouth Rinse BID  . multivitamin with minerals  1 tablet Per Tube Daily  . pantoprazole  40 mg Intravenous Q12H  . sodium chloride flush  10-40 mL Intracatheter Q12H  . thiamine  injection  100 mg Intravenous Daily    have reviewed scheduled and prn medications.  Physical Exam: General: Critically ill looking male, only able to open eyes with the name Heart:RRR, s1s2 nl Lungs: Clear anteriorly, no increased work of breathing Abdomen:soft, Non-tender, non-distended Extremities: LE edema present not much change Dialysis Access: L IJ temporary HD catheter in place  Eugene Zeiders Prasad Leonell Lobdell 09/25/2020,8:33 AM  LOS: 12 days

## 2020-09-25 NOTE — Progress Notes (Signed)
Occupational Therapy Treatment Patient Details Name: Dennis Howard. MRN: [id number] DOB: 1965-11-08 Today's Date: 09/25/2020    History of present illness 55 y/o man who presented to [facility] on 4/15 with  AMS, cardiogenic + septic shock in setting of PNA + hepatic encephalopathy. Also with LV thrombus and NSTEMI.    MRI showing 9mm acute infarction at the inferior cerebellum on left and 37mm acute infarction at the left parietooccipital junction. Subacute ischemic changes of the cortical subcortical brain.  He has associated lower extremity swelling, weight gain, shortness of breath, and cough. Pending workup for neuromuscular disorder. S/P L quadriceps biopsy 4/20 resulted in left thigh hematoma. 4/25 back on pressors and CRRT initiated. PMH: chronic kidney disease, hypertension, Marijuana daily for 20 + years   OT comments  Pt progressing to EOB tasks and chair position tasks. Pt performing bed mobility with nearly totalA +2 sitting EOB x10 mins with minguardA to minA for stability. Pt requiring cues after x5 mins of sitting EOB to keep head up. Pt performing BUE HEP in chair position and pt performing light elbow and cervical ROM at EOB. Pt would greatly benefit from continued OT skilled servicse. OT following acute      Follow Up Recommendations  CIR;Supervision/Assistance - 24 hour    Equipment Recommendations  None recommended by OT    Recommendations for Other Services      Precautions / Restrictions Precautions Precautions: Fall Precaution Comments: CRRT; left thigh hematoma s/p muscle biopsy (low platelets) Restrictions Weight Bearing Restrictions: No       Mobility Bed Mobility Overal bed mobility: Needs Assistance Bed Mobility: Supine to Sit;Sit to Supine     Supine to sit: +2 for physical assistance;Max assist Sit to supine: +2 for physical assistance;Max assist   General bed mobility comments: elevated to chair position with good tolerance; pivoted to sit EOB using  bed pad and pt initiating moving legs toward EOB; cues to sit upright    Transfers                 General transfer comment: deferred for safety    Balance Overall balance assessment: Needs assistance Sitting-balance support: Feet unsupported;No upper extremity supported Sitting balance-Leahy Scale: Poor Sitting balance - Comments: occasional prop on left elbow; minguard assist EOB x 10 minutes                                   ADL either performed or assessed with clinical judgement   ADL Overall ADL's : Needs assistance/impaired                                     Functional mobility during ADLs: Total assistance;+2 for physical assistance;+2 for safety/equipment General ADL Comments: overall total assist for ADL, able to engage minimally in grooming due to fatigue and weakness.     Vision       Perception     Praxis      Cognition Arousal/Alertness: Lethargic;Suspect due to medications Behavior During Therapy: Flat affect Overall Cognitive Status: Impaired/Different from baseline Area of Impairment: Memory;Following commands;Safety/judgement;Awareness;Problem solving;Attention                 Orientation Level:  (NT) Current Attention Level: Sustained Memory: Decreased recall of precautions;Decreased short-term memory Following Commands: Follows one step commands inconsistently;Follows one step commands with increased time  Safety/Judgement: Decreased awareness of safety;Decreased awareness of deficits Awareness: Intellectual Problem Solving: Slow processing;Decreased initiation;Difficulty sequencing;Requires verbal cues;Requires tactile cues General Comments: no verbalizations but answers yes/no ?'s with head nods/shakes; slow/delayed motor responses in legs (rt worse than left); showing thumbs up and shaking head no for pain at EOB        Exercises Exercises: General Lower Extremity;General Upper Extremity General  Exercises - Upper Extremity Shoulder Flexion: AAROM;Both;10 reps;Supine Shoulder ABduction: AAROM;Right;5 reps Shoulder ADduction: AROM;Right;5 reps Elbow Flexion: Both;AAROM;5 reps;Seated;10 reps Elbow Extension: Both;Supine;AAROM;5 reps Digit Composite Flexion: AROM;Both;5 reps Composite Extension: AROM;Both;5 reps General Exercises - Lower Extremity Ankle Circles/Pumps: AROM;Both;Supine (denies pain with AROM, but with limited ROM) Long Arc Quad: Both;Seated;5 reps;PROM Hip Flexion/Marching: Both;Seated;PROM   Shoulder Instructions       General Comments O2 >90% on RA; BP stable throughout at 138/107 (116), 112 BPM, 97 % O2 with exertion at EOB.    Pertinent Vitals/ Pain       Pain Assessment: Faces Faces Pain Scale: Hurts little more Pain Location: B/L feet, L thigh Pain Descriptors / Indicators: Guarding Pain Intervention(s): Monitored during session;Limited activity within patient's tolerance  Home Living                                          Prior Functioning/Environment              Frequency  Min 2X/week        Progress Toward Goals  OT Goals(current goals can now be found in the care plan section)  Progress towards OT goals: Progressing toward goals  Acute Rehab OT Goals Patient Stated Goal: none stated OT Goal Formulation: Patient unable to participate in goal setting Time For Goal Achievement: 10/03/20 Potential to Achieve Goals: Fair ADL Goals Pt Will Perform Grooming: with min assist;sitting;bed level Pt Will Perform Upper Body Bathing: with min assist;sitting;bed level Pt Will Transfer to Toilet: with mod assist;with +2 assist;squat pivot transfer;stand pivot transfer;bedside commode Additional ADL Goal #1: Pt will complete bed mobility with mod assist +2 and maintain sitting balance at EOB with min assist as precursor to ADLs. Additional ADL Goal #2: Patient will attend to and follow 2-3 step commands during ADL tasks with  no more than minimal cueing.  Plan Discharge plan remains appropriate    Co-evaluation    PT/OT/SLP Co-Evaluation/Treatment: Yes Reason for Co-Treatment: Complexity of the patient's impairments (multi-system involvement) PT goals addressed during session: Mobility/safety with mobility;Balance;Strengthening/ROM OT goals addressed during session: ADL's and self-care      AM-PAC OT "6 Clicks" Daily Activity     Outcome Measure   Help from another person eating meals?: Total Help from another person taking care of personal grooming?: A Lot Help from another person toileting, which includes using toliet, bedpan, or urinal?: Total Help from another person bathing (including washing, rinsing, drying)?: Total Help from another person to put on and taking off regular upper body clothing?: Total Help from another person to put on and taking off regular lower body clothing?: Total 6 Click Score: 7    End of Session Equipment Utilized During Treatment: Oxygen  OT Visit Diagnosis: Other abnormalities of gait and mobility (R26.89);Muscle weakness (generalized) (M62.81);Other symptoms and signs involving the nervous system (R29.898);Other symptoms and signs involving cognitive function   Activity Tolerance Patient limited by lethargy;Patient limited by fatigue   Patient Left in bed;with call bell/phone within  reach;with nursing/sitter in room   Nurse Communication Mobility status        Time: [phone] OT Time Calculation (min): 36 min  Charges: OT General Charges $OT Visit: 1 Visit OT Treatments $Therapeutic Activity: 8-22 mins  Flora Lipps, OTR/L Acute Rehabilitation Services Pager: [phone] Office: [phone]    Damon Baisch C 09/25/2020, 1:43 PM

## 2020-09-25 NOTE — Progress Notes (Signed)
Physical Therapy Treatment Patient Details Name: Dennis Howard. MRN: [id number] DOB: 12-Oct-1965 Today's Date: 09/25/2020    History of Present Illness 55 y/o man who presented to [facility] on 4/15 with  AMS, cardiogenic + septic shock in setting of PNA + hepatic encephalopathy. Also with LV thrombus and NSTEMI.    MRI showing 76mm acute infarction at the inferior cerebellum on left and 20mm acute infarction at the left parietooccipital junction. Subacute ischemic changes of the cortical subcortical brain.  He has associated lower extremity swelling, weight gain, shortness of breath, and cough. Pending workup for neuromuscular disorder. S/P L quadriceps biopsy 4/20 resulted in left thigh hematoma. 4/25 back on pressors and CRRT initiated. PMH: chronic kidney disease, hypertension, Marijuana daily for 20 + years    PT Comments    Patient on CRRT during session (access left IJ). VSS during sitting EOB with pt initially able to hold himself upright at EOB with minguard assist, progressing to propping on his left elbow with min assist as he fatigued. Pt with poor effort with moving LEs while maintaining sitting, however with good effort once supine and lifting legs to place pillows under his legs. Patient tolerated PROM at ankles to neutral dorsiflexion (avoided touching his toes--?if that is source of pain in his feet).    Follow Up Recommendations  LTACH     Equipment Recommendations  Rolling walker with 5" wheels;3in1 (PT)    Recommendations for Other Services       Precautions / Restrictions Precautions Precautions: Fall Precaution Comments: CRRT; left thigh hematoma s/p muscle biopsy (low platelets) Restrictions Weight Bearing Restrictions: No    Mobility  Bed Mobility Overal bed mobility: Needs Assistance Bed Mobility: Supine to Sit;Sit to Supine     Supine to sit: +2 for physical assistance;Max assist Sit to supine: +2 for physical assistance;Max assist   General bed mobility  comments: elevated to chair position with good tolerance; pivoted to sit EOB using bed pad and pt assisting with moving legs toward EOB to some extent; assisted with lowering torso and lifting legs on return to supine    Transfers                 General transfer comment: deferred for safety  Ambulation/Gait                 Stairs             Wheelchair Mobility    Modified Rankin (Stroke Patients Only) Modified Rankin (Stroke Patients Only) Pre-Morbid Rankin Score: No symptoms Modified Rankin: Severe disability     Balance Overall balance assessment: Needs assistance Sitting-balance support: Feet unsupported;No upper extremity supported Sitting balance-Leahy Scale: Poor Sitting balance - Comments: occasional prop on left elbow; minguard assist EOB x 10 minutes                                    Cognition Arousal/Alertness: Lethargic;Suspect due to medications Behavior During Therapy: Flat affect Overall Cognitive Status: Impaired/Different from baseline Area of Impairment: Memory;Following commands;Safety/judgement;Awareness;Problem solving;Attention                 Orientation Level:  (NT) Current Attention Level: Sustained Memory: Decreased recall of precautions;Decreased short-term memory Following Commands: Follows one step commands inconsistently;Follows one step commands with increased time Safety/Judgement: Decreased awareness of safety;Decreased awareness of deficits Awareness: Intellectual Problem Solving: Slow processing;Decreased initiation;Difficulty sequencing;Requires verbal cues;Requires tactile cues General Comments:  no verbalizations but answers yes/no ?'s with head nods/shakes; slow/delayed motor responses in legs (lt worse than rt); showing thumbs up and shaking head no for pain at EOB      Exercises General Exercises - Lower Extremity Ankle Circles/Pumps: AROM;Both;Supine (denies pain with AROM, but with  limited ROM) Long Arc Quad: Both;Seated;5 reps;PROM Hip Flexion/Marching: Both;Seated;PROM    General Comments General comments (skin integrity, edema, etc.): on room air during sitting EOB wiht sats 98-100%; BP stable during transition to and while seated EOB      Pertinent Vitals/Pain Pain Assessment: Faces Faces Pain Scale: Hurts little more Pain Location: B/L feet, L thigh Pain Descriptors / Indicators: Guarding Pain Intervention(s): Limited activity within patient's tolerance;Monitored during session;Premedicated before session;Repositioned    Home Living Family/patient expects to be discharged to:: Private residence Living Arrangements: Parent Available Help at Discharge: Family;Available PRN/intermittently Type of Home: House Home Access: Level entry   Home Layout: Multi-level;Bed/bath upstairs Home Equipment: None      Prior Function Level of Independence: Independent      Comments: Drove for Door Dash   PT Goals (current goals can now be found in the care plan section) Acute Rehab PT Goals Patient Stated Goal: none stated Time For Goal Achievement: 10/01/20 Potential to Achieve Goals: Good Progress towards PT goals: Progressing toward goals    Frequency    Min 3X/week      PT Plan Current plan remains appropriate    Co-evaluation PT/OT/SLP Co-Evaluation/Treatment: Yes Reason for Co-Treatment: Complexity of the patient's impairments (multi-system involvement);For patient/therapist safety;To address functional/ADL transfers PT goals addressed during session: Mobility/safety with mobility;Balance;Strengthening/ROM OT goals addressed during session: ADL's and self-care      AM-PAC PT "6 Clicks" Mobility   Outcome Measure  Help needed turning from your back to your side while in a flat bed without using bedrails?: Total Help needed moving from lying on your back to sitting on the side of a flat bed without using bedrails?: Total Help needed moving to  and from a bed to a chair (including a wheelchair)?: Total Help needed standing up from a chair using your arms (e.g., wheelchair or bedside chair)?: Total Help needed to walk in hospital room?: Total Help needed climbing 3-5 steps with a railing? : Total 6 Click Score: 6    End of Session   Activity Tolerance: Patient limited by fatigue Patient left: in bed;with call bell/phone within reach;with bed alarm set (bed in chair position) Nurse Communication: Mobility status PT Visit Diagnosis: Unsteadiness on feet (R26.81);Muscle weakness (generalized) (M62.81)     Time: [id number] PT Time Calculation (min) (ACUTE ONLY): 35 min  Charges:  $Therapeutic Activity: 8-22 mins                      Jerolyn Center, PT Pager [phone]    Zena Amos 09/25/2020, 10:46 AM

## 2020-09-25 NOTE — Progress Notes (Addendum)
NAME:  Dennis Howard., MRN:  [id number], DOB:  11-09-1965, LOS: 12 ADMISSION DATE:  09-21-20, CONSULTATION DATE: 2020-09-21 REFERRING MD: Wilkie Aye - EM CHIEF COMPLAINT:  Altered Mental Status  History of Present Illness:  55 yo male former smoker presented with altered mental status that progressed for one week prior to admission.  This was associated with lower leg swelling with blisters, weight gain, dyspnea, and cough.  Also had progressive muscle weakness.  Had recent outpt treatment for pneumonia with doxycycline.  Pertinent  Medical History  CKD III - sr cr 2013 1.6, HTN, Bronchitis, THC Use    Significant Hospital Events: Including procedures, antibiotic start and stop dates in addition to other pertinent events   . 4/15 Admitted to ICU. Treated with azithro, ceftriaxone, flagyl, unasyn. Tylenol, acetaminophen, ETOH, influenza, COVID, hepatitis, urine strep antigen, legionella, & UDS negative.  . 4/16 ECHO with global hypokinesis, anteroseptal wall and apex are akinetic, anterior wall severely hypokinetic, very large layered semi-mobile apical thrombus extending from the apex along the anteroseptal wall, LVEF ~15-20%, RV systolic function mild to moderately reduced.  tarted on dobuatmine drip. ANA negative. Blood cultures negative.  . 4/17 Unasyn stopped, cefepime initiated  . 4/18 Dobutamine at , net neg 1.7L, tolerating soft diet, no n/v . 4/19 Dobutamine increased to 7.23mcg . 4/20 MRI Brain >> 73mm acute infarction at the inferior cerebellum on left and 66mm acute infarction at the left parietooccipital junction. Subacute ischemic changes of the cortical subcortical brain. . 4/22 Thigh hematoma post biopsy . 4/23 Dobutamine at 6.61mcg, pt on heparin gtt, PPI gtt. Muscle biopsy pending.  . 4/24 dark colored bowel movement, hemoglobin stable . 4/25 CK 32K, CRRT started . 4/26 PLEX started yesterday.   Interim History / Subjective:  On CRRT, no acute events overnight.   Objective    Blood pressure (!) 123/96, pulse (!) 113, temperature 98.6 F (37 C), temperature source Axillary, resp. rate 10, height 6' (1.829 m), weight 78.3 kg, SpO2 100 %. CVP:  [0 mmHg-11 mmHg] 6 mmHg      Intake/Output Summary (Last 24 hours) at 09/25/2020 1019 Last data filed at 09/25/2020 1000 Gross per 24 hour  Intake 2658.81 ml  Output 4184 ml  Net -1525.19 ml   Filed Weights   09/23/20 0446 09/24/20 0430 09/25/20 0500  Weight: 80.3 kg 78.5 kg 78.3 kg    Examination:  General:  Acutely ill appearing male in NAD Neuro:  Lethargic, will arouse and respond to questions with one word answers.  HEENT:  /AT, No JVD noted, PERRL Cardiovascular:  RRR, no MRG Lungs:  Bibasilar crackles Abdomen:  Soft, non-distended, non-tender Musculoskeletal:  Bilateral lower extremity 4+ pitting.  Distal pulses dopplerable.  Skin:  Intact, MMM   Resolved Hospital Problem list   Encephalopathy  Assessment & Plan:   Diffuse muscle weakness with elevated CPK and aldolase - mother raised questions of poisoning due to recent relationship issues for the patient.  - Pulse steroids day 2 - 4/21 muscle biopsy still pending, wont be back until next week per heme - Empiric / salvage PLEX day 2 today - Tickborne panel sent 4/26 pending - heavy metals pending - volatiles negative  Acute systolic CHF with cardiogenic shock: LVEF 15% with severe global HK by echo. LV thrombus Hx of HTN, HLD  - continue dobutamine. Levophed started yesterday targeted towards co-ox which unfortunately continues to fall.  - pressors per heart failure service.  - Heparin infusion on hold to rule out HIT.  Antibody pending.  - hold outpt lipitor, norvasc, lisinopril  Pulmonary infiltrates: completed ABX 4/23 - f/u CXR intermittently - Steroids as above   Anemia, thrombocytopenia:  Hematology following. Not felt likely to be hemolytic process despite elevated LDH. ADAMTS13 38% so too high for TTP,. TAMOF? Meets criteria but  no improvement with steroids.  Melanotic stool: Hgb slowly falling. 8.1 today.  - PLEX - Unclear if there is a role for platelet transfusion. Will defer to heme.  - Pulse steroids x 3 days through 4/29 - Stop heparin to assess for HIT. Antibody pending.   Steroid induced hyperglycemia - SSI resistant scale with tube feed coverage at 4 units q4h, levemir 15 units bid   AKI on CKD III: due to rhabdo + ischemic ATN secondary to cardiogenic shock. CK peaked at 42K Hypernatremia.  CKD 3B - CRRT ongoing, will hold for PLEX as needed.  - Follow BMP - Appreciate nephrology assistance - Vascular surgery not impressed for compartment syndrome of lower extremities.   Dysphagia.  Moderate protein calorie malnutrition - continue tube feeds  Acute embolic CVA - Neurology has signed off - Will need MRA brain and carotid ultrasound once more stable.  - Call neurology back when muscle biopsy returns   Elevated LFTs. Congestive Hepatopathy -follow LFT trend, continuing to trend up  Thrush - improved - magic mouth wash prn  Best practice (right click and "Reselect all SmartList Selections" daily)  Diet:  Tube Feed , NPO Pain/Anxiety/Delirium protocol (if indicated): No VAP protocol (if indicated): Not indicated DVT prophylaxis: Contraindicated GI prophylaxis: PPI BID Glucose control:  SSI Yes and Basal insulin Yes Central venous access:  Yes, and it is still needed Arterial line:  N/A Foley:  Yes, and it is still needed,  Mobility:  bed rest  PT consulted: N/A Last date of multidisciplinary goals of care discussion - ongoing Code Status:  full code Disposition: ICU  Labs:   CMP Latest Ref Rng & Units 09/25/2020 09/25/2020 09/24/2020  Glucose 70 - 99 mg/dL 884(Z) 660(Y) 301(S)  BUN 6 - 20 mg/dL 01(U) 93(A) 35(T)  Creatinine 0.61 - 1.24 mg/dL 7.32(K) 0.25(K) 2.70(W)  Sodium 135 - 145 mmol/L 137 137 137  Potassium 3.5 - 5.1 mmol/L 4.8 5.4(H) 5.7(H)  Chloride 98 - 111 mmol/L 103 101  103  CO2 22 - 32 mmol/L 25 26 23   Calcium 8.9 - 10.3 mg/dL 7.3(L) 7.4(L) 6.9(L)  Total Protein 6.5 - 8.1 g/dL - 5.3(L) -  Total Bilirubin 0.3 - 1.2 mg/dL - 1.7(H) -  Alkaline Phos 38 - 126 U/L - 100 -  AST 15 - 41 U/L - 282(H) -  ALT 0 - 44 U/L - 288(H) -    CBC Latest Ref Rng & Units 09/25/2020 09/24/2020 09/23/2020  WBC 4.0 - 10.5 K/uL 31.6(H) 36.2(H) 37.2(H)  Hemoglobin 13.0 - 17.0 g/dL 8.1(L) 8.7(L) 8.1(L)  Hematocrit 39.0 - 52.0 % 26.2(L) 27.6(L) 25.8(L)  Platelets 150 - 400 K/uL 21(LL) 26(LL) 59(L)    ABG    Component Value Date/Time   PHART 7.540 (H) 09/19/2020 1650   PCO2ART 27.2 (L) 09/19/2020 1650   PO2ART 113 (H) 09/19/2020 1650   HCO3 23.3 09/19/2020 1650   TCO2 24 09/19/2020 1650   ACIDBASEDEF 1.8 09/14/2020 0031   O2SAT 34.4 09/25/2020 0315    CBG (last 3)  Recent Labs    09/24/20 2333 09/25/20 0319 09/25/20 0729  GLUCAP 167* 147* 131*    Critical care time: 46 minutes    09/27/20  Mikey Bussing, AGACNP-BC [facility]  See Amion for personal pager PCCM on call pager [phone] until 7pm Please call Elink 7p-7a. [phone]  09/25/2020 10:19 AM

## 2020-09-25 NOTE — Progress Notes (Signed)
Pharmacy Heparin Induced Thrombocytopenia (HIT) Note:  Dennis Howard. is an 55 y.o. male being evaluated for HIT. Heparin was started 4/16 for LV thrombus, and baseline platelets were 106.   HIT labs were ordered on 4/26 when platelets dropped to 21.  Auto-populate labs:  Heparin Induced Plt Ab  Date/Time Value Ref Range Status  09/24/2020 02:12 PM 2.820 (H) 0.000 - 0.400 OD Final    Comment:    (NOTE) Performed At: [facility] [place] [address] [place], [place] [id number] Jolene Schimke MD YW:[id number]      CALCULATE SCORE:  4Ts (see the HIT Algorithm) Score  Thrombocytopenia 2  Timing 2  Thrombosis 0 (no new thrombosis known since heparin started)  Other causes of thrombocytopenia 2  Total 6     Recommendations (A or B) are based on available lab results (HIT antibody and/or SRA) and the HIT algorithm    A. HIT antibody result available  Possible HIT    Order SRA:  Yes  Discontinue heparin / LMWH: already discontinued since 4/26 due to low platelets  Initiate alternative anticoagulation:  No, holding anticoagulation due to low platelets  Document heparin allergy:  Yes    B. SRA result availability  SRA not available, ordered 4/27   Name of MD Contacted: Dr. Gaynell Face  Plan (Discussed with provider) Labs ordered:  SRA ordered  Heparin allergy:  Heparin allergy documented or updated. Anticoagulation plans: Continue holding anticoagulation, consider alternative anticoagulation if needed  Laverna Peace, PharmD PGY-1 Pharmacy Resident 09/25/2020 4:16 PM Please see AMION for all pharmacy numbers

## 2020-09-25 NOTE — Plan of Care (Signed)
  Problem: Nutrition: Goal: Adequate nutrition will be maintained Outcome: Progressing Note: Pt is tolerating tube feeds well at goal.   Problem: Elimination: Goal: Will not experience complications related to bowel motility Outcome: Progressing Goal: Will not experience complications related to urinary retention Outcome: Progressing   Problem: Safety: Goal: Ability to remain free from injury will improve Outcome: Progressing   Problem: Skin Integrity: Goal: Risk for impaired skin integrity will decrease Outcome: Progressing   Problem: Activity: Goal: Risk for activity intolerance will decrease Outcome: Not Progressing Note: Pt is too weak to mobilize at this time.

## 2020-09-25 NOTE — Progress Notes (Signed)
IP PROGRESS NOTE  Subjective:   Mr. Craddock appears unchanged.  Objective: Vital signs in last 24 hours: Blood pressure (!) 130/92, pulse (!) 110, temperature 97.8 F (36.6 C), temperature source Axillary, resp. rate 11, height 6' (1.829 m), weight 172 lb 9.9 oz (78.3 kg), SpO2 100 %.  Intake/Output from previous day: 04/26 0701 - 04/27 0700 In: 3199.3 [I.V.:858.7; NG/GT:2145.1; IV Piggyback:195.6] Out: 4262 [Urine:14; Stool:10]  Physical Exam:  HEENT: no bleeding, nasal feeding tube in place, no thrush Abdomen: Soft, no hepatosplenomegaly, nontender Extremities: Trace edema at the lower leg bilaterally Musculoskeletal: There is swelling at the left mid and upper thigh Neurologic: Alert, follows simple commands, moves all extremities to command, oriented to place  Skin: No ecchymoses  Right and left IJ catheter sites without evidence of infection or bleeding  Lab Results: Recent Labs    09/24/20 0510 09/25/20 0315  WBC 36.2* 31.6*  HGB 8.7* 8.1*  HCT 27.6* 26.2*  PLT 26* 21*  ADAMTS13 on 09/17/2020 - 38.2% Blood smear 09/24/2020: The platelets are decreased in number, no platelet clumps.  There is a leukocytosis.  The majority the white cells are mature neutrophils.  No blast or other young forms are seen.  Increased nucleated red cells.  The polychromasia is increased.  There are burr cells, target cells, acanthocytes, few ovalocytes, and a few schistocytes. BMET Recent Labs    09/25/20 0315 09/25/20 0759  NA 137 137  K 5.4* 4.8  CL 101 103  CO2 26 25  GLUCOSE 152* 147*  BUN 89* 80*  CREATININE 2.43* 2.24*  CALCIUM 7.4* 7.3*   Bilirubin 1.6, LDH 594 No results found for: CEA1  Studies/Results: DG CHEST PORT 1 VIEW  Result Date: 09/23/2020 CLINICAL DATA:  Central line placement. EXAM: PORTABLE CHEST 1 VIEW COMPARISON:  Radiograph 09/20/2020 FINDINGS: Right central line is stable in position with tip in the lower SVC. Left central line is new with tip projecting  over the mid lower SVC. A stylet may be in place. No visualized pneumothorax. Weighted enteric tube in place with tip below the diaphragm. Mild cardiomegaly no pulmonary edema or focal airspace disease. No pleural effusion. IMPRESSION: 1. New left central line with tip projecting over the mid lower SVC. No pneumothorax. 2. Stable right central line with tip in the SVC. 3. Stable mild cardiomegaly. Electronically Signed   By: Narda Rutherford M.D.   On: 09/23/2020 16:35    Medications: I have reviewed the patient's current medications.  Assessment/Plan: 1.  Pulmonary infiltrates 2.  Chronic/acute renal failure 3.  Cardiogenic shock on hospital admission 4.  Liver failure secondary to cardiogenic shock, underlying chronic liver disease? 5.  Anemia 6.  Thrombocytopenia  Daily plasmapheresis 09/24/2020  Solu-Medrol 09/24/2020 7.   Coagulopathy 8.   Altered mental status-hepatic encephalopathy? 9.   Rhabdomyolysis 10.  Oral candidiasis 11.  Muscle weakness-status post a muscle biopsy 10/04/2020 12.  Multiple acute CVAs on MRI 10/04/20-likely embolic related to the LV thrombus, heparin currently on hold  Mr. Stellmach appears unchanged.  He remains critically ill with multiorgan failure.  The hemoglobin and platelet count have not changed significantly.  He completed 1 treatment with plasma exchange yesterday.  The plan is to continue daily plasma exchange.  The CK and LDH are partially improved today. Recommendations:  1.  Transfuse packed red blood cells as needed 2.  Daily CBC 3.  Follow-up muscle biopsy result-I left a message yesterday for the consulting pathologist to page me 4.  Steroids and  anticoagulation as recommended by the critical care and neurology services 5.  CRRT per Nephrology.  6.  Continue trial of daily plasmapheresis    LOS: 12 days

## 2020-09-25 NOTE — Progress Notes (Addendum)
Advanced Heart Failure Rounding Note  PCP-Cardiologist: No primary care provider on file.    Patient Profile   55 y/o male w/ no prior cardiac history, admitted for mixed cardiogenic + septic shock in setting of PNA + hepatic encephalopathy.   ECHO LVEF 15% with severe global HK with regional variability + LV clot RV moderately down. Initial Co-ox 48%. Started on DBA.   On CVVHD   Subjective:    Remains on DBA 6.5 mcg/kg/min + NE 15 mcg.   CO-OX 45% -> 33%-> 34%.    Tolerating CVVHD  Platelets 21 k  Remains weak.    Objective:   Weight Range: 78.3 kg Body mass index is 23.41 kg/m.   Vital Signs:   Temp:  [97.4 F (36.3 C)-99.6 F (37.6 C)] 98.7 F (37.1 C) (04/27 1525) Pulse Rate:  [107-115] 114 (04/27 1545) Resp:  [10-32] 13 (04/27 1545) BP: (110-140)/(64-104) 116/80 (04/27 1545) SpO2:  [96 %-100 %] 100 % (04/27 1545) Weight:  [78.3 kg] 78.3 kg (04/27 0500) Last BM Date: 09/24/20  Weight change: Filed Weights   09/23/20 0446 09/24/20 0430 09/25/20 0500  Weight: 80.3 kg 78.5 kg 78.3 kg    Intake/Output:   Intake/Output Summary (Last 24 hours) at 09/25/2020 1552 Last data filed at 09/25/2020 1500 Gross per 24 hour  Intake 2940.95 ml  Output 3752 ml  Net -811.05 ml      Physical Exam  General:  Weak . No resp difficulty HEENT: normal Neck: supple. JVP to jaw. LIJ HD.  Carotids 2+ bilat; no bruits. No lymphadenopathy or thryomegaly appreciated. Cor: PMI nondisplaced. Regular rate & rhythm. No rubs, gallops or murmurs. Lungs: clear Abdomen: soft, nontender, nondistended. No hepatosplenomegaly. No bruits or masses. Good bowel sounds. Extremities: no cyanosis, clubbing, rash, R and LLE 3+ edema. Left thigh hematoma Neuro: Weak alert & orientedx3, cranial nerves grossly intact. moves all 4 extremities w/o difficulty. Affect flat.   Telemetry   Sinus Tach 100-110 Personally reviewed  Labs    CBC Recent Labs    09/24/20 0510 09/25/20 0315   WBC 36.2* 31.6*  HGB 8.7* 8.1*  HCT 27.6* 26.2*  MCV 96.5 96.3  PLT 26* 21*   Basic Metabolic Panel Recent Labs    93/81/01 0510 09/24/20 1601 09/25/20 0315 09/25/20 0759  NA 138   < > 137 137  K 5.6*   < > 5.4* 4.8  CL 103   < > 101 103  CO2 22   < > 26 25  GLUCOSE 155*   < > 152* 147*  BUN 107*   < > 89* 80*  CREATININE 2.89*   < > 2.43* 2.24*  CALCIUM 7.3*   < > 7.4* 7.3*  MG 2.7*  --  2.7*  --   PHOS 4.5   < > 4.4 4.3   < > = values in this interval not displayed.   Liver Function Tests Recent Labs    09/24/20 0510 09/24/20 1601 09/25/20 0315 09/25/20 0759  AST 496*  --  282*  --   ALT 630*  --  288*  --   ALKPHOS 188*  --  100  --   BILITOT 1.9*  --  1.7*  --   PROT 5.4*  --  5.3*  --   ALBUMIN 2.1*   < > 2.8* 2.8*   < > = values in this interval not displayed.   No results for input(s): LIPASE, AMYLASE in the last 72 hours. Cardiac Enzymes  Recent Labs    09/23/20 0755 09/24/20 0510 09/25/20 0759  CKTOTAL 32,320* 42,626* 33,136*    BNP: BNP (last 3 results) Recent Labs    09/15/2020 2114 09/14/20 0031 09/15/20 0321  BNP 1,447.9* 1,523.7* 743.8*    ProBNP (last 3 results) No results for input(s): PROBNP in the last 8760 hours.   D-Dimer No results for input(s): DDIMER in the last 72 hours. Hemoglobin A1C No results for input(s): HGBA1C in the last 72 hours. Fasting Lipid Panel No results for input(s): CHOL, HDL, LDLCALC, TRIG, CHOLHDL, LDLDIRECT in the last 72 hours. Thyroid Function Tests No results for input(s): TSH, T4TOTAL, T3FREE, THYROIDAB in the last 72 hours.  Invalid input(s): FREET3  Other results:   Imaging    No results found.   Medications:     Scheduled Medications: . calcium carbonate  2 tablet Per Tube Q3H  . calcium gluconate  2 g Intravenous Once  . capsaicin   Topical BID  . chlorhexidine  15 mL Mouth Rinse BID  . Chlorhexidine Gluconate Cloth  6 each Topical Daily  . insulin aspart  0-20 Units  Subcutaneous Q4H  . insulin aspart  4 Units Subcutaneous Q4H  . insulin detemir  15 Units Subcutaneous BID  . lidocaine   Topical BID  . magic mouthwash  10 mL Oral TID  . mouth rinse  15 mL Mouth Rinse BID  . multivitamin with minerals  1 tablet Per Tube Daily  . pantoprazole  40 mg Intravenous Q12H  . sodium chloride flush  10-40 mL Intracatheter Q12H  . thiamine injection  100 mg Intravenous Daily    Infusions: . sodium chloride 10 mL/hr at 09/25/20 1500  . anticoagulant sodium citrate    . calcium gluconate Stopped (09/25/20 1040)  . citrate dextrose    . citrate dextrose Stopped (09/24/20 1900)  . DOBUTamine 6.5 mcg/kg/min (09/25/20 1500)  . feeding supplement (PIVOT 1.5 CAL) 1,000 mL (09/25/20 0625)  . methylPREDNISolone (SOLU-MEDROL) injection Stopped (09/25/20 1337)  . norepinephrine (LEVOPHED) Adult infusion 15 mcg/min (09/25/20 1500)  . prismasol BGK 2/2.5 dialysis solution 1,500 mL/hr at 09/25/20 0657  . prismasol BGK 2/2.5 replacement solution 500 mL/hr at 09/25/20 0840  . prismasol BGK 2/2.5 replacement solution 300 mL/hr at 09/25/20 0747    PRN Medications: sodium chloride, acetaminophen (TYLENOL) oral liquid 160 mg/5 mL, diphenhydrAMINE, docusate, heparin, oxyCODONE, sodium chloride flush    Patient Profile   55 y/o male w/ no prior cardiac history, admitted for mixed cardiogenic + septic shock in setting of PNA + hepatic encephalopathy.   ECHO LVEF 15% with severe global HK with regional variability + LV clot RV moderately down. Initial Co-ox 48%. Started on DBA.    Assessment/Plan   1. Cardiogenic shock with multisystem organ failure - ECHO LVEF 15% with severe global HK with regional variability + LV clot RV moderately down. - Remains on DBA 6.5. NE 15 added CO-OX 45% -> 33%>34%.  -On CVVHD for volume removal  - Unifying diagnosis remains unclear. Serologies unrevealing so far. Await result of muscle biopsy.  - He is actively dying. Suspect underlying  myopathic process that has taken away his cardiac reserve as well - No response to inotropes or steroids. - Continue inotropes for now.    2. LV thrombus - back on heparin. HGB stble - consider holding heparin with PLTs 21k   3. Shock liver with hepatic encephalopathy - Suspect due to low output HF. Transaminases improving with hemodynamics support - Liver u/s suggestive  of hepatic steatosis but not long-term cirrhosis. Though low albumin and low PLTs suggestive of more longstanding liver process  - plan as above  4. AKI due to ATN/shock - Baseline creatinine unknown  - SCr 1.5 in 2016 in Care Everywhere - Started CVVHD 4/25. continue  5. CAP - PCT 3.70 -> 2.9 - BC NGTD  -Afebrile,  WBC 37>31.6 , on steroids - was on cefepime and ancef. Off antibiotics.    6. Rhabdomyolysis/myositis with hematologic abnormalties - CK 6502>4025> 42>33K  - muscle biopsy pending  - suspect underlying inflammatory process but serologies negative so far - No response to steroids. Consider PLEX as above  7. ABLA - likely due to left thigh hematoma +/- GI bleed - stable currently  8. Thrombocytopenia PLTs trending down 77>59 > 23>21   Hematology following.   9. Leg edema  Vascular Surgery Consulted.  No compartment syndrome  Length of Stay: 12  Tonye Becket, NP  09/25/2020, 3:52 PM  Advanced Heart Failure Team Pager [phone] (M-F; 7a - 5p)  Please contact [facility] for night-coverage after hours (5p -7a ) and weekends on [email or web address]  Agree with above.   Started on plasmapheresis. Remains on DBA and NE. Co-ox very low.  CK still very elevated.   Very weak. Can barely open eyes.   General:  Weak appearing. Lethargic No resp difficulty HEENT: normal Neck: supple. JVP to ear Carotids 2+ bilat; no bruits. No lymphadenopathy or thryomegaly appreciated. Cor: PMI nondisplaced. Regular tachy + s3 Lungs: clear Abdomen: soft, nontender, nondistended. No hepatosplenomegaly. No bruits or  masses. Good bowel sounds. 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 All serologies negative so far. Awaiting on muscle biopsy results. Unfortunately I think there is little we can do to save him at this point. Will continue supportive care for now. Will need to re-address GOC.   CRITICAL CARE Performed by: Arvilla Meres  Total critical care time: 45 minutes  Critical care time was exclusive of separately billable procedures and treating other patients.  Critical care was necessary to treat or prevent imminent or life-threatening deterioration.  Critical care was time spent personally by me (independent of midlevel providers or residents) on the following activities: development of treatment plan with patient and/or surrogate as well as nursing, discussions with consultants, evaluation of patient's response to treatment, examination of patient, obtaining history from patient or surrogate, ordering and performing treatments and interventions, ordering and review of laboratory studies, ordering and review of radiographic studies, pulse oximetry and re-evaluation of patient's condition.  Arvilla Meres, MD  5:55 PM

## 2020-09-25 NOTE — Progress Notes (Signed)
   VASCULAR SURGERY ASSESSMENT & PLAN:   BILAT LEG PAIN: No evidence of compartment syndrome on either side.  The patient has Doppler signals in both feet.  His platelet count is down to 21,000.  Given his multiple medical issues and debilitated state I would not recommend an aggressive work-up for his vascular disease at this point.  SUBJECTIVE:   More alert this morning.  Answers questions appropriately.  PHYSICAL EXAM:   Vitals:   09/25/20 0320 09/25/20 0400 09/25/20 0500 09/25/20 0600  BP:  125/90 (!) 124/92 139/86  Pulse: (!) 111 (!) 110 (!) 109 (!) 114  Resp: 16 13 12 14   Temp: 98.5 F (36.9 C)     TempSrc: Axillary     SpO2: 96% 100% 100% 100%  Weight:   78.3 kg   Height:       Both calves are soft without evidence of compartment syndrome. He continues to have Doppler signals in both feet.  LABS:   Lab Results  Component Value Date   WBC 31.6 (H) 09/25/2020   HGB 8.1 (L) 09/25/2020   HCT 26.2 (L) 09/25/2020   MCV 96.3 09/25/2020   PLT 21 (LL) 09/25/2020   Lab Results  Component Value Date   CREATININE 2.43 (H) 09/25/2020   Lab Results  Component Value Date   INR 1.4 (H) 09/17/2020   CBG (last 3)  Recent Labs    09/24/20 1945 09/24/20 2333 09/25/20 0319  GLUCAP 193* 167* 147*    PROBLEM LIST:    Active Problems:   Encephalopathy acute   Protein-calorie malnutrition, severe   Cardiogenic shock (HCC)   Acute renal failure superimposed on stage 3b chronic kidney disease (HCC)   Thrombocytopenia (HCC)   Anemia due to acute blood loss   CURRENT MEDS:   . calcium gluconate  2 g Intravenous Once  . capsaicin   Topical BID  . Chlorhexidine Gluconate Cloth  6 each Topical Daily  . insulin aspart  0-20 Units Subcutaneous Q4H  . insulin aspart  4 Units Subcutaneous Q4H  . insulin detemir  15 Units Subcutaneous BID  . lidocaine   Topical BID  . magic mouthwash  10 mL Oral TID  . mouth rinse  15 mL Mouth Rinse BID  . multivitamin with minerals  1  tablet Per Tube Daily  . pantoprazole  40 mg Intravenous Q12H  . sodium chloride flush  10-40 mL Intracatheter Q12H  . thiamine injection  100 mg Intravenous Daily    09/27/20 Office: [phone] 09/25/2020

## 2020-09-26 ENCOUNTER — Inpatient Hospital Stay (HOSPITAL_COMMUNITY): Payer: BLUE CROSS/BLUE SHIELD

## 2020-09-26 DIAGNOSIS — N170 Acute kidney failure with tubular necrosis: Secondary | ICD-10-CM | POA: Diagnosis not present

## 2020-09-26 DIAGNOSIS — R918 Other nonspecific abnormal finding of lung field: Secondary | ICD-10-CM | POA: Diagnosis not present

## 2020-09-26 DIAGNOSIS — R609 Edema, unspecified: Secondary | ICD-10-CM | POA: Diagnosis not present

## 2020-09-26 DIAGNOSIS — R570 Cardiogenic shock: Secondary | ICD-10-CM | POA: Diagnosis not present

## 2020-09-26 DIAGNOSIS — N189 Chronic kidney disease, unspecified: Secondary | ICD-10-CM | POA: Diagnosis not present

## 2020-09-26 DIAGNOSIS — D649 Anemia, unspecified: Secondary | ICD-10-CM | POA: Diagnosis not present

## 2020-09-26 DIAGNOSIS — N1832 Chronic kidney disease, stage 3b: Secondary | ICD-10-CM | POA: Diagnosis not present

## 2020-09-26 DIAGNOSIS — D696 Thrombocytopenia, unspecified: Secondary | ICD-10-CM | POA: Diagnosis not present

## 2020-09-26 DIAGNOSIS — A419 Sepsis, unspecified organism: Secondary | ICD-10-CM | POA: Diagnosis not present

## 2020-09-26 LAB — THERAPEUTIC PLASMA EXCHANGE (BLOOD BANK)
Plasma Exchange: 3790
Plasma volume needed: 3790
Unit division: 0
Unit division: 0
Unit division: 0
Unit division: 0
Unit division: 0
Unit division: 0
Unit division: 0
Unit division: 0
Unit division: 0
Unit division: 0
Unit division: 0
Unit division: 0
Unit division: 0

## 2020-09-26 LAB — COOXEMETRY PANEL
Carboxyhemoglobin: 1.1 % (ref 0.5–1.5)
Methemoglobin: 0.9 % (ref 0.0–1.5)
O2 Saturation: 37.5 %
Total hemoglobin: 8.2 g/dL — ABNORMAL LOW (ref 12.0–16.0)

## 2020-09-26 LAB — RENAL FUNCTION PANEL
Albumin: 3 g/dL — ABNORMAL LOW (ref 3.5–5.0)
Albumin: 2.6 g/dL — ABNORMAL LOW (ref 3.5–5.0)
Anion gap: 11 (ref 5–15)
Anion gap: 11 (ref 5–15)
BUN: 69 mg/dL — ABNORMAL HIGH (ref 6–20)
BUN: 71 mg/dL — ABNORMAL HIGH (ref 6–20)
CO2: 26 mmol/L (ref 22–32)
CO2: 25 mmol/L (ref 22–32)
Calcium: 7.4 mg/dL — ABNORMAL LOW (ref 8.9–10.3)
Calcium: 6.9 mg/dL — ABNORMAL LOW (ref 8.9–10.3)
Chloride: 99 mmol/L (ref 98–111)
Chloride: 98 mmol/L (ref 98–111)
Creatinine, Ser: 2 mg/dL — ABNORMAL HIGH (ref 0.61–1.24)
Creatinine, Ser: 2.01 mg/dL — ABNORMAL HIGH (ref 0.61–1.24)
GFR, Estimated: 39 mL/min — ABNORMAL LOW (ref >60)
GFR, Estimated: 39 mL/min — ABNORMAL LOW (ref >60)
Glucose, Bld: 146 mg/dL — ABNORMAL HIGH (ref 70–99)
Glucose, Bld: 213 mg/dL — ABNORMAL HIGH (ref 70–99)
Phosphorus: 4.5 mg/dL (ref 2.5–4.6)
Phosphorus: 4.8 mg/dL — ABNORMAL HIGH (ref 2.5–4.6)
Potassium: 4.4 mmol/L (ref 3.5–5.1)
Potassium: 4.8 mmol/L (ref 3.5–5.1)
Sodium: 136 mmol/L (ref 135–145)
Sodium: 134 mmol/L — ABNORMAL LOW (ref 135–145)

## 2020-09-26 LAB — CBC
HCT: 25.7 % — ABNORMAL LOW (ref 39.0–52.0)
HCT: 24.6 % — ABNORMAL LOW (ref 39.0–52.0)
Hemoglobin: 8.2 g/dL — ABNORMAL LOW (ref 13.0–17.0)
Hemoglobin: 7.6 g/dL — ABNORMAL LOW (ref 13.0–17.0)
MCH: 30.5 pg (ref 26.0–34.0)
MCH: 29.8 pg (ref 26.0–34.0)
MCHC: 31.9 g/dL (ref 30.0–36.0)
MCHC: 30.9 g/dL (ref 30.0–36.0)
MCV: 95.5 fL (ref 80.0–100.0)
MCV: 96.5 fL (ref 80.0–100.0)
Platelets: 29 10*3/uL — CL (ref 150–400)
Platelets: 24 10*3/uL — CL (ref 150–400)
RBC: 2.69 MIL/uL — ABNORMAL LOW (ref 4.22–5.81)
RBC: 2.55 MIL/uL — ABNORMAL LOW (ref 4.22–5.81)
RDW: 17.6 % — ABNORMAL HIGH (ref 11.5–15.5)
RDW: 17.2 % — ABNORMAL HIGH (ref 11.5–15.5)
WBC: 32.7 10*3/uL — ABNORMAL HIGH (ref 4.0–10.5)
WBC: 28.2 10*3/uL — ABNORMAL HIGH (ref 4.0–10.5)
nRBC: 1.6 % — ABNORMAL HIGH (ref 0.0–0.2)
nRBC: 1.9 % — ABNORMAL HIGH (ref 0.0–0.2)

## 2020-09-26 LAB — COMPREHENSIVE METABOLIC PANEL
ALT: 247 U/L — ABNORMAL HIGH (ref 0–44)
AST: 271 U/L — ABNORMAL HIGH (ref 15–41)
Albumin: 3.1 g/dL — ABNORMAL LOW (ref 3.5–5.0)
Alkaline Phosphatase: 88 U/L (ref 38–126)
Anion gap: 10 (ref 5–15)
BUN: 69 mg/dL — ABNORMAL HIGH (ref 6–20)
CO2: 27 mmol/L (ref 22–32)
Calcium: 7.4 mg/dL — ABNORMAL LOW (ref 8.9–10.3)
Chloride: 99 mmol/L (ref 98–111)
Creatinine, Ser: 2.03 mg/dL — ABNORMAL HIGH (ref 0.61–1.24)
GFR, Estimated: 38 mL/min — ABNORMAL LOW (ref >60)
Glucose, Bld: 146 mg/dL — ABNORMAL HIGH (ref 70–99)
Potassium: 4.5 mmol/L (ref 3.5–5.1)
Sodium: 136 mmol/L (ref 135–145)
Total Bilirubin: 1.4 mg/dL — ABNORMAL HIGH (ref 0.3–1.2)
Total Protein: 5.6 g/dL — ABNORMAL LOW (ref 6.5–8.1)

## 2020-09-26 LAB — CK: Total CK: 25775 U/L — ABNORMAL HIGH (ref 49–397)

## 2020-09-26 LAB — GLUCOSE, CAPILLARY
Glucose-Capillary: 129 mg/dL — ABNORMAL HIGH (ref 70–99)
Glucose-Capillary: 140 mg/dL — ABNORMAL HIGH (ref 70–99)
Glucose-Capillary: 140 mg/dL — ABNORMAL HIGH (ref 70–99)
Glucose-Capillary: 172 mg/dL — ABNORMAL HIGH (ref 70–99)
Glucose-Capillary: 163 mg/dL — ABNORMAL HIGH (ref 70–99)
Glucose-Capillary: 155 mg/dL — ABNORMAL HIGH (ref 70–99)

## 2020-09-26 LAB — PHOSPHORUS: Phosphorus: 4.6 mg/dL (ref 2.5–4.6)

## 2020-09-26 LAB — MAGNESIUM: Magnesium: 2.8 mg/dL — ABNORMAL HIGH (ref 1.7–2.4)

## 2020-09-26 LAB — LACTATE DEHYDROGENASE: LDH: 1026 U/L — ABNORMAL HIGH (ref 98–192)

## 2020-09-26 MED ORDER — ACD FORMULA A 0.73-2.45-2.2 GM/100ML VI SOLN
1000.0000 mL | Status: DC
  Filled 2020-09-26: qty 1000

## 2020-09-26 MED ORDER — CALCIUM GLUCONATE-NACL 2-0.675 GM/100ML-% IV SOLN
2.0000 g | Freq: Once | INTRAVENOUS | Status: AC
  Filled 2020-09-26: qty 100

## 2020-09-26 MED ORDER — ANTICOAGULANT SODIUM CITRATE 4% (200MG/5ML) IV SOLN
5.0000 mL | Freq: Once | Status: DC
  Filled 2020-09-26: qty 5

## 2020-09-26 MED ORDER — ACD FORMULA A 0.73-2.45-2.2 GM/100ML VI SOLN
Status: AC
  Administered 2020-09-26: 1000 mL
  Filled 2020-09-26: qty 500

## 2020-09-26 MED ORDER — CALCIUM GLUCONATE-NACL 2-0.675 GM/100ML-% IV SOLN
INTRAVENOUS | Status: AC
  Administered 2020-09-26: 2000 mg via INTRAVENOUS
  Filled 2020-09-26: qty 100

## 2020-09-26 MED ORDER — DIPHENHYDRAMINE HCL 25 MG PO CAPS: 25.0000 mg | ORAL_CAPSULE | Freq: Four times a day (QID) | ORAL | Status: DC | PRN

## 2020-09-26 MED ORDER — CALCIUM CARBONATE ANTACID 500 MG PO CHEW: 2.0000 | CHEWABLE_TABLET | ORAL | Status: AC

## 2020-09-26 MED ORDER — ACETAMINOPHEN 325 MG PO TABS: 650.0000 mg | ORAL_TABLET | ORAL | Status: DC | PRN

## 2020-09-26 NOTE — Progress Notes (Signed)
[facility] NEPHROLOGY PROGRESS NOTE  Assessment/ Plan: Pt is a 55 y.o. yo male  with history of hypertension, CKD was admitted on 4/15 with altered mental status, found to have systolic CHF, respiratory failure required mechanical ventilation, rhabdomyolysis, now seen as a consultation for the evaluation of acute kidney injury.  #Acute kidney injury on CKD stage III multifactorial etiology including ischemic ATN due to cardiogenic/septic shock, severe CHF with poor renal perfusion, rhabdomyolysis.  He is on inotrope and pressors to maintain blood pressure.  Given declining urine output, low GFR, azotemia and very high CK level, CRRT started on 4/25.  I have discussed this with the patient's mother and ICU team.  CRRT tolerating well with UF around 100 cc/hour.  Currently on all 2K bath with acceptable potassium level. UF as tolerated by BP.  He is already on IV heparin. I have explained to patient's mother that he may not able to tolerate regular IHD.  #Acute metabolic encephalopathy, also has uremia:  CT scan with stroke.  #Acute systolic CHF with cardiogenic shock, LV thrombus: Seen by cardiologist.  Currently on dobutamine, heparin drip.  Not much improvement.  #Pulmonary infiltrates, pneumonia: Completed antibiotics.  #Diffuse muscle weakness with elevated CPK, nontraumatic rhabdomyolysis status post muscle biopsy pending results.  Currently on IV Solu-Medrol and plasma exchange by hematologist.  #Multiorgan failure including congestive hepatopathy, renal failure, CHF, stroke.  Currently partial code.  Looks poor prognosis.  Subjective: Seen and examined.  Remains anuric and tolerating CRRT well.  On pressors.  Plan for plasma exchange today.  No issue with CRRT. Objective Vital signs in last 24 hours: Vitals:   09/26/20 0603 09/26/20 0700 09/26/20 0728 09/26/20 0800  BP: (!) 136/98 (!) 133/99  (!) 130/100  Pulse: (!) 114 (!) 116  (!) 116  Resp: 10 13  12   Temp:    98.8 F (37.1 C)   TempSrc:   Axillary   SpO2: 100% 100%  100%  Weight:      Height:       Weight change: -1.4 kg  Intake/Output Summary (Last 24 hours) at 09/26/2020 0852 Last data filed at 09/26/2020 09/28/2020 Gross per 24 hour  Intake 2662.53 ml  Output 3971 ml  Net -1308.47 ml       Labs: Basic Metabolic Panel: Recent Labs  Lab 09/25/20 0759 09/25/20 1725 09/26/20 0320  NA 137 137 136  136  K 4.8 5.1 4.5  4.4  CL 103 97* 99  99  CO2 25 28 27  26   GLUCOSE 147* 157* 146*  146*  BUN 80* 85* 69*  69*  CREATININE 2.24* 2.52* 2.03*  2.00*  CALCIUM 7.3* 7.4* 7.4*  7.4*  PHOS 4.3 6.0* 4.6  4.5   Liver Function Tests: Recent Labs  Lab 09/24/20 0510 09/24/20 1601 09/25/20 0315 09/25/20 0759 09/25/20 1725 09/26/20 0320  AST 496*  --  282*  --   --  271*  ALT 630*  --  288*  --   --  247*  ALKPHOS 188*  --  100  --   --  88  BILITOT 1.9*  --  1.7*  --   --  1.4*  PROT 5.4*  --  5.3*  --   --  5.6*  ALBUMIN 2.1*   < > 2.8* 2.8* 2.8* 3.1*  3.0*   < > = values in this interval not displayed.   No results for input(s): LIPASE, AMYLASE in the last 168 hours. No results for input(s):  AMMONIA in the last 168 hours. CBC: Recent Labs  Lab 09/20/20 0406 09/20/20 1844 09/22/20 0242 09/22/20 1618 09/23/20 0456 09/24/20 0510 09/25/20 0315 09/26/20 0320  WBC 40.9*   < > 40.4*  --  37.2* 36.2* 31.6* 32.7*  NEUTROABS 37.8*  --   --   --   --   --   --   --   HGB 7.0*   < > 8.2*   < > 8.1* 8.7* 8.1* 8.2*  HCT 21.7*   < > 25.6*   < > 25.8* 27.6* 26.2* 25.7*  MCV 95.2   < > 94.8  --  96.6 96.5 96.3 95.5  PLT 77*   < > 77*  --  59* 26* 21* 29*   < > = values in this interval not displayed.   Cardiac Enzymes: Recent Labs  Lab 09/23/20 0755 09/24/20 0510 09/25/20 0759 09/26/20 0320  CKTOTAL 32,320* 42,626* 33,136* 25,775*   CBG: Recent Labs  Lab 09/25/20 1523 09/25/20 1936 09/25/20 2333 09/26/20 0315 09/26/20 0726  GLUCAP 160* 114* 111* 129* 140*     Iron Studies: No results for input(s): IRON, TIBC, TRANSFERRIN, FERRITIN in the last 72 hours. Studies/Results: No results found.  Medications: Infusions: . sodium chloride 10 mL/hr at 09/26/20 0800  . anticoagulant sodium citrate    . calcium gluconate Stopped (09/25/20 1040)  . citrate dextrose Stopped (09/24/20 1900)  . DOBUTamine 6.5 mcg/kg/min (09/26/20 0800)  . feeding supplement (PIVOT 1.5 CAL) 1,000 mL (09/25/20 2329)  . methylPREDNISolone (SOLU-MEDROL) injection Stopped (09/26/20 0719)  . norepinephrine (LEVOPHED) Adult infusion 15 mcg/min (09/26/20 0800)  . prismasol BGK 2/2.5 dialysis solution 1,500 mL/hr at 09/26/20 0827  . prismasol BGK 2/2.5 replacement solution 500 mL/hr at 09/26/20 0828  . prismasol BGK 2/2.5 replacement solution 300 mL/hr at 09/26/20 0446    Scheduled Medications: . calcium gluconate  2 g Intravenous Once  . capsaicin   Topical BID  . Chlorhexidine Gluconate Cloth  6 each Topical Daily  . insulin aspart  0-20 Units Subcutaneous Q4H  . insulin aspart  4 Units Subcutaneous Q4H  . insulin detemir  15 Units Subcutaneous BID  . lidocaine   Topical BID  . magic mouthwash  10 mL Oral TID  . mouth rinse  15 mL Mouth Rinse BID  . multivitamin with minerals  1 tablet Per Tube Daily  . pantoprazole  40 mg Intravenous Q12H  . sodium chloride flush  10-40 mL Intracatheter Q12H  . thiamine injection  100 mg Intravenous Daily    have reviewed scheduled and prn medications.  Physical Exam: General: Critically ill looking male, barely opens eyes with the name. Heart:RRR, s1s2 nl Lungs: Clear anteriorly, no increased work of breathing Abdomen:soft, Non-tender, non-distended Extremities: LE edema present not much change Dialysis Access: L IJ temporary HD catheter in place  Jeydi Klingel Prasad Amayrany Cafaro 09/26/2020,8:52 AM  LOS: 13 days

## 2020-09-26 NOTE — Progress Notes (Signed)
  Speech Language Pathology Treatment: Dysphagia  Patient Details Name: Dennis Howard. MRN: [id number] DOB: 08/02/1965 Today's Date: 09/26/2020 Time: 7494-4967 SLP Time Calculation (min) (ACUTE ONLY): 15.87 min  Assessment / Plan / Recommendation Clinical Impression  Pt was seen for dysphagia treatment. He was adequately alert for p.o. intake and produced limited utterances. Pt asked whether it was raining and with encouragement, he opened his eyes to look through the window, but his eyes remained closed for the majority of the session.  Mastication of dysphagia 3 solids was WNL and oral clearance was adequate. Pt did not demonstrate any symptoms of pharyngeal dysphagia, including signs of aspiration with any solids or liquids, even when challenged with consecutive swallows and dual consistency boluses. Pt requested that regular texture solids be deferred during this session. Pt's diet has been advanced to soft by the medical team with the pt's improved alertness and SLP is in agreement with this. SLP will continue to follow pt.    HPI HPI: Pt is a 55 y/o male with a past medical history of chronic kidney disease, hypertension who presented to [facility] on 4/15 with AMS and progressive muscle weakness. MRI of brain showed 5 mm acute infarction at the inferior cerebellum on the left. 6 mm acute infarction at the left parietooccipital junction. Subacute ischemic changes of the cortical and subcortical brain at the left parietooccipital junction. Dx metabolic encephalopathy, acute systolic CHF with cardiogenic shock, ARF, TAMOF. PLEX started on 4/25, pt on CRRT. Muscle biopsy done 4/21 but results pending.      SLP Plan  Continue with current plan of care       Recommendations  Diet recommendations: Dysphagia 3 (mechanical soft);Thin liquid Liquids provided via: Straw;Cup Medication Administration: Via alternative means Supervision: Trained caregiver to feed patient;Staff to assist with self  feeding Compensations: Slow rate;Small sips/bites Postural Changes and/or Swallow Maneuvers: Seated upright 90 degrees                Oral Care Recommendations: Oral care BID Follow up Recommendations: 24 hour supervision/assistance SLP Visit Diagnosis: Dysphagia, unspecified (R13.10) Plan: Continue with current plan of care       Tri Chittick I. Vear Clock, MS, CCC-SLP Acute Rehabilitation Services Office number [phone] Pager [phone]                Scheryl Marten 09/26/2020, 1:42 PM

## 2020-09-26 NOTE — Progress Notes (Addendum)
Advanced Heart Failure Rounding Note  PCP-Cardiologist: No primary care provider on file.    Patient Profile   55 y/o male w/ no prior cardiac history, admitted for mixed cardiogenic + septic shock in setting of PNA + hepatic encephalopathy.   ECHO LVEF 15% with severe global HK with regional variability + LV clot RV moderately down. Initial Co-ox 48%. Started on DBA.   On CVVHD   Subjective:    Remains on DBA 6.5 mcg/kg/min + NE 15 mcg.   Remains on CVVHD pulling 100 cc/hour.   Planning for PLEX today. Day #3  Complaining of leg pain. Able to eat few bites of apple sauce.    Objective:   Weight Range: 76.9 kg Body mass index is 22.99 kg/m.   Vital Signs:   Temp:  [97.8 F (36.6 C)-99.8 F (37.7 C)] 98.5 F (36.9 C) (04/28 1120) Pulse Rate:  [109-121] 115 (04/28 1103) Resp:  [9-20] 16 (04/28 1103) BP: (115-140)/(73-113) 134/108 (04/28 1103) SpO2:  [94 %-100 %] 100 % (04/28 1103) Weight:  [76.9 kg] 76.9 kg (04/28 0500) Last BM Date: 09/24/20  Weight change: Filed Weights   09/24/20 0430 09/25/20 0500 09/26/20 0500  Weight: 78.5 kg 78.3 kg 76.9 kg    Intake/Output:   Intake/Output Summary (Last 24 hours) at 09/26/2020 1121 Last data filed at 09/26/2020 1100 Gross per 24 hour  Intake 2571.36 ml  Output 4391 ml  Net -1819.64 ml      Physical Exam  CVP 8-9  General:  Weak . No resp difficulty HEENT:+ [place]rak Neck: supple. no JVD. Carotids 2+ bilat; no bruits. No lymphadenopathy or thryomegaly appreciated.LIJ HD cath Cor: PMI nondisplaced. Regular rate & rhythm. No rubs, gallops or murmurs. Lungs: clear Abdomen: soft, nontender, nondistended. No hepatosplenomegaly. No bruits or masses. Good bowel sounds. Extremities: no cyanosis, clubbing, rash, R and LLE 3+ edema Neuro: alert & orientedx3, cranial nerves grossly intact. moves all 4 extremities w/o difficulty. Affect flat.   Telemetry    SinusTach 100-110s   Labs    CBC Recent Labs     09/25/20 0315 09/26/20 0320  WBC 31.6* 32.7*  HGB 8.1* 8.2*  HCT 26.2* 25.7*  MCV 96.3 95.5  PLT 21* 29*   Basic Metabolic Panel Recent Labs    85/02/77 0315 09/25/20 0759 09/25/20 1725 09/26/20 0320  NA 137   < > 137 136  136  K 5.4*   < > 5.1 4.5  4.4  CL 101   < > 97* 99  99  CO2 26   < > 28 27  26   GLUCOSE 152*   < > 157* 146*  146*  BUN 89*   < > 85* 69*  69*  CREATININE 2.43*   < > 2.52* 2.03*  2.00*  CALCIUM 7.4*   < > 7.4* 7.4*  7.4*  MG 2.7*  --   --  2.8*  PHOS 4.4   < > 6.0* 4.6  4.5   < > = values in this interval not displayed.   Liver Function Tests Recent Labs    09/25/20 0315 09/25/20 0759 09/25/20 1725 09/26/20 0320  AST 282*  --   --  271*  ALT 288*  --   --  247*  ALKPHOS 100  --   --  88  BILITOT 1.7*  --   --  1.4*  PROT 5.3*  --   --  5.6*  ALBUMIN 2.8*   < > 2.8* 3.1*  3.0*   < > =  values in this interval not displayed.   No results for input(s): LIPASE, AMYLASE in the last 72 hours. Cardiac Enzymes Recent Labs    09/24/20 0510 09/25/20 0759 09/26/20 0320  CKTOTAL 42,626* 33,136* 25,775*    BNP: BNP (last 3 results) Recent Labs    10-10-2020 2114 09/14/20 0031 09/15/20 0321  BNP 1,447.9* 1,523.7* 743.8*    ProBNP (last 3 results) No results for input(s): PROBNP in the last 8760 hours.   D-Dimer No results for input(s): DDIMER in the last 72 hours. Hemoglobin A1C No results for input(s): HGBA1C in the last 72 hours. Fasting Lipid Panel No results for input(s): CHOL, HDL, LDLCALC, TRIG, CHOLHDL, LDLDIRECT in the last 72 hours. Thyroid Function Tests No results for input(s): TSH, T4TOTAL, T3FREE, THYROIDAB in the last 72 hours.  Invalid input(s): FREET3  Other results:   Imaging    No results found.   Medications:     Scheduled Medications: . calcium gluconate  2 g Intravenous Once  . capsaicin   Topical BID  . Chlorhexidine Gluconate Cloth  6 each Topical Daily  . insulin aspart  0-20 Units  Subcutaneous Q4H  . insulin aspart  4 Units Subcutaneous Q4H  . insulin detemir  15 Units Subcutaneous BID  . lidocaine   Topical BID  . magic mouthwash  10 mL Oral TID  . mouth rinse  15 mL Mouth Rinse BID  . multivitamin with minerals  1 tablet Per Tube Daily  . pantoprazole  40 mg Intravenous Q12H  . sodium chloride flush  10-40 mL Intracatheter Q12H  . thiamine injection  100 mg Intravenous Daily    Infusions: . sodium chloride 10 mL/hr at 09/26/20 1100  . anticoagulant sodium citrate    . calcium gluconate Stopped (09/25/20 1040)  . citrate dextrose Stopped (09/24/20 1900)  . DOBUTamine 6.5 mcg/kg/min (09/26/20 1100)  . feeding supplement (PIVOT 1.5 CAL) 1,000 mL (09/25/20 2329)  . methylPREDNISolone (SOLU-MEDROL) injection Stopped (09/26/20 0719)  . norepinephrine (LEVOPHED) Adult infusion 15 mcg/min (09/26/20 1100)  . prismasol BGK 2/2.5 dialysis solution 1,500 mL/hr at 09/26/20 0827  . prismasol BGK 2/2.5 replacement solution 500 mL/hr at 09/26/20 0828  . prismasol BGK 2/2.5 replacement solution 300 mL/hr at 09/26/20 0446    PRN Medications: sodium chloride, acetaminophen (TYLENOL) oral liquid 160 mg/5 mL, diphenhydrAMINE, docusate, oxyCODONE, sodium chloride flush    Patient Profile   55 y/o male w/ no prior cardiac history, admitted for mixed cardiogenic + septic shock in setting of PNA + hepatic encephalopathy.   ECHO LVEF 15% with severe global HK with regional variability + LV clot RV moderately down. Initial Co-ox 48%. Started on DBA.    Assessment/Plan   1. Cardiogenic shock with multisystem organ failure - ECHO LVEF 15% with severe global HK with regional variability + LV clot RV moderately down. - Remains on DBA 6.5. NE 15 --> CO-OX remains < 40 % -On CVVHD for volume removal  - Unifying diagnosis remains unclear. Serologies unrevealing so far. Await result of muscle biopsy.  - He is actively dying. Suspect underlying myopathic process that has taken away  his cardiac reserve as well - No response to inotropes or steroids. - Continue inotropes for now.    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  - PLTS 21>29    3. Shock liver with hepatic encephalopathy - Suspect due to low output HF. Transaminases improving with hemodynamics support - Liver u/s suggestive of hepatic steatosis but not long-term  cirrhosis. Though low albumin and low PLTs suggestive of more longstanding liver process  - plan as above  4. AKI due to ATN/shock - Baseline creatinine unknown  - SCr 1.5 in 2016 in Care Everywhere - Started CVVHD 4/25.  - Per Nephrology   5. CAP - PCT 3.70 -> 2.9 - BC NGTD  -Afebrile,  WBC 37>31.6 >32.7, on steroids - was on cefepime and ancef. Off antibiotics.    6. Rhabdomyolysis/myositis with hematologic abnormalties - CK 6502>4025> 42>33>25K  - LDH trending down.  - muscle biopsy pending  - suspect underlying inflammatory process but serologies negative so far - No response to steroids. Consider PLEX as above  7. ABLA - likely due to left thigh hematoma +/- GI bleed - Hgb 8.2   8. Thrombocytopenia PLTs trending down 77>59 > 23>21   - HIT + off heparin Hematology following.   9. Leg edema  Vascular Surgery Consulted.  No compartment syndrome  Length of Stay: 13  Amy Clegg, NP  09/26/2020, 11:21 AM  Advanced Heart Failure Team Pager [phone] (M-F; 7a - 5p)  Please contact [facility] for night-coverage after hours (5p -7a ) and weekends on [email or web address]   Agree.   He remains on DBA and NE. Co-ox  40%. Remains very weak. Result of muscle biopsy still pending. Remains on PLEX. Legs sore.   General:  Weak appearing. No resp difficulty HEENT: normal + cor trak Neck: supple. L IJ HD cath. Carotids 2+ bilat; no bruits. No lymphadenopathy or thryomegaly appreciated. Cor: PMI nondisplaced. Regular tachy  +s3 Lungs: clear Abdomen: soft, nontender, nondistended. No hepatosplenomegaly. No bruits or masses. Good  bowel sounds. Extremities: no cyanosis, clubbing, rash, 2+ edema L thigh hematoma Neuro: alert & orientedx3, lethargic diffusely weak  He remains critically ill. Underlying process unclear but appears to be primarily myopathic in nature. Will continue NE and DBA. CVVHD for volume control   Await results of muscle bx. D/w his mother at bedside.   CRITICAL CARE Performed by: Arvilla Meres  Total critical care time: 35 minutes  Critical care time was exclusive of separately billable procedures and treating other patients.  Critical care was necessary to treat or prevent imminent or life-threatening deterioration.  Critical care was time spent personally by me (independent of midlevel providers or residents) on the following activities: development of treatment plan with patient and/or surrogate as well as nursing, discussions with consultants, evaluation of patient's response to treatment, examination of patient, obtaining history from patient or surrogate, ordering and performing treatments and interventions, ordering and review of laboratory studies, ordering and review of radiographic studies, pulse oximetry and re-evaluation of patient's condition.  Arvilla Meres, MD  4:18 PM

## 2020-09-26 NOTE — Progress Notes (Addendum)
ANTICOAGULATION CONSULT NOTE  Pharmacy Consult for Bivalirudin Indication: LV mural thrombus  Heparin Dosing Weight:  77.4 kg  Labs: Recent Labs    09/24/20 0510 09/24/20 0844 09/24/20 1601 09/25/20 0315 09/25/20 0759 09/25/20 1725 09/26/20 0320  HGB 8.7*  --   --  8.1*  --   --  8.2*  HCT 27.6*  --   --  26.2*  --   --  25.7*  PLT 26*  --   --  21*  --   --  29*  HEPARINUNFRC 0.26*  --   --   --   --   --   --   CREATININE 2.89*  --    < > 2.43* 2.24* 2.52* 2.03*  2.00*  CKTOTAL 42,626*  --   --   --  33,136*  --  25,775*  TROPONINIHS  --  1,090*  --   --   --   --   --    < > = values in this interval not displayed.   Assessment: 55 yr old man presented with AMS. Pharmacy was originally consulted for heparin for NSTEMI, then continued for discovered LV thrombus; pt was on no anticoagulants PTA. Pharmacy now consulted for bivalirudin.  MRI brain on 4/20 with multiple acute infarcts and subacute ischemic changes. Per Neurology, will target low end of goal range.  Patient was on IV heparin 4/16 until 4/26 (held intermittently for concerns of bleeding), stopped on 4/26 due to platelets dropping to 21. 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 SRA ordered 4/27, results anticipated 4/30-5/1. Patient is off all heparin.  Upper and lower US obtained, preliminary result without evidence of DVTs. Per Dr. Gaynell Face, re-checking CBC now. If platelets are stable or uptrending from 29 then okay to start bivalirudin.  Goal of Therapy:  Goal aPTT 50-85 (no boluses) -- target lower end of goal ~50-70 given acute infarcts Monitor platelets by anticoagulation protocol: Yes   Plan:  Check CBC now (RN aware, drawing lab now) Start bivalirudin if platelets stable or uptrending - 0.05 mg/kg/hr given CRRT aPTT in 4 hours after bivalirudin started aPTT q12h, daily CBC, monitor renal function, liver function (BMP at least weekly)  Laverna Peace, PharmD PGY-1 Pharmacy  Resident 09/26/2020 3:17 PM Please see AMION for all pharmacy numbers  ADDENDUM Platelets trended down 29 to 24, anticoagulation not started. Follow up platelets in AM to consider starting anticoagulation.  Per Oncology: " If platelet count is above 50,000, we can start him on argatroban if SRA is positive.". Will follow up with CCM to clarify when to start anticoagulation.

## 2020-09-26 NOTE — Progress Notes (Addendum)
IP PROGRESS NOTE  Subjective:   [name] appears unchanged overall.  His mother is at the bedside but feels that he is more alert and talkative today.  She is trying to feed him lunch.  He does not want to be touched due to pain.  Objective: Vital signs in last 24 hours: Blood pressure (!) 134/108, pulse (!) 115, temperature 98.5 F (36.9 C), temperature source Axillary, resp. rate 16, height 6' (1.829 m), weight 76.9 kg, SpO2 100 %.  Intake/Output from previous day: 04/27 0701 - 04/28 0700 In: 2642.3 [I.V.:743.5; NG/GT:1710; IV Piggyback:188.7] Out: 4109 [Urine:3]  Physical Exam:  HEENT: no bleeding, nasal feeding tube in place, no thrush Abdomen: Soft, no hepatosplenomegaly, nontender Extremities: Trace edema at the lower leg bilaterally Musculoskeletal: There is swelling at the left mid and upper thigh Neurologic: Alert, follows simple commands, moves all extremities to command, oriented to place  Skin: No ecchymoses  Right and left IJ catheter sites without evidence of infection or bleeding  Lab Results: Recent Labs    09/25/20 0315 09/26/20 0320  WBC 31.6* 32.7*  HGB 8.1* 8.2*  HCT 26.2* 25.7*  PLT 21* 29*  ADAMTS13 on 09/17/2020 - 38.2% Blood smear 09/24/2020: The platelets are decreased in number, no platelet clumps.  There is a leukocytosis.  The majority the white cells are mature neutrophils.  No blast or other young forms are seen.  Increased nucleated red cells.  The polychromasia is increased.  There are burr cells, target cells, acanthocytes, few ovalocytes, and a few schistocytes. BMET Recent Labs    09/25/20 1725 09/26/20 0320  NA 137 136  136  K 5.1 4.5  4.4  CL 97* 99  99  CO2 28 27  26   GLUCOSE 157* 146*  146*  BUN 85* 69*  69*  CREATININE 2.52* 2.03*  2.00*  CALCIUM 7.4* 7.4*  7.4*   Bilirubin 1.6, LDH 594 No results found for: CEA1  Studies/Results: No results found.  Medications: I have reviewed the patient's current  medications.  Assessment/Plan: 1.  Pulmonary infiltrates 2.  Chronic/acute renal failure 3.  Cardiogenic shock on hospital admission 4.  Liver failure secondary to cardiogenic shock, underlying chronic liver disease? 5.  Anemia 6.  Thrombocytopenia  Daily plasmapheresis 09/24/2020  Solu-Medrol 09/24/2020 7.   Coagulopathy 8.   Altered mental status-hepatic encephalopathy? 9.   Rhabdomyolysis 10.  Oral candidiasis 11.  Muscle weakness-status post a muscle biopsy Oct 07, 2020 12.  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 appears unchanged.  He remains critically ill with multiorgan failure.  The hemoglobin and platelet count have not changed significantly.  He completed a second plasma exchange yesterday.  The plan is to continue daily plasma exchange.  The CK and LDH are partially improved today.  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  SRA pending.  Recommend holding off on argatroban at this time due to thrombocytopenia.  If platelet count is above 50,000, we can start him on argatroban if SRA is positive.  The muscle biopsy is still pending.  We again contacted pathology and they have told Dennis Howard that the pathologist is currently reviewing it.  They have our number to call us with the result.  Recommendations:  1.  Transfuse packed red blood cells as needed 2.  Daily CBC 3.  Follow-up muscle biopsy result.  Called again today and is under review by pathologist.  They have our number to call us. 4.  Steroids as recommended by the critical care and neurology services 5.  CRRT per Nephrology.  6.  Continue trial of daily plasmapheresis 7.  Follow-up on SRA.  Will hold on argatroban at this time due to thrombocytopenia.  May consider starting the patient on argatroban her platelet count above 50,000.    LOS: 13 days  [name] was interviewed and  examined.  He appeared more alert when I saw him early this morning.  His overall status appears unchanged.  He will complete day #3 plasmapheresis today ,given for treatment of potential microangiopathy and myositis.  The CPK and LDH are mildly improved today.  The HIT study from 09/24/2020 returned positive in the setting of a significant fall in the platelet count.  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  I recommend holding anticoagulation therapy until the platelet count is higher.  We attempted to get results on the muscle biopsy again today.  I was present for greater than 50% of today's visit.  I perform medical decision making.  Mancel Bale, MD

## 2020-09-26 NOTE — Progress Notes (Signed)
NAME:  Dennis Caravello., MRN:  [id number], DOB:  Jan 18, 1966, LOS: 13 ADMISSION DATE:  09/02/2020, CONSULTATION DATE: 09/07/2020 REFERRING MD: Wilkie Aye - EM CHIEF COMPLAINT:  Altered Mental Status  History of Present Illness:  55 yo male former smoker presented with altered mental status that progressed for one week prior to admission.  This was associated with lower leg swelling with blisters, weight gain, dyspnea, and cough.  Also had progressive muscle weakness.  Had recent outpt treatment for pneumonia with doxycycline.  Pertinent  Medical History  CKD III - sr cr 2013 1.6, HTN, Bronchitis, THC Use    Significant Hospital Events: Including procedures, antibiotic start and stop dates in addition to other pertinent events   . 4/15 Admitted to ICU. Treated with azithro, ceftriaxone, flagyl, unasyn. Tylenol, acetaminophen, ETOH, influenza, COVID, hepatitis, urine strep antigen, legionella, & UDS negative.  . 4/16 ECHO with global hypokinesis, anteroseptal wall and apex are akinetic, anterior wall severely hypokinetic, very large layered semi-mobile apical thrombus extending from the apex along the anteroseptal wall, LVEF ~15-20%, RV systolic function mild to moderately reduced.  tarted on dobuatmine drip. ANA negative. Blood cultures negative.  . 4/17 Unasyn stopped, cefepime initiated  . 4/18 Dobutamine at , net neg 1.7L, tolerating soft diet, no n/v . 4/19 Dobutamine increased to 7.35mcg . 4/20 MRI Brain >> 3mm acute infarction at the inferior cerebellum on left and 54mm acute infarction at the left parietooccipital junction. Subacute ischemic changes of the cortical subcortical brain. . 4/22 Thigh hematoma post biopsy . 4/23 Dobutamine at 6.26mcg, pt on heparin gtt, PPI gtt. Muscle biopsy pending.  . 4/24 dark colored bowel movement, hemoglobin stable . 4/25 CK 32K, CRRT started . 4/26 PLEX started yesterday.   Interim History / Subjective:  4/28: On CRRT, no acute events overnight. More  alert today asking for po intake  Objective   Blood pressure (!) 134/108, pulse (!) 115, temperature 98.8 F (37.1 C), temperature source Axillary, resp. rate 16, height 6' (1.829 m), weight 76.9 kg, SpO2 100 %. CVP:  [1 mmHg-10 mmHg] 8 mmHg      Intake/Output Summary (Last 24 hours) at 09/26/2020 1114 Last data filed at 09/26/2020 1100 Gross per 24 hour  Intake 2571.36 ml  Output 4391 ml  Net -1819.64 ml   Filed Weights   09/24/20 0430 09/25/20 0500 09/26/20 0500  Weight: 78.5 kg 78.3 kg 76.9 kg    Examination:  General:  Acutely ill appearing male in NAD  Neuro:  Lethargic, will arouse (and respond to questions intermittently)  HEENT:  [place]/AT, No JVD noted, PERRL Cardiovascular:  RRR, no MRG Lungs:  Bibasilar crackles Abdomen:  Soft, non-distended, non-tender Musculoskeletal:  Bilateral lower extremity 4+ pitting with indurated/hardened L lateral thigh.  Distal pulses dopplerable.  Skin:  Intact, MMM   Resolved Hospital Problem list   Encephalopathy  Assessment & Plan:   Diffuse muscle weakness with elevated CPK and aldolase - mother raised questions of poisoning due to recent relationship issues for the patient.  - Pulse steroids day 3 - 4/21 muscle biopsy still pending, wont be back until next week per heme - Empiric / salvage PLEX day 3 today - Tickborne panel sent 4/26 some still pending. E chaffeenis: negative - heavy metals negative - volatiles negative -ck finally downtrending   Acute systolic CHF with cardiogenic shock: LVEF 15% with severe global HK by echo. LV thrombus Hx of HTN, HLD  - continue dobutamine. Levophed started yesterday targeted towards co-ox -co ox 37  today - pressors per heart failure service.  - Heparin infusion stopped with ? Hitt, and heparin ab positive (SRA pending).  -known LV thrombus, will check UE and LE venous dopplers and if positive will resume a/c with bival in setting of liver injury however if negative will await a day to  see if plts rise further (~30K now) and then resume a/c  - hold outpt lipitor, norvasc, lisinopril  Pulmonary infiltrates: completed ABX 4/23 - f/u CXR intermittently - Steroids as above   Anemia, thrombocytopenia:  Hematology following. Not felt likely to be hemolytic process despite elevated LDH. ADAMTS13 38% so too high for TTP,. TAMOF? Meets criteria but no improvement with steroids.  Melanotic stool: Hgb slowly falling. 8.1 today.  - PLEX x5 - Unclear if there is a role for platelet transfusion. Will defer to heme.  - Pulse steroids x 3 days through 4/29 - as above with + hep ab   Steroid induced hyperglycemia - SSI resistant scale with tube feed coverage at 4 units q4h, levemir 15 units bid   AKI on CKD III: due to rhabdo + ischemic ATN secondary to cardiogenic shock. CK peaked at 42K, downtrending to 25K Hypernatremia.  CKD 3B - CRRT ongoing, will hold for PLEX as needed.  - Follow BMP - Appreciate nephrology assistance - Vascular surgery not impressed for compartment syndrome of lower extremities, although pulses have become dopplerable rather than palpable (pt is also on pressors)   Dysphagia.  Moderate protein calorie malnutrition - continue tube feeds  Acute embolic CVA - Neurology has signed off - Will need MRA brain and carotid ultrasound once more stable.  - Call neurology back when muscle biopsy returns   Elevated LFTs. Congestive Hepatopathy -follow LFT trend, stabilized -copper 1,026 but ceruloplasmin 29  Thrush - improved - magic mouth wash prn  Best practice (right click and "Reselect all SmartList Selections" daily)  Diet:  Tube Feed , resume light diet Pain/Anxiety/Delirium protocol (if indicated): No VAP protocol (if indicated): Not indicated DVT prophylaxis: Contraindicated GI prophylaxis: PPI BID Glucose control:  SSI Yes and Basal insulin Yes Central venous access:  Yes, and it is still needed Arterial line:  N/A Foley:  Yes, and it is  still needed,  Mobility:  bed rest  PT consulted: N/A Last date of multidisciplinary goals of care discussion - ongoing Code Status:  full code Disposition: ICU  Labs:   CMP Latest Ref Rng & Units 09/26/2020 09/26/2020 09/25/2020  Glucose 70 - 99 mg/dL 426(S) 341(D) 622(W)  BUN 6 - 20 mg/dL 97(L) 89(Q) 11(H)  Creatinine 0.61 - 1.24 mg/dL 4.17(E) 0.81(K) 4.81(E)  Sodium 135 - 145 mmol/L 136 136 137  Potassium 3.5 - 5.1 mmol/L 4.5 4.4 5.1  Chloride 98 - 111 mmol/L 99 99 97(L)  CO2 22 - 32 mmol/L 27 26 28   Calcium 8.9 - 10.3 mg/dL 7.4(L) 7.4(L) 7.4(L)  Total Protein 6.5 - 8.1 g/dL ) - -  Total Bilirubin 0.3 - 1.2 mg/dL 5.6(D) - -  Alkaline Phos 38 - 126 U/L 88 - -  AST 15 - 41 U/L 271(H) - -  ALT 0 - 44 U/L 247(H) - -    CBC Latest Ref Rng & Units 09/26/2020 09/25/2020 09/24/2020  WBC 4.0 - 10.5 K/uL 32.7(H) 31.6(H) 36.2(H)  Hemoglobin 13.0 - 17.0 g/dL 8.2(L) 8.1(L) 8.7(L)  Hematocrit 39.0 - 52.0 % 25.7(L) 26.2(L) 27.6(L)  Platelets 150 - 400 K/uL 29(LL) 21(LL) 26(LL)    ABG    Component Value  Date/Time   PHART 7.540 (H) 09/19/2020 1650   PCO2ART 27.2 (L) 09/19/2020 1650   PO2ART 113 (H) 09/19/2020 1650   HCO3 23.3 09/19/2020 1650   TCO2 24 09/19/2020 1650   ACIDBASEDEF 1.8 09/14/2020 0031   O2SAT 37.5 09/26/2020 0304    CBG (last 3)  Recent Labs    09/25/20 2333 09/26/20 0315 09/26/20 0726  GLUCAP 111* 129* 140*    Critical care time: The patient is critically ill with multiple organ systems failure and requires high complexity decision making for assessment and support, frequent evaluation and titration of therapies, application of advanced monitoring technologies and extensive interpretation of multiple databases.  Critical care time 38 mins. This represents my time independent of the NPs time taking care of the pt. This is excluding procedures.    Briant Sites DO [facility] 09/26/2020, 11:14 AM See Amion for pager If no response  to pager, please call [phone] until 1900 After 1900 please call [facility] [phone]

## 2020-09-27 ENCOUNTER — Other Ambulatory Visit: Payer: Self-pay

## 2020-09-27 DIAGNOSIS — R918 Other nonspecific abnormal finding of lung field: Secondary | ICD-10-CM | POA: Diagnosis not present

## 2020-09-27 DIAGNOSIS — G9340 Encephalopathy, unspecified: Secondary | ICD-10-CM | POA: Diagnosis not present

## 2020-09-27 DIAGNOSIS — D696 Thrombocytopenia, unspecified: Secondary | ICD-10-CM | POA: Diagnosis not present

## 2020-09-27 DIAGNOSIS — N170 Acute kidney failure with tubular necrosis: Secondary | ICD-10-CM | POA: Diagnosis not present

## 2020-09-27 DIAGNOSIS — D62 Acute posthemorrhagic anemia: Secondary | ICD-10-CM | POA: Diagnosis not present

## 2020-09-27 DIAGNOSIS — D649 Anemia, unspecified: Secondary | ICD-10-CM | POA: Diagnosis not present

## 2020-09-27 DIAGNOSIS — R570 Cardiogenic shock: Secondary | ICD-10-CM | POA: Diagnosis not present

## 2020-09-27 DIAGNOSIS — N189 Chronic kidney disease, unspecified: Secondary | ICD-10-CM | POA: Diagnosis not present

## 2020-09-27 LAB — THERAPEUTIC PLASMA EXCHANGE (BLOOD BANK)
Plasma Exchange: 3970
Plasma volume needed: 3970
Unit division: 0
Unit division: 0
Unit division: 0
Unit division: 0
Unit division: 0
Unit division: 0
Unit division: 0
Unit division: 0
Unit division: 0

## 2020-09-27 LAB — CBC WITH DIFFERENTIAL/PLATELET
Abs Immature Granulocytes: 0.36 10*3/uL — ABNORMAL HIGH (ref 0.00–0.07)
Basophils Absolute: 0 10*3/uL (ref 0.0–0.1)
Basophils Relative: 0 %
Eosinophils Absolute: 0 10*3/uL (ref 0.0–0.5)
Eosinophils Relative: 0 %
HCT: 24 % — ABNORMAL LOW (ref 39.0–52.0)
Hemoglobin: 7.4 g/dL — ABNORMAL LOW (ref 13.0–17.0)
Immature Granulocytes: 1 %
Lymphocytes Relative: 3 %
Lymphs Abs: 0.7 10*3/uL (ref 0.7–4.0)
MCH: 29.8 pg (ref 26.0–34.0)
MCHC: 30.8 g/dL (ref 30.0–36.0)
MCV: 96.8 fL (ref 80.0–100.0)
Monocytes Absolute: 1.3 10*3/uL — ABNORMAL HIGH (ref 0.1–1.0)
Monocytes Relative: 5 %
Neutro Abs: 23.7 10*3/uL — ABNORMAL HIGH (ref 1.7–7.7)
Neutrophils Relative %: 91 %
Platelets: 35 10*3/uL — ABNORMAL LOW (ref 150–400)
RBC: 2.48 MIL/uL — ABNORMAL LOW (ref 4.22–5.81)
RDW: 17.2 % — ABNORMAL HIGH (ref 11.5–15.5)
WBC: 26.1 10*3/uL — ABNORMAL HIGH (ref 4.0–10.5)
nRBC: 1.2 % — ABNORMAL HIGH (ref 0.0–0.2)

## 2020-09-27 LAB — GLUCOSE, CAPILLARY
Glucose-Capillary: 147 mg/dL — ABNORMAL HIGH (ref 70–99)
Glucose-Capillary: 128 mg/dL — ABNORMAL HIGH (ref 70–99)
Glucose-Capillary: 174 mg/dL — ABNORMAL HIGH (ref 70–99)
Glucose-Capillary: 166 mg/dL — ABNORMAL HIGH (ref 70–99)
Glucose-Capillary: 233 mg/dL — ABNORMAL HIGH (ref 70–99)
Glucose-Capillary: 237 mg/dL — ABNORMAL HIGH (ref 70–99)

## 2020-09-27 LAB — RENAL FUNCTION PANEL
Albumin: 2.9 g/dL — ABNORMAL LOW (ref 3.5–5.0)
Albumin: 2.9 g/dL — ABNORMAL LOW (ref 3.5–5.0)
Anion gap: 9 (ref 5–15)
Anion gap: 8 (ref 5–15)
BUN: 65 mg/dL — ABNORMAL HIGH (ref 6–20)
BUN: 62 mg/dL — ABNORMAL HIGH (ref 6–20)
CO2: 28 mmol/L (ref 22–32)
CO2: 27 mmol/L (ref 22–32)
Calcium: 7.4 mg/dL — ABNORMAL LOW (ref 8.9–10.3)
Calcium: 7.2 mg/dL — ABNORMAL LOW (ref 8.9–10.3)
Chloride: 98 mmol/L (ref 98–111)
Chloride: 98 mmol/L (ref 98–111)
Creatinine, Ser: 1.82 mg/dL — ABNORMAL HIGH (ref 0.61–1.24)
Creatinine, Ser: 1.69 mg/dL — ABNORMAL HIGH (ref 0.61–1.24)
GFR, Estimated: 44 mL/min — ABNORMAL LOW (ref >60)
GFR, Estimated: 48 mL/min — ABNORMAL LOW (ref >60)
Glucose, Bld: 147 mg/dL — ABNORMAL HIGH (ref 70–99)
Glucose, Bld: 179 mg/dL — ABNORMAL HIGH (ref 70–99)
Phosphorus: 3.7 mg/dL (ref 2.5–4.6)
Phosphorus: 3.7 mg/dL (ref 2.5–4.6)
Potassium: 4.8 mmol/L (ref 3.5–5.1)
Potassium: 4.5 mmol/L (ref 3.5–5.1)
Sodium: 135 mmol/L (ref 135–145)
Sodium: 133 mmol/L — ABNORMAL LOW (ref 135–145)

## 2020-09-27 LAB — COOXEMETRY PANEL
Carboxyhemoglobin: 1.3 % (ref 0.5–1.5)
Methemoglobin: 1.3 % (ref 0.0–1.5)
O2 Saturation: 44.7 %
Total hemoglobin: 7.5 g/dL — ABNORMAL LOW (ref 12.0–16.0)

## 2020-09-27 LAB — LACTATE DEHYDROGENASE: LDH: 621 U/L — ABNORMAL HIGH (ref 98–192)

## 2020-09-27 LAB — FUNGITELL, SERUM: Fungitell Result: 55 pg/mL (ref <80)

## 2020-09-27 LAB — APTT: aPTT: 41 seconds — ABNORMAL HIGH (ref 24–36)

## 2020-09-27 LAB — MAGNESIUM: Magnesium: 2.6 mg/dL — ABNORMAL HIGH (ref 1.7–2.4)

## 2020-09-27 LAB — CK: Total CK: 14656 U/L — ABNORMAL HIGH (ref 49–397)

## 2020-09-27 MED ORDER — DIPHENHYDRAMINE HCL 25 MG PO CAPS: 25.0000 mg | ORAL_CAPSULE | Freq: Four times a day (QID) | ORAL | Status: DC | PRN

## 2020-09-27 MED ORDER — SODIUM CHLORIDE 0.9 % IV SOLN
0.1100 mg/kg/h | INTRAVENOUS | Status: DC
  Administered 2020-09-27: 0.05 mg/kg/h via INTRAVENOUS
  Administered 2020-09-29: 0.1 mg/kg/h via INTRAVENOUS
  Administered 2020-09-30: 0.13 mg/kg/h via INTRAVENOUS
  Administered 2020-10-01: 0.15 mg/kg/h via INTRAVENOUS
  Filled 2020-09-27 (×7): qty 250

## 2020-09-27 MED ORDER — CALCIUM GLUCONATE 10 % IV SOLN
2.0000 g | Freq: Once | INTRAVENOUS | Status: DC
  Filled 2020-09-27: qty 20

## 2020-09-27 MED ORDER — CALCIUM GLUCONATE-NACL 2-0.675 GM/100ML-% IV SOLN
2.0000 g | Freq: Once | INTRAVENOUS | Status: AC
  Filled 2020-09-27: qty 100

## 2020-09-27 MED ORDER — ACETAMINOPHEN 325 MG PO TABS: 650.0000 mg | ORAL_TABLET | ORAL | Status: DC | PRN

## 2020-09-27 MED ORDER — ANTICOAGULANT SODIUM CITRATE 4% (200MG/5ML) IV SOLN
5.0000 mL | Freq: Once | Status: DC
  Filled 2020-09-27: qty 5

## 2020-09-27 MED ORDER — CALCIUM GLUCONATE-NACL 2-0.675 GM/100ML-% IV SOLN
INTRAVENOUS | Status: AC
  Administered 2020-09-27: 2000 mg via INTRAVENOUS
  Filled 2020-09-27: qty 100

## 2020-09-27 MED ORDER — ACD FORMULA A 0.73-2.45-2.2 GM/100ML VI SOLN
1000.0000 mL | Status: DC
  Filled 2020-09-27: qty 1000

## 2020-09-27 MED ORDER — OXYCODONE HCL 5 MG PO TABS
5.0000 mg | ORAL_TABLET | ORAL | Status: DC | PRN
  Administered 2020-09-27 – 2020-10-01 (×10): 5 mg
  Filled 2020-09-27 (×10): qty 1

## 2020-09-27 MED ORDER — ACD FORMULA A 0.73-2.45-2.2 GM/100ML VI SOLN
Status: AC
  Filled 2020-09-27: qty 500

## 2020-09-27 NOTE — Progress Notes (Signed)
Occupational Therapy Treatment Patient Details Name: Dennis Howard. MRN: [id number] DOB: 1965/11/30 Today's Date: 09/27/2020    History of present illness 55 y/o man who presented to [facility] on 4/15 with  AMS, cardiogenic + septic shock in setting of PNA + hepatic encephalopathy. Also with LV thrombus and NSTEMI.    MRI showing 30mm acute infarction at the inferior cerebellum on left and 21mm acute infarction at the left parietooccipital junction. Subacute ischemic changes of the cortical subcortical brain.  He has associated lower extremity swelling, weight gain, shortness of breath, and cough. Pending workup for neuromuscular disorder. S/P L quadriceps biopsy 4/20 resulted in left thigh hematoma. 4/25 back on pressors and CRRT initiated. PMH: chronic kidney disease, hypertension, Marijuana daily for 20 + years   OT comments  Pt more alert and verbalizing some today. Laughed when asked if we should call him Mr. Bowdish and when told a joke. Requested to eat chocolate and self fed ice cream with moderate assistance. Pt able to sit EOB x 10 min without physical assist, but with head down primarily. Following commands requires repetition and increased time and remains inconsistent. Pt remains on CRRT and 3L 02. Noted to have stable VS throughout session with exception of one brief HR elevation to 132 while eating.   Follow Up Recommendations  CIR;Supervision/Assistance - 24 hour    Equipment Recommendations   (TBD)    Recommendations for Other Services      Precautions / Restrictions Precautions Precautions: Fall Precaution Comments: CRRT, edematous LEs with painful feet, cortrak       Mobility Bed Mobility Overal bed mobility: Needs Assistance Bed Mobility: Supine to Sit;Sit to Supine     Supine to sit: +2 for safety/equipment;Total assist Sit to supine: +2 for safety/equipment;Total assist   General bed mobility comments: assist for all aspects, used towel under B LEs to minimize pain  while moving them on/off bed, +2 total to pull up in bed    Transfers                 General transfer comment: deferred    Balance Overall balance assessment: Needs assistance Sitting-balance support: Feet supported Sitting balance-Leahy Scale: Fair Sitting balance - Comments: sat EOB x 10  minutes with supervision to min guard assist and cues/physical assist to raise head/maintain in midline, facilitated scapular adduction and depression                                   ADL either performed or assessed with clinical judgement   ADL Overall ADL's : Needs assistance/impaired Eating/Feeding: Bed level;Moderate assistance (HOB up) Eating/Feeding Details (indicate cue type and reason): assist to scoop and place untensil in pt's R hand, pt able to bring to his mouth Grooming: Set up;Bed level Grooming Details (indicate cue type and reason): wiped his mouth                                     Vision       Perception     Praxis      Cognition Arousal/Alertness: Lethargic Behavior During Therapy: Flat affect (smiled at end of session when told a joke) Overall Cognitive Status: Impaired/Different from baseline Area of Impairment: Attention;Memory;Following commands;Problem solving;Orientation                 Orientation Level:  Time;Situation Current Attention Level: Sustained Memory: Decreased recall of precautions;Decreased short-term memory Following Commands: Follows one step commands inconsistently;Follows one step commands with increased time   Awareness: Intellectual Problem Solving: Slow processing;Decreased initiation;Difficulty sequencing;Requires verbal cues;Requires tactile cues General Comments: Verbalizing, but minimally. Stating he wanted to eat chocolate and asked to be covered up at the end of the session. Followed commands of "shake my hand" by the dr and "feed yourself."        Exercises     Shoulder  Instructions       General Comments      Pertinent Vitals/ Pain       Pain Assessment: Faces Faces Pain Scale: Hurts even more Pain Location: B feet Pain Descriptors / Indicators: Grimacing;Guarding Pain Intervention(s): Monitored during session;Repositioned;Premedicated before session  Home Living                                          Prior Functioning/Environment              Frequency  Min 2X/week        Progress Toward Goals  OT Goals(current goals can now be found in the care plan section)  Progress towards OT goals: Progressing toward goals  Acute Rehab OT Goals Patient Stated Goal: none stated OT Goal Formulation: Patient unable to participate in goal setting Time For Goal Achievement: 10/03/20 Potential to Achieve Goals: Fair  Plan Discharge plan remains appropriate    Co-evaluation    PT/OT/SLP Co-Evaluation/Treatment: Yes Reason for Co-Treatment: Complexity of the patient's impairments (multi-system involvement);For patient/therapist safety   OT goals addressed during session: ADL's and self-care;Strengthening/ROM      AM-PAC OT "6 Clicks" Daily Activity     Outcome Measure   Help from another person eating meals?: A Lot Help from another person taking care of personal grooming?: A Lot Help from another person toileting, which includes using toliet, bedpan, or urinal?: Total Help from another person bathing (including washing, rinsing, drying)?: Total Help from another person to put on and taking off regular upper body clothing?: Total Help from another person to put on and taking off regular lower body clothing?: Total 6 Click Score: 8    End of Session Equipment Utilized During Treatment: Oxygen (3L)  OT Visit Diagnosis: Other abnormalities of gait and mobility (R26.89);Muscle weakness (generalized) (M62.81);Other symptoms and signs involving the nervous system (R29.898);Other symptoms and signs involving cognitive  function   Activity Tolerance Patient tolerated treatment well   Patient Left in bed;with call bell/phone within reach;with bed alarm set   Nurse Communication Other (comment) (progress, IV alarming)        Time: 7673-4193 OT Time Calculation (min): 37 min  Charges: OT General Charges $OT Visit: 1 Visit OT Treatments $Self Care/Home Management : 8-22 mins   Evern Bio 09/27/2020, 1:08 PM Martie Round, OTR/L Acute Rehabilitation Services Pager: [phone] Office: [phone]

## 2020-09-27 NOTE — Progress Notes (Addendum)
NAME:  Dennis Howard., MRN:  [id number], DOB:  06-25-1965, LOS: 14 ADMISSION DATE:  09/04/2020, CONSULTATION DATE: 09/19/2020 REFERRING MD: Wilkie Aye - EM CHIEF COMPLAINT:  Altered Mental Status  History of Present Illness:  55 yo male former smoker presented with altered mental status that progressed for one week prior to admission.  This was associated with lower leg swelling with blisters, weight gain, dyspnea, and cough.  Also had progressive muscle weakness.  Had recent outpt treatment for pneumonia with doxycycline.  Pertinent  Medical History  CKD III - sr cr 2013 1.6, HTN, Bronchitis, THC Use    Significant Hospital Events: Including procedures, antibiotic start and stop dates in addition to other pertinent events   . 4/15 Admitted to ICU. Treated with azithro, ceftriaxone, flagyl, unasyn. Tylenol, acetaminophen, ETOH, influenza, COVID, hepatitis, urine strep antigen, legionella, & UDS negative.  . 4/16 ECHO with global hypokinesis, anteroseptal wall and apex are akinetic, anterior wall severely hypokinetic, very large layered semi-mobile apical thrombus extending from the apex along the anteroseptal wall, LVEF ~15-20%, RV systolic function mild to moderately reduced.  tarted on dobuatmine drip. ANA negative. Blood cultures negative.  . 4/17 Unasyn stopped, cefepime initiated  . 4/18 Dobutamine at , net neg 1.7L, tolerating soft diet, no n/v . 4/19 Dobutamine increased to 7.85mcg . 4/20 MRI Brain >> 31mm acute infarction at the inferior cerebellum on left and 70mm acute infarction at the left parietooccipital junction. Subacute ischemic changes of the cortical subcortical brain. . 4/22 Thigh hematoma post biopsy . 4/23 Dobutamine at 6.82mcg, pt on heparin gtt, PPI gtt. Muscle biopsy pending.  . 4/24 dark colored bowel movement, hemoglobin stable . 4/25 CK 32K, CRRT started . 4/26 PLEX started yesterday.   Interim History / Subjective:  4/28: On CRRT, no acute events overnight. More  alert today asking for po intake 4/29: Sitting at side of bed, interactive, answering questions with one-word   Objective   Blood pressure (!) 137/97, pulse (!) 114, temperature 98 F (36.7 C), temperature source Axillary, resp. rate 10, height 6' (1.829 m), weight 74.8 kg, SpO2 100 %. CVP:  [5 mmHg-11 mmHg] 7 mmHg      Intake/Output Summary (Last 24 hours) at 09/27/2020 1024 Last data filed at 09/27/2020 1000 Gross per 24 hour  Intake 2589.63 ml  Output 4157 ml  Net -1567.37 ml   Filed Weights   09/25/20 0500 09/26/20 0500 09/27/20 0500  Weight: 78.3 kg 76.9 kg 74.8 kg    Examination:  General: Acutely ill-appearing  neuro: Awake and interactive HEENT: Moist oral mucosa Cardiovascular:  RRR, no MRG Lungs: Rales bibasilarly Abdomen: Soft, nontender Musculoskeletal: Bilateral lower extremity edema  skin:  Intact, MMM   Resolved Hospital Problem list   Encephalopathy  Assessment & Plan:   Diffuse muscle weakness with elevated CPK and aldolase -CK downtrending -Heavy metals negative, volatiles negative, -On pulse dose steroids -Muscle biopsy pending results -On Plex -CRRT  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 Has a left ventricular thrombus -Will start bivalirudin today  Acute systolic congestive heart failure with cardiogenic shock Left ventricle ejection fraction 15% with global hypokinesis On Levophed and dobutamine -Continue to monitor Choloxin -Hold antihypertensives, hold lisinopril, hold Lipitor -Appreciate heart failure service  Anemia, thrombocytopenia -No improvement with steroids -Pulse dose steroids to complete today -On PLE X -Hematocrit down but stable  Steroid-induced hyperglycemia -Continue SSI -On Levemir 15 units twice daily  Dysphagia Moderate protein calorie malnutrition -Continue tube feeds -Tolerating orally, mechanical soft  Acute embolic CVA -Will  need MRA when more stable -Pending muscle biopsy results, reinvolve neurology  Acute kidney  injury on chronic kidney disease Rhabdomyolysis -Continue CRRT  Completed antibiotics for pulmonary infiltrates  Elevated LFTs. Congestive Hepatopathy -follow LFT trend, stabilized -copper 1,026 but ceruloplasmin 29  Thrush - improved - magic mouth wash prn  Doing much better with physical therapy Was able to sit at the side of the bed Was able to raise his head  Best practice (right click and "Reselect all SmartList Selections" daily)  Diet:  Tube Feed , Mechanical soft diet  pain/Anxiety/Delirium protocol (if indicated): No VAP protocol (if indicated): Not indicated DVT prophylaxis: Contraindicated GI prophylaxis: PPI BID Glucose control:  SSI Yes and Basal insulin Yes Central venous access:  Yes, and it is still needed Arterial line:  N/A Foley:  Yes, and it is still needed,  Mobility:  bed rest  PT consulted: N/A Last date of multidisciplinary goals of care discussion - ongoing Code Status:  full code Disposition: ICU  Labs:   CMP Latest Ref Rng & Units 09/27/2020 09/26/2020 09/26/2020  Glucose 70 - 99 mg/dL 270(J) 500(X) 381(W)  BUN 6 - 20 mg/dL 29(H) 37(J) 69(C)  Creatinine 0.61 - 1.24 mg/dL 7.89(F) 8.10(F) 7.51(W)  Sodium 135 - 145 mmol/L 135 134(L) 136  Potassium 3.5 - 5.1 mmol/L 4.8 4.8 4.5  Chloride 98 - 111 mmol/L 98 98 99  CO2 22 - 32 mmol/L 28 25 27   Calcium 8.9 - 10.3 mg/dL 7.4(L) 6.9(L) 7.4(L)  Total Protein 6.5 - 8.1 g/dL - - 5.6(L)  Total Bilirubin 0.3 - 1.2 mg/dL - - 1.4(H)  Alkaline Phos 38 - 126 U/L - - 88  AST 15 - 41 U/L - - 271(H)  ALT 0 - 44 U/L - - 247(H)    CBC Latest Ref Rng & Units 09/27/2020 09/26/2020 09/26/2020  WBC 4.0 - 10.5 K/uL 26.1(H) 28.2(H) 32.7(H)  Hemoglobin 13.0 - 17.0 g/dL 7.4(L) 7.6(L) 8.2(L)  Hematocrit 39.0 - 52.0 % 24.0(L) 24.6(L) 25.7(L)  Platelets 150 - 400 K/uL 35(L) 24(LL) 29(LL)    ABG    Component Value Date/Time   PHART 7.540 (H) 09/19/2020 1650   PCO2ART 27.2 (L) 09/19/2020 1650   PO2ART 113 (H) 09/19/2020  1650   HCO3 23.3 09/19/2020 1650   TCO2 24 09/19/2020 1650   ACIDBASEDEF 1.8 09/14/2020 0031   O2SAT 44.7 09/27/2020 0300    CBG (last 3)  Recent Labs    09/26/20 2308 09/27/20 0308 09/27/20 0722  GLUCAP 155* 147* 128*   The patient is critically ill with multiple organ systems failure and requires high complexity decision making for assessment and support, frequent evaluation and titration of therapies, application of advanced monitoring technologies and extensive interpretation of multiple databases. Critical Care Time devoted to patient care services described in this note independent of APP/resident time (if applicable)  is 40 minutes.   09/29/20 MD [facility] Personal pager: [phone] If unanswered, please page CCM On-call: #[phone]

## 2020-09-27 NOTE — Plan of Care (Signed)
  Problem: Clinical Measurements: Goal: Diagnostic test results will improve Outcome: Progressing Goal: Respiratory complications will improve Outcome: Progressing   Problem: Activity: Goal: Risk for activity intolerance will decrease Outcome: Progressing Note: Although patient remains extremely weak, he continues to actively participate in PT/OT.   Problem: Nutrition: Goal: Adequate nutrition will be maintained Outcome: Progressing Note: Pt is tolerating tube feeds well at goal. Also is on a soft diet and able to take some food PO.   Problem: Elimination: Goal: Will not experience complications related to urinary retention Outcome: Progressing Note: Although patient is not retaining urine, he also is not making any urine.   Problem: Safety: Goal: Ability to remain free from injury will improve Outcome: Progressing

## 2020-09-27 NOTE — Progress Notes (Signed)
Physical Therapy Treatment Patient Details Name: Dennis Howard. MRN: [id number] DOB: 1965-09-24 Today's Date: 09/27/2020    History of Present Illness 55 y/o man who presented to [facility] on 4/15 with  AMS, cardiogenic + septic shock in setting of PNA + hepatic encephalopathy. Also with LV thrombus and NSTEMI.    MRI showing 52mm acute infarction at the inferior cerebellum on left and 40mm acute infarction at the left parietooccipital junction. Subacute ischemic changes of the cortical subcortical brain.  He has associated lower extremity swelling, weight gain, shortness of breath, and cough. Pending workup for neuromuscular disorder. S/P L quadriceps biopsy 4/20 resulted in left thigh hematoma. 4/25 back on pressors and CRRT initiated. PMH: chronic kidney disease, hypertension, Marijuana daily for 20 + years    PT Comments    Pt admitted with above diagnosis. Pt was able to sit EOB x 10 min with min guard assist for most of the time with pt attempting to hold his head up when asked.  Pt answering yes no questions with delayed response as well.  Pt appears more interactive today.  Pt is still on CRRT but does tolerate therapy.   Pt currently with functional limitations due to balance and endurance deficits. Pt will benefit from skilled PT to increase their independence and safety with mobility to allow discharge to the venue listed below.     Follow Up Recommendations  LTACH     Equipment Recommendations  Rolling walker with 5" wheels;3in1 (PT)    Recommendations for Other Services       Precautions / Restrictions Precautions Precautions: Fall Precaution Comments: CRRT, edematous LEs with painful feet, cortrak Restrictions Weight Bearing Restrictions: No    Mobility  Bed Mobility Overal bed mobility: Needs Assistance Bed Mobility: Supine to Sit;Sit to Supine     Supine to sit: +2 for safety/equipment;Total assist Sit to supine: +2 for safety/equipment;Total assist   General bed  mobility comments: assist for all aspects, used towel under B LEs to minimize pain while moving them on/off bed, +2 total to pull up in bed    Transfers                 General transfer comment: deferred  Ambulation/Gait                 Stairs             Wheelchair Mobility    Modified Rankin (Stroke Patients Only) Modified Rankin (Stroke Patients Only) Pre-Morbid Rankin Score: No symptoms Modified Rankin: Severe disability     Balance Overall balance assessment: Needs assistance Sitting-balance support: Feet supported Sitting balance-Leahy Scale: Fair Sitting balance - Comments: sat EOB x 10  minutes with supervision to min guard assist and cues/physical assist to raise head/maintain in midline, facilitated scapular adduction and depression.  Performed a few UE and LE exercises while sitting as well.                                    Cognition Arousal/Alertness: Lethargic Behavior During Therapy: Flat affect (smiled at end of session when told a joke) Overall Cognitive Status: Impaired/Different from baseline Area of Impairment: Attention;Memory;Following commands;Problem solving;Orientation                 Orientation Level: Time;Situation Current Attention Level: Sustained Memory: Decreased recall of precautions;Decreased short-term memory Following Commands: Follows one step commands inconsistently;Follows one step commands with increased  time   Awareness: Intellectual Problem Solving: Slow processing;Decreased initiation;Difficulty sequencing;Requires verbal cues;Requires tactile cues General Comments: Verbalizing, but minimally. Stating he wanted to eat chocolate and asked to be covered up at the end of the session. Followed commands of "shake my hand" by the dr and "feed yourself."      Exercises General Exercises - Upper Extremity Shoulder Flexion: AAROM;Both;10 reps;Supine Shoulder ABduction: AAROM;Right;5 reps Elbow  Flexion: Both;AAROM;5 reps;Seated;10 reps Elbow Extension: Both;Supine;AAROM;5 reps General Exercises - Lower Extremity Ankle Circles/Pumps: Both;Supine;PROM    General Comments General comments (skin integrity, edema, etc.): VSS      Pertinent Vitals/Pain Pain Assessment: Faces Faces Pain Scale: Hurts even more Pain Location: B feet Pain Descriptors / Indicators: Grimacing;Guarding Pain Intervention(s): Limited activity within patient's tolerance;Monitored during session;Repositioned    Home Living                      Prior Function            PT Goals (current goals can now be found in the care plan section) Acute Rehab PT Goals Patient Stated Goal: none stated Progress towards PT goals: Progressing toward goals    Frequency    Min 3X/week      PT Plan Current plan remains appropriate    Co-evaluation PT/OT/SLP Co-Evaluation/Treatment: Yes Reason for Co-Treatment: Complexity of the patient's impairments (multi-system involvement);For patient/therapist safety PT goals addressed during session: Mobility/safety with mobility OT goals addressed during session: ADL's and self-care;Strengthening/ROM      AM-PAC PT "6 Clicks" Mobility   Outcome Measure  Help needed turning from your back to your side while in a flat bed without using bedrails?: Total Help needed moving from lying on your back to sitting on the side of a flat bed without using bedrails?: Total Help needed moving to and from a bed to a chair (including a wheelchair)?: Total Help needed standing up from a chair using your arms (e.g., wheelchair or bedside chair)?: Total Help needed to walk in hospital room?: Total Help needed climbing 3-5 steps with a railing? : Total 6 Click Score: 6    End of Session Equipment Utilized During Treatment: Oxygen Activity Tolerance: Patient limited by fatigue Patient left: in bed;with call bell/phone within reach;with bed alarm set (bed in chair  position) Nurse Communication: Mobility status PT Visit Diagnosis: Unsteadiness on feet (R26.81);Muscle weakness (generalized) (M62.81)     Time: 6010-9323 PT Time Calculation (min) (ACUTE ONLY): 29 min  Charges:  $Therapeutic Activity: 8-22 mins                     Dennis Howard,PT Acute Rehab Services [phone] (pager)   Dennis Howard 09/27/2020, 1:47 PM

## 2020-09-27 NOTE — Progress Notes (Signed)
ANTICOAGULATION CONSULT NOTE  Pharmacy Consult for Bivalirudin Indication: LV mural thrombus  Heparin Dosing Weight:  77.4 kg  Labs: Recent Labs    09/25/20 0759 09/25/20 1725 09/26/20 0320 09/26/20 1518 09/26/20 1551 09/27/20 0301  HGB  --   --  8.2* 7.6*  --  7.4*  HCT  --   --  25.7* 24.6*  --  24.0*  PLT  --   --  29* 24*  --  35*  CREATININE 2.24*   < > 2.03*  2.00*  --  2.01* 1.82*  CKTOTAL 33,136*  --  25,775*  --   --  14,656*   < > = values in this interval not displayed.   Assessment: 55 yr old man presented with AMS. Pharmacy was originally consulted for heparin for NSTEMI, then continued for discovered LV thrombus; pt was on no anticoagulants PTA. Pharmacy now consulted for bivalirudin.  MRI brain on 4/20 with multiple acute infarcts and subacute ischemic changes. Per Neurology, will target low end of goal range.  Patient was on IV heparin 4/16 until 4/26 (held intermittently for concerns of bleeding), stopped on 4/26 due to platelets dropping to 21. 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 SRA ordered 4/27, results anticipated 4/30-5/1 (although results may be delayed due to weekend?). Patient is off all heparin.  Upper and lower US obtained, preliminary result without evidence of DVTs. Platelets improved today to 35. No s/sx bleeding reported. Given significant LV thrombus and higher risk for thrombosis with possible HIT, per CCM will start anticoagulation now. No specific platelet cut off to stop anticoagulation but will monitor closely. Prefer bivalirudin over argatroban due to liver dysfunction.  Patient also on CRRT, pausing for ~4 hours when completing plasmapheresis daily, last plasmaphoresis 4/28 at 1500.  Goal of Therapy:  Goal aPTT 50-85 (no boluses) -- target lower end of goal ~50-70 given acute infarcts Monitor platelets by anticoagulation protocol: Yes   Plan:  Start bivalirudin 0.05 mg/kg/hr (on CRRT) aPTT in 4 hours aPTT q12h,  daily CBC, monitor renal function/CRRT, liver function (BMP at least weekly) Monitor weight for significant changes to adjust infusion rate  Laverna Peace, PharmD PGY-1 Pharmacy Resident 09/27/2020 11:27 AM Please see AMION for all pharmacy numbers

## 2020-09-27 NOTE — Progress Notes (Addendum)
IP PROGRESS NOTE  Subjective:   [name] appears more alert.  He is able to tell me that his arms hurt.  No bleeding reported.  I have received a voicemail regarding her preliminary biopsy result from his muscle biopsy.  This showed focal inflammation which was not abundant, some minor changes of myositis, but not conclusive.  Final pathology is still pending.  Objective: Vital signs in last 24 hours: Blood pressure (!) 132/94, pulse (!) 113, temperature 97.8 F (36.6 C), temperature source Axillary, resp. rate 10, height 6' (1.829 m), weight 74.8 kg, SpO2 100 %.  Intake/Output from previous day: 04/28 0701 - 04/29 0700 In: 2574.6 [I.V.:730.8; NG/GT:1560; IV Piggyback:283.8] Out: 4326 [Urine:4]  Physical Exam:  HEENT: no bleeding, nasal feeding tube in place, no thrush Abdomen: Soft, no hepatosplenomegaly, nontender Extremities: Trace edema at the lower leg bilaterally Musculoskeletal: There is swelling at the left mid and upper thigh Neurologic: Alert, follows simple commands, moves all extremities to command, oriented to place  Skin: No ecchymoses  Right and left IJ catheter sites without evidence of infection or bleeding  Lab Results: Recent Labs    09/26/20 1518 09/27/20 0301  WBC 28.2* 26.1*  HGB 7.6* 7.4*  HCT 24.6* 24.0*  PLT 24* 35*  ADAMTS13 on 09/17/2020 - 38.2% Blood smear 09/24/2020: The platelets are decreased in number, no platelet clumps.  There is a leukocytosis.  The majority the white cells are mature neutrophils.  No blast or other young forms are seen.  Increased nucleated red cells.  The polychromasia is increased.  There are burr cells, target cells, acanthocytes, few ovalocytes, and a few schistocytes. BMET Recent Labs    09/26/20 1551 09/27/20 0301  NA 134* 135  K 4.8 4.8  CL 98 98  CO2 25 28  GLUCOSE 213* 147*  BUN 71* 65*  CREATININE 2.01* 1.82*  CALCIUM 6.9* 7.4*   Bilirubin 1.6, LDH 594 No results found for:  CEA1  Studies/Results: VAS [place] LOWER EXTREMITY VENOUS (DVT)  Result Date: 09/26/2020  Lower Venous DVT Study Patient Name:  Dennis Howard.  Date of Exam:   09/26/2020 Medical Rec #: [id number]         Accession #:    [id number] Date of Birth: 03-15-1966        Patient Gender: M Patient Age:   54Y Exam Location:  [facility] Procedure:      VAS [place] LOWER EXTREMITY VENOUS (DVT) Referring Phys: [id number] JESSICA MARSHALL --------------------------------------------------------------------------------  Indications: Edema. Other Indications: Cardiogenic and septic shock. Comparison Study: No prior Performing Technologist: Marilynne Halsted RDMS, RVT  Examination Guidelines: A complete evaluation includes B-mode imaging, spectral Doppler, color Doppler, and power Doppler as needed of all accessible portions of each vessel. Bilateral testing is considered an integral part of a complete examination. Limited examinations for reoccurring indications may be performed as noted. The reflux portion of the exam is performed with the patient in reverse Trendelenburg.  +---------+---------------+---------+-----------+----------+-------------------+ RIGHT    CompressibilityPhasicitySpontaneityPropertiesThrombus Aging      +---------+---------------+---------+-----------+----------+-------------------+ CFV      Full           Yes      Yes                                      +---------+---------------+---------+-----------+----------+-------------------+ SFJ      Full                                                             +---------+---------------+---------+-----------+----------+-------------------+  FV Prox  Full                                                             +---------+---------------+---------+-----------+----------+-------------------+ FV Mid   Full                                                              +---------+---------------+---------+-----------+----------+-------------------+ FV DistalFull                                                             +---------+---------------+---------+-----------+----------+-------------------+ PFV      Full                                                             +---------+---------------+---------+-----------+----------+-------------------+ POP      Full           Yes      Yes                                      +---------+---------------+---------+-----------+----------+-------------------+ PTV      Full                                                             +---------+---------------+---------+-----------+----------+-------------------+ PERO                                                  Not well visualized +---------+---------------+---------+-----------+----------+-------------------+   +---------+---------------+---------+-----------+----------+-------------------+ LEFT     CompressibilityPhasicitySpontaneityPropertiesThrombus Aging      +---------+---------------+---------+-----------+----------+-------------------+ CFV      Full           Yes      Yes                                      +---------+---------------+---------+-----------+----------+-------------------+ SFJ      Full                                                             +---------+---------------+---------+-----------+----------+-------------------+ FV Prox  Full                                                             +---------+---------------+---------+-----------+----------+-------------------+  FV Mid   Full                                                             +---------+---------------+---------+-----------+----------+-------------------+ FV DistalFull                                                             +---------+---------------+---------+-----------+----------+-------------------+  PFV      Full                                                             +---------+---------------+---------+-----------+----------+-------------------+ POP      Full                                                             +---------+---------------+---------+-----------+----------+-------------------+ PTV      Full                                                             +---------+---------------+---------+-----------+----------+-------------------+ PERO                                                  Not well visualized +---------+---------------+---------+-----------+----------+-------------------+     Summary: RIGHT: - There is no evidence of deep vein thrombosis in the lower extremity. However, portions of this examination were limited- see technologist comments above.  LEFT: - There is no evidence of deep vein thrombosis in the lower extremity. However, portions of this examination were limited- see technologist comments above.  *See table(s) above for measurements and observations.    Preliminary    VAS [place] UPPER EXTREMITY VENOUS DUPLEX  Result Date: 09/26/2020 UPPER VENOUS STUDY  Patient Name:  Dennis Howard.  Date of Exam:   09/26/2020 Medical Rec #: [id number]         Accession #:    [id number] Date of Birth: Aug 02, 1965        Patient Gender: M Patient Age:   54Y Exam Location:  [facility] Procedure:      VAS [place] UPPER EXTREMITY VENOUS DUPLEX Referring Phys: [id number] JESSICA MARSHALL --------------------------------------------------------------------------------  Indications: Edema Other Indications: Cardiogenic and septic shock. Limitations: Bandages. Performing Technologist: Marilynne Halsted RDMS, RVT  Examination Guidelines: A complete evaluation includes B-mode imaging, spectral Doppler, color Doppler, and power Doppler as needed of all accessible portions of each vessel. Bilateral testing is considered an integral part of a complete examination.  Limited examinations for reoccurring indications may be performed as noted.  Right Findings: +----------+------------+---------+-----------+----------+--------------------+ RIGHT     CompressiblePhasicitySpontaneousProperties      Summary        +----------+------------+---------+-----------+----------+--------------------+ IJV                                                  tape & bandage in                                                              place         +----------+------------+---------+-----------+----------+--------------------+ Subclavian               Yes       Yes                                   +----------+------------+---------+-----------+----------+--------------------+ Axillary      Full       Yes       Yes                                   +----------+------------+---------+-----------+----------+--------------------+ Brachial      Full                                                       +----------+------------+---------+-----------+----------+--------------------+ Radial        Full                                       prox only       +----------+------------+---------+-----------+----------+--------------------+ Ulnar         Full                                       prox only       +----------+------------+---------+-----------+----------+--------------------+ Cephalic      Full                                                       +----------+------------+---------+-----------+----------+--------------------+ Basilic       Full                                                       +----------+------------+---------+-----------+----------+--------------------+ Right neck line in place - IJV not visualized. Right forearm segments visualized only.  Left Findings: +----------+------------+---------+-----------+----------+--------------+ LEFT      CompressiblePhasicitySpontaneousProperties  Summary      +----------+------------+---------+-----------+----------+--------------+ IJV                                                 Not visualized +----------+------------+---------+-----------+----------+--------------+ Subclavian    Full       Yes       Yes                             +----------+------------+---------+-----------+----------+--------------+ Axillary      Full       Yes       Yes                             +----------+------------+---------+-----------+----------+--------------+ Brachial    Partial                                                +----------+------------+---------+-----------+----------+--------------+ Radial        Full                                                 +----------+------------+---------+-----------+----------+--------------+ Ulnar         Full                                                 +----------+------------+---------+-----------+----------+--------------+ Cephalic                                            Not visualized +----------+------------+---------+-----------+----------+--------------+ Basilic       Full                                                 +----------+------------+---------+-----------+----------+--------------+  Summary: No evidence of deep vein or superficial vein thrombosis involving the right and left upper extremities in the veins that were visualized. See tech's note.  *See table(s) above for measurements and observations.     Preliminary     Medications: I have reviewed the patient's current medications.  Assessment/Plan: 1.  Pulmonary infiltrates 2.  Chronic/acute renal failure 3.  Cardiogenic shock on hospital admission 4.  Liver failure secondary to cardiogenic shock, underlying chronic liver disease? 5.  Anemia 6.  Thrombocytopenia  Daily plasmapheresis 09/24/2020  Solu-Medrol 09/24/2020 7.   Coagulopathy 8.   Altered mental status-hepatic encephalopathy? 9.    Rhabdomyolysis 10.  Oral candidiasis 11.  Muscle weakness-status post a muscle biopsy 08/31/2020 12.  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  He remains critically ill with multiorgan failure.  The hemoglobin  and platelet count have not changed significantly, but his platelets are starting to trend upward.  It is unclear if his platelets are responding to plasmapheresis.  He completed a third plasma exchange yesterday.  The plan is to continue daily plasma exchange x5 days.  The CK and LDH are partially improved today.  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  SRA pending.  Recommend argatroban if platelet count is above 50,000.  Will defer to PCCM.  The muscle biopsy preliminary report called to me was reported as focal inflammation which was not abundant, some minor changes of myositis, but not conclusive.  Final pathology is still pending.  We will follow-up on the final pathology result.  Recommendations:  1.  Transfuse packed red blood cells as needed 2.  Daily CBC 3.  We will follow-up on final muscle biopsy results. 4.  Steroids as recommended by the critical care and neurology services 5.  CRRT per Nephrology.  6.  Continue trial of daily plasmapheresis x5 days. 7.  Follow-up on SRA.  Will defer to PCCM regarding initiation of argatroban.    LOS: 14 days  Dennis Howard was interviewed and examined.  He was more alert when I saw him at approximately 6:30 AM today.  He was oriented and following commands.  The CPK and LDH are improved.  The platelet count is better today.  It is unclear whether these improvements are related to steroids or plasma exchange.  The preliminary report from the muscle biopsy does not reveal a specific diagnosis.  The platelet count recovery may be related to the above treatments or resolution of  heparin thrombocytopenia.  The serotonin release assay is pending.  I agree with resuming anticoagulation in the setting.  The plan is to complete 5 days of plasma exchange and then continue steroids.  Hematology will continue following him daily.  I was present for greater than 50% of today's visit.  I performed medical decision making.

## 2020-09-27 NOTE — Progress Notes (Signed)
Advanced Heart Failure Rounding Note  PCP-Cardiologist: No primary care provider on file.    Subjective   55 y/o male w/ no prior cardiac history, admitted for mixed cardiogenic + septic shock in setting of PNA + hepatic encephalopathy.   Remains on DBA 6.5 mcg/kg/min + NE 15 mcg. Co-ox 45% Today will be day #4 PLEX Remains on HD   More alert today. Able to sit up on side of bed with PT. More talkative. Says legs hurt.  Platelets trending up slightly. Now 35k. CCM started Bival. CKs and LDH trending down.    ECHO LVEF 15% with severe global HK with regional variability + LV clot RV moderately down. Initial Co-ox 48%. Started on DBA.   Objective:   Weight Range: 74.8 kg Body mass index is 22.37 kg/m.   Vital Signs:   Temp:  [97.8 F (36.6 C)-98.9 F (37.2 C)] 97.8 F (36.6 C) (04/29 1200) Pulse Rate:  [109-120] 111 (04/29 1400) Resp:  [7-28] 10 (04/29 1400) BP: (116-143)/(79-106) 133/93 (04/29 1400) SpO2:  [84 %-100 %] 100 % (04/29 1400) Weight:  [74.8 kg] 74.8 kg (04/29 0500) Last BM Date: 09/27/20  Weight change: Filed Weights   09/25/20 0500 09/26/20 0500 09/27/20 0500  Weight: 78.3 kg 76.9 kg 74.8 kg    Intake/Output:   Intake/Output Summary (Last 24 hours) at 09/27/2020 1433 Last data filed at 09/27/2020 1400 Gross per 24 hour  Intake 2703.87 ml  Output 3988 ml  Net -1284.13 ml      Physical Exam   General:  Weak appearing. No resp difficulty HEENT: normal Neck: supple. LIJ HD cath Carotids 2+ bilat; no bruits. No lymphadenopathy or thryomegaly appreciated. Cor: PMI nondisplaced. Regular rate & rhythm. No rubs, gallops or murmurs. Lungs: clear Abdomen: soft, nontender, nondistended. No hepatosplenomegaly. No bruits or masses. Good bowel sounds. Extremities: no cyanosis, clubbing, rash, 2+ edema left thigh hematoma Neuro: alert & orientedx3, cranial nerves grossly intact. moves all 4 extremities w/o difficulty. Affect pleasant   Telemetry    Sinus tach 110-120 Personally reviewed  Labs    CBC Recent Labs    09/26/20 1518 09/27/20 0301  WBC 28.2* 26.1*  NEUTROABS  --  23.7*  HGB 7.6* 7.4*  HCT 24.6* 24.0*  MCV 96.5 96.8  PLT 24* 35*   Basic Metabolic Panel Recent Labs    58/85/02 0320 09/26/20 1551 09/27/20 0301  NA 136  136 134* 135  K 4.5  4.4 4.8 4.8  CL 99  99 98 98  CO2 27  26 25 28   GLUCOSE 146*  146* 213* 147*  BUN 69*  69* 71* 65*  CREATININE 2.03*  2.00* 2.01* 1.82*  CALCIUM 7.4*  7.4* 6.9* 7.4*  MG 2.8*  --  2.6*  PHOS 4.6  4.5 4.8* 3.7   Liver Function Tests Recent Labs    09/25/20 0315 09/25/20 0759 09/26/20 0320 09/26/20 1551 09/27/20 0301  AST 282*  --  271*  --   --   ALT 288*  --  247*  --   --   ALKPHOS 100  --  88  --   --   BILITOT 1.7*  --  1.4*  --   --   PROT 5.3*  --  5.6*  --   --   ALBUMIN 2.8*   < > 3.1*  3.0* 2.6* 2.9*   < > = values in this interval not displayed.   No results for input(s): LIPASE, AMYLASE in the last  72 hours. Cardiac Enzymes Recent Labs    09/25/20 0759 09/26/20 0320 09/27/20 0301  CKTOTAL 33,136* 25,775* 14,656*    BNP: BNP (last 3 results) Recent Labs    09-24-2020 2114 09/14/20 0031 09/15/20 0321  BNP 1,447.9* 1,523.7* 743.8*    ProBNP (last 3 results) No results for input(s): PROBNP in the last 8760 hours.   D-Dimer No results for input(s): DDIMER in the last 72 hours. Hemoglobin A1C No results for input(s): HGBA1C in the last 72 hours. Fasting Lipid Panel No results for input(s): CHOL, HDL, LDLCALC, TRIG, CHOLHDL, LDLDIRECT in the last 72 hours. Thyroid Function Tests No results for input(s): TSH, T4TOTAL, T3FREE, THYROIDAB in the last 72 hours.  Invalid input(s): FREET3  Other results:   Imaging    No results found.   Medications:     Scheduled Medications: . calcium gluconate  2 g Intravenous Once  . calcium gluconate  2 g Intravenous Once  . capsaicin   Topical BID  . Chlorhexidine  Gluconate Cloth  6 each Topical Daily  . insulin aspart  0-20 Units Subcutaneous Q4H  . insulin aspart  4 Units Subcutaneous Q4H  . insulin detemir  15 Units Subcutaneous BID  . lidocaine   Topical BID  . magic mouthwash  10 mL Oral TID  . mouth rinse  15 mL Mouth Rinse BID  . multivitamin with minerals  1 tablet Per Tube Daily  . pantoprazole  40 mg Intravenous Q12H  . sodium chloride flush  10-40 mL Intracatheter Q12H  . thiamine injection  100 mg Intravenous Daily    Infusions: . sodium chloride 10 mL/hr at 09/27/20 1400  . anticoagulant sodium citrate    . anticoagulant sodium citrate    . anticoagulant sodium citrate    . bivalirudin (ANGIOMAX) infusion 0.5 mg/mL (Non-ACS indications) 0.05 mg/kg/hr (09/27/20 1400)  . calcium gluconate Stopped (09/25/20 1040)  . calcium gluconate    . citrate dextrose Stopped (09/24/20 1900)  . citrate dextrose    . citrate dextrose    . DOBUTamine 6.5 mcg/kg/min (09/27/20 1400)  . feeding supplement (PIVOT 1.5 CAL) 1,000 mL (09/27/20 0512)  . methylPREDNISolone (SOLU-MEDROL) injection Stopped (09/27/20 1343)  . norepinephrine (LEVOPHED) Adult infusion 15 mcg/min (09/27/20 1400)  . prismasol BGK 2/2.5 dialysis solution 1,500 mL/hr at 09/27/20 0827  . prismasol BGK 2/2.5 replacement solution 500 mL/hr at 09/27/20 0738  . prismasol BGK 2/2.5 replacement solution 300 mL/hr at 09/27/20 0203    PRN Medications: sodium chloride, acetaminophen (TYLENOL) oral liquid 160 mg/5 mL, diphenhydrAMINE, docusate, oxyCODONE, sodium chloride flush    Patient Profile   55 y/o male w/ no prior cardiac history, admitted for mixed cardiogenic + septic shock in setting of PNA + hepatic encephalopathy.   ECHO LVEF 15% with severe global HK with regional variability + LV clot RV moderately down. Initial Co-ox 48%. Started on DBA.    Assessment/Plan   1. Cardiogenic shock with multisystem organ failure - ECHO LVEF 15% with severe global HK with regional  variability + LV clot RV moderately down. - Remains on DBA 6.5. NE 15 --> CO-OX 40% -> 45% - On CVVHD for volume removal. Weight down 18 pounds. Still with some fluid to give - Unifying diagnosis remains unclear. Serologies unrevealing so far. Preliminary muscle bx results nonspecific  - Weakness and MSOF well out of proportion to HF.  - Seems to be responding to PLEX. Today is day 4/5. Would continue   2. LV thrombus - has been  off heparin due to thigh bleed (at muscle bx site) and thrombocytopenia - HIT screen + SRA pending - Bival started today by CCM  3. Severe myositis/rhabdo - Serologies unrevealing so far. Preliminary muscle bx results nonspecific  - Neuro and onc have seen - seems to be responding to PLEX. Today is 4/5   4. AKI due to ATN/shock - Baseline creatinine unknown  - SCr 1.5 in 2016 in Care Everywhere - Started CVVHD 4/25.  - Per Nephrology   5. CAP - PCT 3.70 -> 2.9 - BC NGTD  -Afebrile,  WBC 37>31.6 >32.7, on steroids - was on cefepime and ancef. Off antibiotics.    6. ABLA - likely due to left thigh hematoma +/- GI bleed - Hgb 8.2   7. 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   Continue NE and DBA over the weekend. I will see again Sunday. Please call with questions.   CRITICAL CARE Performed by: Arvilla Meres  Total critical care time: 45 minutes  Critical care time was exclusive of separately billable procedures and treating other patients.  Critical care was necessary to treat or prevent imminent or life-threatening deterioration.  Critical care was time spent personally by me (independent of midlevel providers or residents) on the following activities: development of treatment plan with patient and/or surrogate as well as nursing, discussions with consultants, evaluation of patient's response to treatment, examination of patient, obtaining history from patient or surrogate, ordering and  performing treatments and interventions, ordering and review of laboratory studies, ordering and review of radiographic studies, pulse oximetry and re-evaluation of patient's condition.    Length of Stay: 14  Arvilla Meres, MD  09/27/2020, 2:33 PM  Advanced Heart Failure Team Pager [phone] (M-F; 7a - 5p)  Please contact [facility] for night-coverage after hours (5p -7a ) and weekends on [email or web address]

## 2020-09-27 NOTE — Progress Notes (Signed)
ANTICOAGULATION CONSULT NOTE  Pharmacy Consult for Bivalirudin Indication: LV mural thrombus  Heparin Dosing Weight:  77.4 kg  Labs: Recent Labs    09/25/20 0759 09/25/20 1725 09/26/20 0320 09/26/20 1518 09/26/20 1551 09/27/20 0301 09/27/20 1611  HGB  --   --  8.2* 7.6*  --  7.4*  --   HCT  --   --  25.7* 24.6*  --  24.0*  --   PLT  --   --  29* 24*  --  35*  --   APTT  --   --   --   --   --   --  41*  CREATININE 2.24*   < > 2.03*  2.00*  --  2.01* 1.82* 1.69*  CKTOTAL 33,136*  --  25,775*  --   --  14,656*  --    < > = values in this interval not displayed.   Assessment: 55 yr old man presented with AMS. Pharmacy was originally consulted for heparin for NSTEMI, then continued for discovered LV thrombus; pt was on no anticoagulants PTA. Pharmacy now consulted for bivalirudin.  MRI brain on 4/20 with multiple acute infarcts and subacute ischemic changes. Per Neurology, will target low end of goal range.  Patient was on IV heparin 4/16 until 4/26 (held intermittently for concerns of bleeding), stopped on 4/26 due to platelets dropping to 21. 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 SRA ordered 4/27, results anticipated 4/30-5/1 (although results may be delayed due to weekend?). Patient is off all heparin.  Upper and lower US obtained, preliminary result without evidence of DVTs. Platelets improved today to 35. No s/sx bleeding reported. Given significant LV thrombus and higher risk for thrombosis with possible HIT, per CCM will start anticoagulation now. No specific platelet cut off to stop anticoagulation but will monitor closely. Prefer bivalirudin over argatroban due to liver dysfunction.  Patient also on CRRT, pausing for ~4 hours when completing plasmapheresis daily, plan to start TPE this afternoon around 1800 per discussion with RN - would expect it to last around 3h and be completed by 2100 or so.   The patient's aPTT was SUBtherapeutic and noted to be  drawn a little early at ~3h after drip initiation.The half life will be expected to be prolonged with CRRT held during TPE. In addition, it is unclear how much TPE will effect Bival levels and/or coag factors. Will plan to check the next level ~1-2h after the end of TPE.   Goal of Therapy:  Goal aPTT 50-85 (no boluses) -- target lower end of goal ~50-70 given acute infarcts Monitor platelets by anticoagulation protocol: Yes   Plan:  - Increase Bivalirudin to 0.06 mg/kg/hr (on CRRT) - Will recheck the next aPTT a little delayed around 2300 to provide some time for equilibration after TPE.  - aPTT q12h, daily CBC, monitor renal function/CRRT, liver function (BMP at least weekly) - Monitor weight for significant changes to adjust infusion rate  Thank you for allowing pharmacy to be a part of this patient's care.  Georgina Pillion, PharmD, BCPS Clinical Pharmacist Clinical phone for 09/27/2020: [phone] 09/27/2020 5:35 PM   **Pharmacist phone directory can now be found on [email or web address] ([email or web address]).  Listed under [facility].

## 2020-09-27 NOTE — Progress Notes (Signed)
[facility] NEPHROLOGY PROGRESS NOTE  Assessment/ Plan: Pt is a 55 y.o. yo male  with history of hypertension, CKD was admitted on 4/15 with altered mental status, found to have systolic CHF, respiratory failure required mechanical ventilation, rhabdomyolysis, now seen as a consultation for the evaluation of acute kidney injury.  #Acute kidney injury on CKD stage III multifactorial etiology including ischemic ATN due to cardiogenic/septic shock, severe CHF with poor renal perfusion, rhabdomyolysis.  He is on inotrope and pressors to maintain blood pressure.  Given declining urine output, low GFR, azotemia and very high CK level, CRRT started on 4/25.  I have discussed this with the patient's mother and ICU team.  CRRT tolerating well with UF around 50-100 cc/hour.  Currently on all 2K bath with acceptable potassium level. UF as tolerated by BP.  He is already on IV heparin. I have explained to patient's mother that he may not able to tolerate regular IHD.  Continue CRRT with same prescription.  #Acute metabolic encephalopathy, also has uremia:  CT scan with stroke.  #Acute systolic CHF with cardiogenic shock, LV thrombus: Seen by cardiologist.  Currently on dobutamine, heparin drip.  Not much improvement.  #Pulmonary infiltrates, pneumonia: Completed antibiotics.  #Diffuse muscle weakness with elevated CPK, nontraumatic rhabdomyolysis status post muscle biopsy pending results.  Currently on IV Solu-Medrol and plasma exchange by hematologist.  Platelet count 35 today.  #Multiorgan failure including congestive hepatopathy, renal failure, CHF, stroke.  Currently partial code.  Looks poor prognosis.  Subjective: Seen and examined.  No urine output.  Tolerating CRRT well.  He looks more alert today than yesterday.  Currently on dobutamine and Levophed. Objective Vital signs in last 24 hours: Vitals:   09/27/20 0800 09/27/20 0815 09/27/20 0830 09/27/20 0845  BP: (!) 140/97 (!)  134/96 (!) 137/99 (!) 138/99  Pulse: (!) 109 (!) 109 (!) 109 (!) 109  Resp: (!) 9 (!) 8 11 10   Temp:      TempSrc:      SpO2: 100% 100% 100% 100%  Weight:      Height:       Weight change: -2.1 kg  Intake/Output Summary (Last 24 hours) at 09/27/2020 0940 Last data filed at 09/27/2020 0900 Gross per 24 hour  Intake 2589.58 ml  Output 4469 ml  Net -1879.42 ml       Labs: Basic Metabolic Panel: Recent Labs  Lab 09/26/20 0320 09/26/20 1551 09/27/20 0301  NA 136  136 134* 135  K 4.5  4.4 4.8 4.8  CL 99  99 98 98  CO2 27  26 25 28   GLUCOSE 146*  146* 213* 147*  BUN 69*  69* 71* 65*  CREATININE 2.03*  2.00* 2.01* 1.82*  CALCIUM 7.4*  7.4* 6.9* 7.4*  PHOS 4.6  4.5 4.8* 3.7   Liver Function Tests: Recent Labs  Lab 09/24/20 0510 09/24/20 1601 09/25/20 0315 09/25/20 0759 09/26/20 0320 09/26/20 1551 09/27/20 0301  AST 496*  --  282*  --  271*  --   --   ALT 630*  --  288*  --  247*  --   --   ALKPHOS 188*  --  100  --  88  --   --   BILITOT 1.9*  --  1.7*  --  1.4*  --   --   PROT 5.4*  --  5.3*  --  5.6*  --   --   ALBUMIN 2.1*   < > 2.8*   < >  3.1*  3.0* 2.6* 2.9*   < > = values in this interval not displayed.   No results for input(s): LIPASE, AMYLASE in the last 168 hours. No results for input(s): AMMONIA in the last 168 hours. CBC: Recent Labs  Lab 09/24/20 0510 09/25/20 0315 09/26/20 0320 09/26/20 1518 09/27/20 0301  WBC 36.2* 31.6* 32.7* 28.2* 26.1*  NEUTROABS  --   --   --   --  23.7*  HGB 8.7* 8.1* 8.2* 7.6* 7.4*  HCT 27.6* 26.2* 25.7* 24.6* 24.0*  MCV 96.5 96.3 95.5 96.5 96.8  PLT 26* 21* 29* 24* 35*   Cardiac Enzymes: Recent Labs  Lab 09/23/20 0755 09/24/20 0510 09/25/20 0759 09/26/20 0320 09/27/20 0301  CKTOTAL 32,320* 42,626* 33,136* 25,775* 14,656*   CBG: Recent Labs  Lab 09/26/20 1516 09/26/20 1953 09/26/20 2308 09/27/20 0308 09/27/20 0722  GLUCAP 172* 163* 155* 147* 128*    Iron Studies: No results for  input(s): IRON, TIBC, TRANSFERRIN, FERRITIN in the last 72 hours. Studies/Results: VAS [place] LOWER EXTREMITY VENOUS (DVT)  Result Date: 09/26/2020  Lower Venous DVT Study Patient Name:  Emani Morad.  Date of Exam:   09/26/2020 Medical Rec #: [id number]         Accession #:    [id number] Date of Birth: 26-Jan-1966        Patient Gender: M Patient Age:   54Y Exam Location:  [facility] Procedure:      VAS [place] LOWER EXTREMITY VENOUS (DVT) Referring Phys: [id number] JESSICA MARSHALL --------------------------------------------------------------------------------  Indications: Edema. Other Indications: Cardiogenic and septic shock. Comparison Study: No prior Performing Technologist: Marilynne Halsted RDMS, RVT  Examination Guidelines: A complete evaluation includes B-mode imaging, spectral Doppler, color Doppler, and power Doppler as needed of all accessible portions of each vessel. Bilateral testing is considered an integral part of a complete examination. Limited examinations for reoccurring indications may be performed as noted. The reflux portion of the exam is performed with the patient in reverse Trendelenburg.  +---------+---------------+---------+-----------+----------+-------------------+ RIGHT    CompressibilityPhasicitySpontaneityPropertiesThrombus Aging      +---------+---------------+---------+-----------+----------+-------------------+ CFV      Full           Yes      Yes                                      +---------+---------------+---------+-----------+----------+-------------------+ SFJ      Full                                                             +---------+---------------+---------+-----------+----------+-------------------+ FV Prox  Full                                                             +---------+---------------+---------+-----------+----------+-------------------+ FV Mid   Full                                                              +---------+---------------+---------+-----------+----------+-------------------+  FV DistalFull                                                             +---------+---------------+---------+-----------+----------+-------------------+ PFV      Full                                                             +---------+---------------+---------+-----------+----------+-------------------+ POP      Full           Yes      Yes                                      +---------+---------------+---------+-----------+----------+-------------------+ PTV      Full                                                             +---------+---------------+---------+-----------+----------+-------------------+ PERO                                                  Not well visualized +---------+---------------+---------+-----------+----------+-------------------+   +---------+---------------+---------+-----------+----------+-------------------+ LEFT     CompressibilityPhasicitySpontaneityPropertiesThrombus Aging      +---------+---------------+---------+-----------+----------+-------------------+ CFV      Full           Yes      Yes                                      +---------+---------------+---------+-----------+----------+-------------------+ SFJ      Full                                                             +---------+---------------+---------+-----------+----------+-------------------+ FV Prox  Full                                                             +---------+---------------+---------+-----------+----------+-------------------+ FV Mid   Full                                                             +---------+---------------+---------+-----------+----------+-------------------+ FV DistalFull                                                             +---------+---------------+---------+-----------+----------+-------------------+  PFV      Full                                                             +---------+---------------+---------+-----------+----------+-------------------+ POP      Full                                                             +---------+---------------+---------+-----------+----------+-------------------+ PTV      Full                                                             +---------+---------------+---------+-----------+----------+-------------------+ PERO                                                  Not well visualized +---------+---------------+---------+-----------+----------+-------------------+     Summary: RIGHT: - There is no evidence of deep vein thrombosis in the lower extremity. However, portions of this examination were limited- see technologist comments above.  LEFT: - There is no evidence of deep vein thrombosis in the lower extremity. However, portions of this examination were limited- see technologist comments above.  *See table(s) above for measurements and observations.    Preliminary    VAS [place] UPPER EXTREMITY VENOUS DUPLEX  Result Date: 09/26/2020 UPPER VENOUS STUDY  Patient Name:  Tome Wilson.  Date of Exam:   09/26/2020 Medical Rec #: [id number]         Accession #:    [id number] Date of Birth: 04/07/1966        Patient Gender: M Patient Age:   54Y Exam Location:  [facility] Procedure:      VAS [place] UPPER EXTREMITY VENOUS DUPLEX Referring Phys: [id number] JESSICA MARSHALL --------------------------------------------------------------------------------  Indications: Edema Other Indications: Cardiogenic and septic shock. Limitations: Bandages. Performing Technologist: Marilynne Halsted RDMS, RVT  Examination Guidelines: A complete evaluation includes B-mode imaging, spectral Doppler, color Doppler, and power Doppler as needed of all accessible portions of each vessel. Bilateral testing is considered an integral part of a complete examination.  Limited examinations for reoccurring indications may be performed as noted.  Right Findings: +----------+------------+---------+-----------+----------+--------------------+ RIGHT     CompressiblePhasicitySpontaneousProperties      Summary        +----------+------------+---------+-----------+----------+--------------------+ IJV                                                  tape & bandage in  place         +----------+------------+---------+-----------+----------+--------------------+ Subclavian               Yes       Yes                                   +----------+------------+---------+-----------+----------+--------------------+ Axillary      Full       Yes       Yes                                   +----------+------------+---------+-----------+----------+--------------------+ Brachial      Full                                                       +----------+------------+---------+-----------+----------+--------------------+ Radial        Full                                       prox only       +----------+------------+---------+-----------+----------+--------------------+ Ulnar         Full                                       prox only       +----------+------------+---------+-----------+----------+--------------------+ Cephalic      Full                                                       +----------+------------+---------+-----------+----------+--------------------+ Basilic       Full                                                       +----------+------------+---------+-----------+----------+--------------------+ Right neck line in place - IJV not visualized. Right forearm segments visualized only.  Left Findings: +----------+------------+---------+-----------+----------+--------------+ LEFT      CompressiblePhasicitySpontaneousProperties   Summary      +----------+------------+---------+-----------+----------+--------------+ IJV                                                 Not visualized +----------+------------+---------+-----------+----------+--------------+ Subclavian    Full       Yes       Yes                             +----------+------------+---------+-----------+----------+--------------+ Axillary      Full       Yes       Yes                             +----------+------------+---------+-----------+----------+--------------+  Brachial    Partial                                                +----------+------------+---------+-----------+----------+--------------+ Radial        Full                                                 +----------+------------+---------+-----------+----------+--------------+ Ulnar         Full                                                 +----------+------------+---------+-----------+----------+--------------+ Cephalic                                            Not visualized +----------+------------+---------+-----------+----------+--------------+ Basilic       Full                                                 +----------+------------+---------+-----------+----------+--------------+  Summary: No evidence of deep vein or superficial vein thrombosis involving the right and left upper extremities in the veins that were visualized. See tech's note.  *See table(s) above for measurements and observations.     Preliminary     Medications: Infusions: . sodium chloride 10 mL/hr at 09/27/20 0900  . anticoagulant sodium citrate    . anticoagulant sodium citrate    . calcium gluconate Stopped (09/25/20 1040)  . citrate dextrose Stopped (09/24/20 1900)  . citrate dextrose    . DOBUTamine 6.5 mcg/kg/min (09/27/20 0900)  . feeding supplement (PIVOT 1.5 CAL) 1,000 mL (09/27/20 0512)  . methylPREDNISolone (SOLU-MEDROL) injection Stopped (09/27/20 0739)  .  norepinephrine (LEVOPHED) Adult infusion 15 mcg/min (09/27/20 0900)  . prismasol BGK 2/2.5 dialysis solution 1,500 mL/hr at 09/27/20 0827  . prismasol BGK 2/2.5 replacement solution 500 mL/hr at 09/27/20 0738  . prismasol BGK 2/2.5 replacement solution 300 mL/hr at 09/27/20 0203    Scheduled Medications: . calcium gluconate  2 g Intravenous Once  . capsaicin   Topical BID  . Chlorhexidine Gluconate Cloth  6 each Topical Daily  . insulin aspart  0-20 Units Subcutaneous Q4H  . insulin aspart  4 Units Subcutaneous Q4H  . insulin detemir  15 Units Subcutaneous BID  . lidocaine   Topical BID  . magic mouthwash  10 mL Oral TID  . mouth rinse  15 mL Mouth Rinse BID  . multivitamin with minerals  1 tablet Per Tube Daily  . pantoprazole  40 mg Intravenous Q12H  . sodium chloride flush  10-40 mL Intracatheter Q12H  . thiamine injection  100 mg Intravenous Daily    have reviewed scheduled and prn medications.  Physical Exam: General: Critically ill looking male, able to open eyes with the name. Heart:RRR, s1s2 nl Lungs: Clear anteriorly, no increased work of breathing Abdomen:soft, Non-tender, non-distended Extremities: LE edema present not much  change Dialysis Access: L IJ temporary HD catheter in place  Jahid Weida Prasad Lashonda Sonneborn 09/27/2020,9:40 AM  LOS: 14 days

## 2020-09-28 DIAGNOSIS — Z4659 Encounter for fitting and adjustment of other gastrointestinal appliance and device: Secondary | ICD-10-CM

## 2020-09-28 DIAGNOSIS — D62 Acute posthemorrhagic anemia: Secondary | ICD-10-CM | POA: Diagnosis not present

## 2020-09-28 DIAGNOSIS — G9340 Encephalopathy, unspecified: Secondary | ICD-10-CM | POA: Diagnosis not present

## 2020-09-28 DIAGNOSIS — J9690 Respiratory failure, unspecified, unspecified whether with hypoxia or hypercapnia: Secondary | ICD-10-CM

## 2020-09-28 DIAGNOSIS — R069 Unspecified abnormalities of breathing: Secondary | ICD-10-CM

## 2020-09-28 DIAGNOSIS — N170 Acute kidney failure with tubular necrosis: Secondary | ICD-10-CM | POA: Diagnosis not present

## 2020-09-28 DIAGNOSIS — J189 Pneumonia, unspecified organism: Secondary | ICD-10-CM

## 2020-09-28 DIAGNOSIS — N19 Unspecified kidney failure: Secondary | ICD-10-CM

## 2020-09-28 DIAGNOSIS — E875 Hyperkalemia: Secondary | ICD-10-CM

## 2020-09-28 DIAGNOSIS — R570 Cardiogenic shock: Secondary | ICD-10-CM | POA: Diagnosis not present

## 2020-09-28 LAB — THERAPEUTIC PLASMA EXCHANGE (BLOOD BANK)
Plasma Exchange: 3790
Plasma volume needed: 3790
Unit division: 0
Unit division: 0
Unit division: 0
Unit division: 0
Unit division: 0
Unit division: 0
Unit division: 0
Unit division: 0
Unit division: 0
Unit division: 0
Unit division: 0

## 2020-09-28 LAB — RENAL FUNCTION PANEL
Albumin: 3 g/dL — ABNORMAL LOW (ref 3.5–5.0)
Albumin: 2.8 g/dL — ABNORMAL LOW (ref 3.5–5.0)
Anion gap: 10 (ref 5–15)
Anion gap: 12 (ref 5–15)
BUN: 67 mg/dL — ABNORMAL HIGH (ref 6–20)
BUN: 69 mg/dL — ABNORMAL HIGH (ref 6–20)
CO2: 28 mmol/L (ref 22–32)
CO2: 29 mmol/L (ref 22–32)
Calcium: 7.6 mg/dL — ABNORMAL LOW (ref 8.9–10.3)
Calcium: 8.1 mg/dL — ABNORMAL LOW (ref 8.9–10.3)
Chloride: 98 mmol/L (ref 98–111)
Chloride: 96 mmol/L — ABNORMAL LOW (ref 98–111)
Creatinine, Ser: 1.79 mg/dL — ABNORMAL HIGH (ref 0.61–1.24)
Creatinine, Ser: 1.92 mg/dL — ABNORMAL HIGH (ref 0.61–1.24)
GFR, Estimated: 45 mL/min — ABNORMAL LOW (ref >60)
GFR, Estimated: 41 mL/min — ABNORMAL LOW (ref >60)
Glucose, Bld: 166 mg/dL — ABNORMAL HIGH (ref 70–99)
Glucose, Bld: 236 mg/dL — ABNORMAL HIGH (ref 70–99)
Phosphorus: 3.7 mg/dL (ref 2.5–4.6)
Phosphorus: 4.7 mg/dL — ABNORMAL HIGH (ref 2.5–4.6)
Potassium: 4.4 mmol/L (ref 3.5–5.1)
Potassium: 4.3 mmol/L (ref 3.5–5.1)
Sodium: 136 mmol/L (ref 135–145)
Sodium: 137 mmol/L (ref 135–145)

## 2020-09-28 LAB — MAGNESIUM: Magnesium: 2.6 mg/dL — ABNORMAL HIGH (ref 1.7–2.4)

## 2020-09-28 LAB — GLUCOSE, CAPILLARY
Glucose-Capillary: 158 mg/dL — ABNORMAL HIGH (ref 70–99)
Glucose-Capillary: 134 mg/dL — ABNORMAL HIGH (ref 70–99)
Glucose-Capillary: 160 mg/dL — ABNORMAL HIGH (ref 70–99)
Glucose-Capillary: 170 mg/dL — ABNORMAL HIGH (ref 70–99)
Glucose-Capillary: 147 mg/dL — ABNORMAL HIGH (ref 70–99)
Glucose-Capillary: 184 mg/dL — ABNORMAL HIGH (ref 70–99)

## 2020-09-28 LAB — COPPER, SERUM: Copper: 90 ug/dL (ref 69–132)

## 2020-09-28 LAB — ROCKY MTN SPOTTED FVR ABS PNL(IGG+IGM)
RMSF IgG: NEGATIVE
RMSF IgM: 0.45 index (ref 0.00–0.89)

## 2020-09-28 LAB — COOXEMETRY PANEL
Carboxyhemoglobin: 1.5 % (ref 0.5–1.5)
Methemoglobin: 0.9 % (ref 0.0–1.5)
O2 Saturation: 50.6 %
Total hemoglobin: 7.3 g/dL — ABNORMAL LOW (ref 12.0–16.0)

## 2020-09-28 LAB — CBC
HCT: 23.9 % — ABNORMAL LOW (ref 39.0–52.0)
Hemoglobin: 7.1 g/dL — ABNORMAL LOW (ref 13.0–17.0)
MCH: 29.5 pg (ref 26.0–34.0)
MCHC: 29.7 g/dL — ABNORMAL LOW (ref 30.0–36.0)
MCV: 99.2 fL (ref 80.0–100.0)
Platelets: 55 10*3/uL — ABNORMAL LOW (ref 150–400)
RBC: 2.41 MIL/uL — ABNORMAL LOW (ref 4.22–5.81)
RDW: 17.5 % — ABNORMAL HIGH (ref 11.5–15.5)
WBC: 25.5 10*3/uL — ABNORMAL HIGH (ref 4.0–10.5)
nRBC: 1 % — ABNORMAL HIGH (ref 0.0–0.2)

## 2020-09-28 LAB — APTT
aPTT: 50 seconds — ABNORMAL HIGH (ref 24–36)
aPTT: 45 seconds — ABNORMAL HIGH (ref 24–36)
aPTT: 45 seconds — ABNORMAL HIGH (ref 24–36)
aPTT: 52 seconds — ABNORMAL HIGH (ref 24–36)

## 2020-09-28 LAB — CK: Total CK: 7402 U/L — ABNORMAL HIGH (ref 49–397)

## 2020-09-28 LAB — LACTATE DEHYDROGENASE: LDH: 396 U/L — ABNORMAL HIGH (ref 98–192)

## 2020-09-28 LAB — SAVE SMEAR(SSMR), FOR PROVIDER SLIDE REVIEW

## 2020-09-28 MED ORDER — CALCIUM GLUCONATE-NACL 2-0.675 GM/100ML-% IV SOLN
INTRAVENOUS | Status: AC
  Administered 2020-09-28: 2000 mg via INTRAVENOUS
  Filled 2020-09-28: qty 100

## 2020-09-28 MED ORDER — DIPHENHYDRAMINE HCL 25 MG PO CAPS: 25.0000 mg | ORAL_CAPSULE | Freq: Four times a day (QID) | ORAL | Status: DC | PRN

## 2020-09-28 MED ORDER — ACETAMINOPHEN 325 MG PO TABS: 650.0000 mg | ORAL_TABLET | ORAL | Status: DC | PRN

## 2020-09-28 MED ORDER — GABAPENTIN 100 MG PO CAPS
100.0000 mg | ORAL_CAPSULE | Freq: Every day | ORAL | Status: DC
  Administered 2020-09-28 – 2020-10-02 (×5): 100 mg via ORAL
  Filled 2020-09-28 (×5): qty 1

## 2020-09-28 MED ORDER — ACD FORMULA A 0.73-2.45-2.2 GM/100ML VI SOLN
1000.0000 mL | Status: DC
  Filled 2020-09-28 (×3): qty 1000

## 2020-09-28 MED ORDER — CALCIUM CARBONATE ANTACID 500 MG PO CHEW
2.0000 | CHEWABLE_TABLET | ORAL | Status: AC
  Administered 2020-09-28: 400 mg via ORAL
  Filled 2020-09-28: qty 2

## 2020-09-28 MED ORDER — ACD FORMULA A 0.73-2.45-2.2 GM/100ML VI SOLN
Status: AC
  Filled 2020-09-28: qty 500

## 2020-09-28 MED ORDER — CALCIUM GLUCONATE-NACL 2-0.675 GM/100ML-% IV SOLN
2.0000 g | Freq: Once | INTRAVENOUS | Status: AC
  Filled 2020-09-28: qty 100

## 2020-09-28 MED ORDER — ANTICOAGULANT SODIUM CITRATE 4% (200MG/5ML) IV SOLN
5.0000 mL | Freq: Once | Status: AC
  Administered 2020-09-30: 5 mL
  Filled 2020-09-28 (×5): qty 5

## 2020-09-28 NOTE — Progress Notes (Signed)
ANTICOAGULATION CONSULT NOTE  Pharmacy Consult for Bivalirudin Indication: LV mural thrombus  Heparin Dosing Weight:  77.4 kg  Labs: Recent Labs    09/26/20 0320 09/26/20 1518 09/26/20 1551 09/27/20 0301 09/27/20 1611 09/27/20 1611 09/27/20 2321 09/28/20 0301 09/28/20 1258  HGB 8.2* 7.6*  --  7.4*  --   --   --  7.1*  --   HCT 25.7* 24.6*  --  24.0*  --   --   --  23.9*  --   PLT 29* 24*  --  35*  --   --   --  55*  --   APTT  --   --   --   --  41*   < > 50* 45* 45*  CREATININE 2.03*  2.00*  --    < > 1.82* 1.69*  --   --  1.79*  --   CKTOTAL 25,775*  --   --  62,229*  --   --   --  7,402*  --    < > = values in this interval not displayed.   Assessment: 55 yr old man presented with AMS. Pharmacy was originally consulted for heparin for NSTEMI, then continued for discovered LV thrombus; pt was on no anticoagulants PTA. Pharmacy now consulted for bivalirudin.  MRI brain on 4/20 with multiple acute infarcts and subacute ischemic changes. Per Neurology, will target low end of goal range.  Patient was on IV heparin 4/16 until 4/26 (held intermittently for concerns of bleeding), stopped on 4/26 due to platelets dropping to 21. 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 SRA ordered 4/27, results anticipated 4/30-5/1 (although results may be delayed due to weekend?). Patient is off all heparin.  Upper and lower US obtained, preliminary result without evidence of DVTs. Platelets improved today to 35. No s/sx bleeding reported. Given significant LV thrombus and higher risk for thrombosis with possible HIT, per CCM will start anticoagulation now. No specific platelet cut off to stop anticoagulation but will monitor closely. Prefer bivalirudin over argatroban due to liver dysfunction.  Patient also on CRRT, pausing for ~4 hours when completing plasmapheresis daily.  The patient's aPTT is still SUBtherapeutic - will increase by 20%.  Goal of Therapy:  Goal aPTT 50-85  (no boluses) -- target lower end of goal ~50-70 given acute infarcts Monitor platelets by anticoagulation protocol: Yes   Plan:  - Increase Bivalirudin to 0.085 mg/kg/hr (on CRRT) - Will recheck the next aPTT in 6 hours (4 hours after [place])  - aPTT q12h, daily CBC, monitor renal function/CRRT, liver function (BMP at least weekly) - Monitor weight for significant changes to adjust infusion rate  Thank you for allowing pharmacy to be a part of this patient's care.  Jeanella Cara, PharmD, [facility] Clinical Pharmacist Please see AMION for all Pharmacists' Contact Phone Numbers 09/28/2020, 3:05 PM

## 2020-09-28 NOTE — Progress Notes (Signed)
ANTICOAGULATION CONSULT NOTE - Follow Up Consult  Pharmacy Consult for bivalirudin Indication: LV mural thrombus  Labs: Recent Labs    09/25/20 0759 09/25/20 1725 09/26/20 0320 09/26/20 1518 09/26/20 1551 09/27/20 0301 09/27/20 1611 09/27/20 2321  HGB  --   --  8.2* 7.6*  --  7.4*  --   --   HCT  --   --  25.7* 24.6*  --  24.0*  --   --   PLT  --   --  29* 24*  --  35*  --   --   APTT  --   --   --   --   --   --  41* 50*  CREATININE 2.24*   < > 2.03*  2.00*  --  2.01* 1.82* 1.69*  --   CKTOTAL 33,136*  --  02,585*  --   --  14,656*  --   --    < > = values in this interval not displayed.    Assessment/Plan:  55yo male therapeutic on bivalirudin after rate change. Will continue gtt at current rate of 0.06 mg/kg/hr and confirm stable with am labs.   Vernard Gambles, PharmD, BCPS  09/28/2020,12:37 AM

## 2020-09-28 NOTE — Progress Notes (Signed)
ANTICOAGULATION CONSULT NOTE  Pharmacy Consult for Bivalirudin Indication: LV mural thrombus  Heparin Dosing Weight:  77.4 kg  Labs: Recent Labs    09/26/20 0320 09/26/20 1518 09/26/20 1551 09/27/20 0301 09/27/20 1611 09/27/20 2321 09/28/20 0301 09/28/20 1258 09/28/20 1640 09/28/20 2112  HGB 8.2* 7.6*  --  7.4*  --   --  7.1*  --   --   --   HCT 25.7* 24.6*  --  24.0*  --   --  23.9*  --   --   --   PLT 29* 24*  --  35*  --   --  55*  --   --   --   APTT  --   --   --   --  41*   < > 45* 45*  --  52*  CREATININE 2.03*  2.00*  --    < > 1.82* 1.69*  --  1.79*  --  1.92*  --   CKTOTAL 25,775*  --   --  52,778*  --   --  7,402*  --   --   --    < > = values in this interval not displayed.   Assessment: 55 yr old man presented with AMS. Pharmacy was originally consulted for heparin for NSTEMI, then continued for discovered LV thrombus; pt was on no anticoagulants PTA. Pharmacy now consulted for bivalirudin. -MRI brain on 4/20 with multiple acute infarcts and subacute ischemic changes.  -Patient was on IV heparin 4/16 until 4/26 (held intermittently for concerns of bleeding), stopped on 4/26 due to platelets dropping to 21. 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 SRA ordered 4/27, results anticipated 4/30-5/1 (although results may be delayed due to weekend?). Patient is off all heparin. -Patient also on CRRT, pausing for ~4 hours when completing plasmapheresis daily.  APTT= 52 and at goal   Goal of Therapy:  Goal aPTT 50-85 (no boluses) -- target lower end of goal ~50-70 given acute infarcts Monitor platelets by anticoagulation protocol: Yes   Plan:  - Continue Bivalirudin 0.085 mg/kg/hr (on CRRT) - aPTT q12h, daily CBC, monitor renal function/CRRT, liver function (BMP at least weekly) - Monitor weight for significant changes to adjust infusion rate  Harland German, PharmD Clinical Pharmacist **Pharmacist phone directory can now be found on [email or web address] ([email or web address]).  Listed under [facility].

## 2020-09-28 NOTE — Progress Notes (Signed)
  This morning, Dennis Howard is sitting up in a chair.  He is somewhat sleepy.  He is getting his tube feeds.  He is on the continuous dialysis.  There is a preliminary muscle biopsy report.  Again this is shows inflammation.  Final report is still pending.  This whole situation still seems like some kind of bad vasculitis or possibly is some collagen vascular issue.  I suppose it may be some type of viral syndrome.  Cultures are all negative.  His CK is dropping nicely.  Today, the CK is 7400.  The LDH is 396.  His platelet count is coming up.  His platelet count is 55,000.  His hemoglobin is 7.1.  White cell count 25.5.  He is on plasma exchange.  It is hard to say how much this is really helping.  I think the platelet count is probably getting better just because it is overall status is improving and I think his liver is probably getting better.  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  He is on Angiomax now.  This is for the thrombus in his heart.  His vital signs show temperature 98.1.  Pulse 119.  Blood pressure 128/80.  His lungs sound relatively clear bilaterally.  I do not hear any count of wheezes.  He has good air movement bilaterally.  Cardiac exam is tachycardic but regular.  Abdomen is soft.  Neurological exam shows some lethargy.  Again, it is totally unclear as to what triggered this incredibly fulminant event for Dennis Howard.  He is getting incredibly sophisticated care in the ICU.  I really do not see anything that we have to do different from a hematologic point of view.  His platelet count is improving.  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  He is on Angiomax for this right now.  I think he has another plasma exchange to go.  After that, I would just follow and see how everything trends with his blood work.  I just cannot put all this together and come up with a unifying hematologic diagnosis.  I am just happy that he seems to be improving.  Christin Bach, MD  Jeri Modena 17:14

## 2020-09-28 NOTE — Progress Notes (Signed)
ANTICOAGULATION CONSULT NOTE  Pharmacy Consult for Bivalirudin Indication: LV mural thrombus  Heparin Dosing Weight:  77.4 kg  Labs: Recent Labs    09/26/20 0320 09/26/20 1518 09/26/20 1551 09/27/20 0301 09/27/20 1611 09/27/20 2321 09/28/20 0301  HGB 8.2* 7.6*  --  7.4*  --   --  7.1*  HCT 25.7* 24.6*  --  24.0*  --   --  23.9*  PLT 29* 24*  --  35*  --   --  55*  APTT  --   --   --   --  41* 50* 45*  CREATININE 2.03*  2.00*  --    < > 1.82* 1.69*  --  1.79*  CKTOTAL 25,775*  --   --  14,656*  --   --  7,402*   < > = values in this interval not displayed.   Assessment: 55 yr old man presented with AMS. Pharmacy was originally consulted for heparin for NSTEMI, then continued for discovered LV thrombus; pt was on no anticoagulants PTA. Pharmacy now consulted for bivalirudin.  MRI brain on 4/20 with multiple acute infarcts and subacute ischemic changes. Per Neurology, will target low end of goal range.  Patient was on IV heparin 4/16 until 4/26 (held intermittently for concerns of bleeding), stopped on 4/26 due to platelets dropping to 21. 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 SRA ordered 4/27, results anticipated 4/30-5/1 (although results may be delayed due to weekend?). Patient is off all heparin.  Upper and lower US obtained, preliminary result without evidence of DVTs. Platelets improved today to 35. No s/sx bleeding reported. Given significant LV thrombus and higher risk for thrombosis with possible HIT, per CCM will start anticoagulation now. No specific platelet cut off to stop anticoagulation but will monitor closely. Prefer bivalirudin over argatroban due to liver dysfunction.  Patient also on CRRT, pausing for ~4 hours when completing plasmapheresis daily.  The patient's aPTT is SUBtherapeutic with am labs.  Goal of Therapy:  Goal aPTT 50-85 (no boluses) -- target lower end of goal ~50-70 given acute infarcts Monitor platelets by anticoagulation  protocol: Yes   Plan:  - Increase Bivalirudin to 0.07 mg/kg/hr (on CRRT) - Will recheck the next aPTT in 4 hours  - aPTT q12h, daily CBC, monitor renal function/CRRT, liver function (BMP at least weekly) - Monitor weight for significant changes to adjust infusion rate  Thank you for allowing pharmacy to be a part of this patient's care.  Jeanella Cara, PharmD, [facility] Clinical Pharmacist Please see AMION for all Pharmacists' Contact Phone Numbers 09/28/2020, 8:23 AM

## 2020-09-28 NOTE — Progress Notes (Signed)
NAME:  Dennis Macauley., MRN:  [id number], DOB:  06-26-1965, LOS: 15 ADMISSION DATE:  09/02/2020, CONSULTATION DATE: 09/10/2020 REFERRING MD: Wilkie Aye - EM CHIEF COMPLAINT:  Altered Mental Status  History of Present Illness:  55 yo male former smoker presented with altered mental status that progressed for one week prior to admission.  This was associated with lower leg swelling with blisters, weight gain, dyspnea, and cough.  Also had progressive muscle weakness.  Had recent outpt treatment for pneumonia with doxycycline.  Pertinent  Medical History  CKD III - sr cr 2013 1.6, HTN, Bronchitis, THC Use    Significant Hospital Events: Including procedures, antibiotic start and stop dates in addition to other pertinent events   . 4/15 Admitted to ICU. Treated with azithro, ceftriaxone, flagyl, unasyn. Tylenol, acetaminophen, ETOH, influenza, COVID, hepatitis, urine strep antigen, legionella, & UDS negative.  . 4/16 ECHO with global hypokinesis, anteroseptal wall and apex are akinetic, anterior wall severely hypokinetic, very large layered semi-mobile apical thrombus extending from the apex along the anteroseptal wall, LVEF ~15-20%, RV systolic function mild to moderately reduced.  tarted on dobuatmine drip. ANA negative. Blood cultures negative.  . 4/17 Unasyn stopped, cefepime initiated  . 4/18 Dobutamine at , net neg 1.7L, tolerating soft diet, no n/v . 4/19 Dobutamine increased to 7.64mcg . 4/20 MRI Brain >> 69mm acute infarction at the inferior cerebellum on left and 17mm acute infarction at the left parietooccipital junction. Subacute ischemic changes of the cortical subcortical brain. . 4/22 Thigh hematoma post biopsy . 4/23 Dobutamine at 6.73mcg, pt on heparin gtt, PPI gtt. Muscle biopsy pending.  . 4/24 dark colored bowel movement, hemoglobin stable . 4/25 CK 32K, CRRT started . 4/26 PLEX started yesterday. . 4/30:Stronger, tolerating orally  Interim History / Subjective:  4/28: On CRRT,  no acute events overnight. More alert today asking for po intake 4/29: Sitting at side of bed, interactive, answering questions with one-word  4/30: Tolerating orally, complain of some pain around his toes, worse on the right  Objective   Blood pressure 119/78, pulse (!) 110, temperature 98.1 F (36.7 C), temperature source Axillary, resp. rate 10, height 6' (1.829 m), weight 73.9 kg, SpO2 100 %. CVP:  [0 mmHg-8 mmHg] 0 mmHg      Intake/Output Summary (Last 24 hours) at 09/28/2020 1050 Last data filed at 09/28/2020 1013 Gross per 24 hour  Intake 3421.44 ml  Output 4350 ml  Net -928.56 ml   Filed Weights   09/27/20 0500 09/27/20 1900 09/28/20 0500  Weight: 74.8 kg 74.2 kg 73.9 kg    Examination:  General: Acutely ill-appearing neuro: Awake and interactive, follows commands  HEENT: Moist oral mucosa Cardiovascular: S1-S2 appreciated Lungs: Bibasal rales Abdomen: Soft, nontender Musculoskeletal: Bilateral lower extremity edema  skin:  Intact, MMM   Resolved Hospital Problem list   Encephalopathy  Assessment & Plan:   Diffuse muscle weakness with elevated CPK and aldolase -Muscle biopsy pending results -Was on PLEX-had 5 days -Continuing CRRT  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 -On bivalirudin day 2-platelets counts remain stable  Acute systolic congestive heart failure with cardiogenic shock Left ventricular ejection fraction of 15% and global hypokinesis -Remains on dobutamine and Levophed -Continue to monitor co-ox -Antihypertensives on hold -Appreciate heart failure service  Anemia, thrombocytopenia -Did receive steroids -PLEX -Thrombocytopenia appears to be recovering  Hyperglycemia -Continue SSI -On Levemir  Dysphagia -Tolerating oral intake  Acute embolic CVA -Will need MRA when more stable -Pending muscle biopsy results, [facility] neurology  Acute kidney injury on chronic kidney disease Rhabdomyolysis -Continue CRRT  Completed  antibiotics for pulmonary infiltrates  Elevated LFTs. Congestive hepatopathy -Continue to trend LFTs  Thrush -Treated   Better with physical activity out of bed to chair  Neurontin started at 100 daily for pain   Best practice (right click and "Reselect all SmartList Selections" daily)  Diet:  Tube Feed , Mechanical soft diet  pain/Anxiety/Delirium protocol (if indicated): No VAP protocol (if indicated): Not indicated DVT prophylaxis: Contraindicated GI prophylaxis: PPI BID Glucose control:  SSI Yes and Basal insulin Yes Central venous access:  Yes, and it is still needed Arterial line:  N/A Foley:  Yes, and it is still needed,  Mobility:  bed rest  PT consulted: N/A Last date of multidisciplinary goals of care discussion - ongoing Code Status:  full code Disposition: ICU  Labs:   CMP Latest Ref Rng & Units 09/28/2020 09/27/2020 09/27/2020  Glucose 70 - 99 mg/dL 725(D) 664(Q) 034(V)  BUN 6 - 20 mg/dL 42(V) 95(G) 38(V)  Creatinine 0.61 - 1.24 mg/dL 5.64(P) 3.29(J) 1.88(C)  Sodium 135 - 145 mmol/L 136 133(L) 135  Potassium 3.5 - 5.1 mmol/L 4.4 4.5 4.8  Chloride 98 - 111 mmol/L 98 98 98  CO2 22 - 32 mmol/L 28 27 28   Calcium 8.9 - 10.3 mg/dL 7.6(L) 7.2(L) 7.4(L)  Total Protein 6.5 - 8.1 g/dL - - -  Total Bilirubin 0.3 - 1.2 mg/dL - - -  Alkaline Phos 38 - 126 U/L - - -  AST 15 - 41 U/L - - -  ALT 0 - 44 U/L - - -    CBC Latest Ref Rng & Units 09/28/2020 09/27/2020 09/26/2020  WBC 4.0 - 10.5 K/uL 25.5(H) 26.1(H) 28.2(H)  Hemoglobin 13.0 - 17.0 g/dL 7.1(L) 7.4(L) 7.6(L)  Hematocrit 39.0 - 52.0 % 23.9(L) 24.0(L) 24.6(L)  Platelets 150 - 400 K/uL 55(L) 35(L) 24(LL)    ABG    Component Value Date/Time   PHART 7.540 (H) 09/19/2020 1650   PCO2ART 27.2 (L) 09/19/2020 1650   PO2ART 113 (H) 09/19/2020 1650   HCO3 23.3 09/19/2020 1650   TCO2 24 09/19/2020 1650   ACIDBASEDEF 1.8 09/14/2020 0031   O2SAT 50.6 09/28/2020 0301    CBG (last 3)  Recent Labs    09/27/20 2321  09/28/20 0309 09/28/20 0718  GLUCAP 237* 158* 134*   The patient is critically ill with multiple organ systems failure and requires high complexity decision making for assessment and support, frequent evaluation and titration of therapies, application of advanced monitoring technologies and extensive interpretation of multiple databases. Critical Care Time devoted to patient care services described in this note independent of APP/resident time (if applicable)  is 35 minutes.   09/30/20 MD [facility] Personal pager: [phone] If unanswered, please page CCM On-call: #[phone]

## 2020-09-28 NOTE — Progress Notes (Signed)
[facility] PROGRESS NOTE  Assessment/ Plan: Pt is a 55 y.o. yo male  with history of hypertension, CKD was admitted on 4/15 with altered mental status, found to have systolic CHF, respiratory failure required mechanical ventilation, rhabdomyolysis, now seen as a consultation for the evaluation of acute kidney injury.  #Acute kidney injury on CKD stage III multifactorial etiology including ischemic ATN due to cardiogenic/septic shock, severe CHF with poor renal perfusion, rhabdomyolysis.  He is on inotrope and pressors to maintain blood pressure.  Given declining urine output, low GFR, azotemia and very high CK level, CRRT started on 4/25.  I have discussed this with the patient's mother and ICU team.  CRRT tolerating well with UF around 50-100 cc/hour.  Currently on all 2K bath with acceptable potassium level. UF as tolerated by BP.  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 I have explained to patient's mother that he may not able to tolerate regular IHD.  Continue CRRT with same prescription.  #Acute metabolic encephalopathy, also has uremia:  CT scan with stroke.  #Acute systolic CHF with cardiogenic shock, LV thrombus: Seen by cardiologist.  Currently on dobutamine, angiomax.  Not much improvement.  #Pulmonary infiltrates, pneumonia: Completed antibiotics.  #Diffuse muscle weakness with elevated CPK, nontraumatic rhabdomyolysis status post muscle biopsy pending final results.  Currently on IV Solu-Medrol and plasma exchange by hematologist.  Platelet count 55 today.  #Multiorgan failure including congestive hepatopathy, renal failure, CHF, stroke.  Currently partial code.  Looks poor prognosis.  Subjective: Seen and examined.  No urine output.  Sitting on recliner and opening eyes and more alert today.  Tolerating CRRT well without any new event.  Another plasma exchange today. Objective Vital signs in last 24 hours: Vitals:   09/28/20 0830 09/28/20 0845  09/28/20 0900 09/28/20 0915  BP: 124/83 135/83 135/90 133/83  Pulse: (!) 111 (!) 111 (!) 111 (!) 111  Resp: 11 10 (!) 9 (!) 8  Temp:      TempSrc:      SpO2: 100% 100% 100% 100%  Weight:      Height:       Weight change: -0.6 kg  Intake/Output Summary (Last 24 hours) at 09/28/2020 0933 Last data filed at 09/28/2020 0900 Gross per 24 hour  Intake 3336.01 ml  Output 4059 ml  Net -722.99 ml       Labs: Basic Metabolic Panel: Recent Labs  Lab 09/27/20 0301 09/27/20 1611 09/28/20 0301  NA 135 133* 136  K 4.8 4.5 4.4  CL 98 98 98  CO2 GLUCOSE 147* 179* 166*  BUN 65* 62* 67*  CREATININE 1.82* 1.69* 1.79*  CALCIUM 7.4* 7.2* 7.6*  PHOS 3.7 3.7 3.7   Liver Function Tests: Recent Labs  Lab 09/24/20 0510 09/24/20 1601 09/25/20 0315 09/25/20 0759 09/26/20 0320 09/26/20 1551 09/27/20 0301 09/27/20 1611 09/28/20 0301  AST 496*  --  282*  --  271*  --   --   --   --   ALT 630*  --  288*  --  247*  --   --   --   --   ALKPHOS 188*  --  100  --  88  --   --   --   --   BILITOT 1.9*  --  1.7*  --  1.4*  --   --   --   --   PROT 5.4*  --  5.3*  --  5.6*  --   --   --   --  ALBUMIN 2.1*   < > 2.8*   < > 3.1*  3.0*   < > 2.9* 2.9* 3.0*   < > = values in this interval not displayed.   No results for input(s): LIPASE, AMYLASE in the last 168 hours. No results for input(s): AMMONIA in the last 168 hours. CBC: Recent Labs  Lab 09/25/20 0315 09/26/20 0320 09/26/20 1518 09/27/20 0301 09/28/20 0301  WBC 31.6* 32.7* 28.2* 26.1* 25.5*  NEUTROABS  --   --   --  23.7*  --   HGB 8.1* 8.2* 7.6* 7.4* 7.1*  HCT 26.2* 25.7* 24.6* 24.0* 23.9*  MCV 96.3 95.5 96.5 96.8 99.2  PLT 21* 29* 24* 35* 55*   Cardiac Enzymes: Recent Labs  Lab 09/24/20 0510 09/25/20 0759 09/26/20 0320 09/27/20 0301 09/28/20 0301  CKTOTAL 42,626* 33,136* 25,775* 14,656* 7,402*   CBG: Recent Labs  Lab 09/27/20 1609 09/27/20 1947 09/27/20 2321 09/28/20 0309 09/28/20 0718  GLUCAP  166* 233* 237* 158* 134*    Iron Studies: No results for input(s): IRON, TIBC, TRANSFERRIN, FERRITIN in the last 72 hours. Studies/Results: VAS [place] LOWER EXTREMITY VENOUS (DVT)  Result Date: 09/26/2020  Lower Venous DVT Study Patient Name:  Dennis Howard.  Date of Exam:   09/26/2020 Medical Rec #: [id number]         Accession #:    [id number] Date of Birth: 09/13/1965        Patient Gender: M Patient Age:   54Y Exam Location:  [facility] Procedure:      VAS [place] LOWER EXTREMITY VENOUS (DVT) Referring Phys: [id number] JESSICA MARSHALL --------------------------------------------------------------------------------  Indications: Edema. Other Indications: Cardiogenic and septic shock. Comparison Study: No prior Performing Technologist: Marilynne Halsted RDMS, RVT  Examination Guidelines: A complete evaluation includes B-mode imaging, spectral Doppler, color Doppler, and power Doppler as needed of all accessible portions of each vessel. Bilateral testing is considered an integral part of a complete examination. Limited examinations for reoccurring indications may be performed as noted. The reflux portion of the exam is performed with the patient in reverse Trendelenburg.  +---------+---------------+---------+-----------+----------+-------------------+ RIGHT    CompressibilityPhasicitySpontaneityPropertiesThrombus Aging      +---------+---------------+---------+-----------+----------+-------------------+ CFV      Full           Yes      Yes                                      +---------+---------------+---------+-----------+----------+-------------------+ SFJ      Full                                                             +---------+---------------+---------+-----------+----------+-------------------+ FV Prox  Full                                                             +---------+---------------+---------+-----------+----------+-------------------+ FV Mid   Full                                                              +---------+---------------+---------+-----------+----------+-------------------+  FV DistalFull                                                             +---------+---------------+---------+-----------+----------+-------------------+ PFV      Full                                                             +---------+---------------+---------+-----------+----------+-------------------+ POP      Full           Yes      Yes                                      +---------+---------------+---------+-----------+----------+-------------------+ PTV      Full                                                             +---------+---------------+---------+-----------+----------+-------------------+ PERO                                                  Not well visualized +---------+---------------+---------+-----------+----------+-------------------+   +---------+---------------+---------+-----------+----------+-------------------+ LEFT     CompressibilityPhasicitySpontaneityPropertiesThrombus Aging      +---------+---------------+---------+-----------+----------+-------------------+ CFV      Full           Yes      Yes                                      +---------+---------------+---------+-----------+----------+-------------------+ SFJ      Full                                                             +---------+---------------+---------+-----------+----------+-------------------+ FV Prox  Full                                                             +---------+---------------+---------+-----------+----------+-------------------+ FV Mid   Full                                                             +---------+---------------+---------+-----------+----------+-------------------+ FV DistalFull                                                              +---------+---------------+---------+-----------+----------+-------------------+  PFV      Full                                                             +---------+---------------+---------+-----------+----------+-------------------+ POP      Full                                                             +---------+---------------+---------+-----------+----------+-------------------+ PTV      Full                                                             +---------+---------------+---------+-----------+----------+-------------------+ PERO                                                  Not well visualized +---------+---------------+---------+-----------+----------+-------------------+     Summary: RIGHT: - There is no evidence of deep vein thrombosis in the lower extremity. However, portions of this examination were limited- see technologist comments above.  LEFT: - There is no evidence of deep vein thrombosis in the lower extremity. However, portions of this examination were limited- see technologist comments above.  *See table(s) above for measurements and observations.    Preliminary    VAS US UPPER EXTREMITY VENOUS DUPLEX  Result Date: 09/26/2020 UPPER VENOUS STUDY  Patient Name:  Domenica ReamerCarl Villamar Jr.  Date of Exam:   09/26/2020 Medical Rec #: [id number]         Accession #:    [id number] Date of Birth: Nov 08, 1965        Patient Gender: M Patient Age:   54Y [facility] Procedure:      VAS US UPPER EXTREMITY VENOUS DUPLEX Referring Phys: [id number] JESSICA MARSHALL --------------------------------------------------------------------------------  Indications: Edema Other Indications: Cardiogenic and septic shock. Limitations: Bandages. Performing Technologist: Marilynne Halstedita Sturdivant RDMS, RVT  Examination Guidelines: A complete evaluation includes B-mode imaging, spectral Doppler, color Doppler, and power Doppler as needed of all accessible portions of each vessel.  Bilateral testing is considered an integral part of a complete examination. Limited examinations for reoccurring indications may be performed as noted.  Right Findings: +----------+------------+---------+-----------+----------+--------------------+ RIGHT     CompressiblePhasicitySpontaneousProperties      Summary        +----------+------------+---------+-----------+----------+--------------------+ IJV                                                  tape & bandage in  place         +----------+------------+---------+-----------+----------+--------------------+ Subclavian               Yes       Yes                                   +----------+------------+---------+-----------+----------+--------------------+ Axillary      Full       Yes       Yes                                   +----------+------------+---------+-----------+----------+--------------------+ Brachial      Full                                                       +----------+------------+---------+-----------+----------+--------------------+ Radial        Full                                       prox only       +----------+------------+---------+-----------+----------+--------------------+ Ulnar         Full                                       prox only       +----------+------------+---------+-----------+----------+--------------------+ Cephalic      Full                                                       +----------+------------+---------+-----------+----------+--------------------+ Basilic       Full                                                       +----------+------------+---------+-----------+----------+--------------------+ Right neck line in place - IJV not visualized. Right forearm segments visualized only.  Left Findings: +----------+------------+---------+-----------+----------+--------------+  LEFT      CompressiblePhasicitySpontaneousProperties   Summary     +----------+------------+---------+-----------+----------+--------------+ IJV                                                 Not visualized +----------+------------+---------+-----------+----------+--------------+ Subclavian    Full       Yes       Yes                             +----------+------------+---------+-----------+----------+--------------+ Axillary      Full       Yes       Yes                             +----------+------------+---------+-----------+----------+--------------+  Brachial    Partial                                                +----------+------------+---------+-----------+----------+--------------+ Radial        Full                                                 +----------+------------+---------+-----------+----------+--------------+ Ulnar         Full                                                 +----------+------------+---------+-----------+----------+--------------+ Cephalic                                            Not visualized +----------+------------+---------+-----------+----------+--------------+ Basilic       Full                                                 +----------+------------+---------+-----------+----------+--------------+  Summary: No evidence of deep vein or superficial vein thrombosis involving the right and left upper extremities in the veins that were visualized. See tech's note.  *See table(s) above for measurements and observations.     Preliminary     Medications: Infusions: . sodium chloride 10 mL/hr at 09/28/20 0900  . anticoagulant sodium citrate    . bivalirudin (ANGIOMAX) infusion 0.5 mg/mL (Non-ACS indications) 0.07 mg/kg/hr (09/28/20 0900)  . citrate dextrose    . DOBUTamine 6.5 mcg/kg/min (09/28/20 0900)  . feeding supplement (PIVOT 1.5 CAL) 1,000 mL (09/27/20 2200)  . methylPREDNISolone (SOLU-MEDROL)  injection Stopped (09/28/20 0732)  . norepinephrine (LEVOPHED) Adult infusion 15 mcg/min (09/28/20 0900)  . prismasol BGK 2/2.5 dialysis solution 1,500 mL/hr at 09/28/20 0929  . prismasol BGK 2/2.5 replacement solution 500 mL/hr at 09/28/20 0915  . prismasol BGK 2/2.5 replacement solution 300 mL/hr at 09/27/20 2310    Scheduled Medications: . calcium gluconate  2 g Intravenous Once  . capsaicin   Topical BID  . Chlorhexidine Gluconate Cloth  6 each Topical Daily  . insulin aspart  0-20 Units Subcutaneous Q4H  . insulin aspart  4 Units Subcutaneous Q4H  . insulin detemir  15 Units Subcutaneous BID  . lidocaine   Topical BID  . magic mouthwash  10 mL Oral TID  . mouth rinse  15 mL Mouth Rinse BID  . multivitamin with minerals  1 tablet Per Tube Daily  . pantoprazole  40 mg Intravenous Q12H  . sodium chloride flush  10-40 mL Intracatheter Q12H  . thiamine injection  100 mg Intravenous Daily    have reviewed scheduled and prn medications.  Physical Exam: General: Critically ill looking male, sitting on recliner, able to open eyes and looks more alert. Heart:RRR, s1s2 nl Lungs: Clear anteriorly, no increased work of breathing Abdomen:soft, Non-tender, non-distended Extremities: LE edema present not much change Dialysis Access: L  IJ temporary HD catheter in place  Dennis Howard 09/28/2020,9:33 AM  LOS: 15 days

## 2020-09-29 DIAGNOSIS — N170 Acute kidney failure with tubular necrosis: Secondary | ICD-10-CM | POA: Diagnosis not present

## 2020-09-29 DIAGNOSIS — R570 Cardiogenic shock: Secondary | ICD-10-CM | POA: Diagnosis not present

## 2020-09-29 DIAGNOSIS — D62 Acute posthemorrhagic anemia: Secondary | ICD-10-CM | POA: Diagnosis not present

## 2020-09-29 DIAGNOSIS — G9340 Encephalopathy, unspecified: Secondary | ICD-10-CM | POA: Diagnosis not present

## 2020-09-29 LAB — THERAPEUTIC PLASMA EXCHANGE (BLOOD BANK)
Plasma Exchange: 3790
Plasma volume needed: 3790
Unit division: 0
Unit division: 0
Unit division: 0
Unit division: 0
Unit division: 0
Unit division: 0
Unit division: 0
Unit division: 0
Unit division: 0
Unit division: 0
Unit division: 0
Unit division: 0

## 2020-09-29 LAB — GLUCOSE, CAPILLARY
Glucose-Capillary: 121 mg/dL — ABNORMAL HIGH (ref 70–99)
Glucose-Capillary: 150 mg/dL — ABNORMAL HIGH (ref 70–99)
Glucose-Capillary: 156 mg/dL — ABNORMAL HIGH (ref 70–99)
Glucose-Capillary: 166 mg/dL — ABNORMAL HIGH (ref 70–99)
Glucose-Capillary: 167 mg/dL — ABNORMAL HIGH (ref 70–99)
Glucose-Capillary: 174 mg/dL — ABNORMAL HIGH (ref 70–99)

## 2020-09-29 LAB — CBC
HCT: 23.1 % — ABNORMAL LOW (ref 39.0–52.0)
Hemoglobin: 7.1 g/dL — ABNORMAL LOW (ref 13.0–17.0)
MCH: 30 pg (ref 26.0–34.0)
MCHC: 30.7 g/dL (ref 30.0–36.0)
MCV: 97.5 fL (ref 80.0–100.0)
Platelets: 82 10*3/uL — ABNORMAL LOW (ref 150–400)
RBC: 2.37 MIL/uL — ABNORMAL LOW (ref 4.22–5.81)
RDW: 17.1 % — ABNORMAL HIGH (ref 11.5–15.5)
WBC: 26.2 10*3/uL — ABNORMAL HIGH (ref 4.0–10.5)
nRBC: 0.8 % — ABNORMAL HIGH (ref 0.0–0.2)

## 2020-09-29 LAB — RENAL FUNCTION PANEL
Albumin: 3 g/dL — ABNORMAL LOW (ref 3.5–5.0)
Albumin: 3.1 g/dL — ABNORMAL LOW (ref 3.5–5.0)
Anion gap: 10 (ref 5–15)
Anion gap: 11 (ref 5–15)
BUN: 63 mg/dL — ABNORMAL HIGH (ref 6–20)
BUN: 58 mg/dL — ABNORMAL HIGH (ref 6–20)
CO2: 28 mmol/L (ref 22–32)
CO2: 25 mmol/L (ref 22–32)
Calcium: 7.7 mg/dL — ABNORMAL LOW (ref 8.9–10.3)
Calcium: 7.9 mg/dL — ABNORMAL LOW (ref 8.9–10.3)
Chloride: 98 mmol/L (ref 98–111)
Chloride: 98 mmol/L (ref 98–111)
Creatinine, Ser: 1.65 mg/dL — ABNORMAL HIGH (ref 0.61–1.24)
Creatinine, Ser: 1.54 mg/dL — ABNORMAL HIGH (ref 0.61–1.24)
GFR, Estimated: 49 mL/min — ABNORMAL LOW (ref >60)
GFR, Estimated: 54 mL/min — ABNORMAL LOW (ref >60)
Glucose, Bld: 172 mg/dL — ABNORMAL HIGH (ref 70–99)
Glucose, Bld: 213 mg/dL — ABNORMAL HIGH (ref 70–99)
Phosphorus: 3.3 mg/dL (ref 2.5–4.6)
Phosphorus: 3.2 mg/dL (ref 2.5–4.6)
Potassium: 4.2 mmol/L (ref 3.5–5.1)
Potassium: 3.7 mmol/L (ref 3.5–5.1)
Sodium: 136 mmol/L (ref 135–145)
Sodium: 134 mmol/L — ABNORMAL LOW (ref 135–145)

## 2020-09-29 LAB — HEPATIC FUNCTION PANEL
ALT: 175 U/L — ABNORMAL HIGH (ref 0–44)
AST: 105 U/L — ABNORMAL HIGH (ref 15–41)
Albumin: 3.1 g/dL — ABNORMAL LOW (ref 3.5–5.0)
Alkaline Phosphatase: 70 U/L (ref 38–126)
Bilirubin, Direct: 0.3 mg/dL — ABNORMAL HIGH (ref 0.0–0.2)
Indirect Bilirubin: 0.7 mg/dL (ref 0.3–0.9)
Total Bilirubin: 1 mg/dL (ref 0.3–1.2)
Total Protein: 5.7 g/dL — ABNORMAL LOW (ref 6.5–8.1)

## 2020-09-29 LAB — COOXEMETRY PANEL
Carboxyhemoglobin: 1.7 % — ABNORMAL HIGH (ref 0.5–1.5)
Methemoglobin: 1.2 % (ref 0.0–1.5)
O2 Saturation: 55.2 %
Total hemoglobin: 7.2 g/dL — ABNORMAL LOW (ref 12.0–16.0)

## 2020-09-29 LAB — LACTATE DEHYDROGENASE: LDH: 372 U/L — ABNORMAL HIGH (ref 98–192)

## 2020-09-29 LAB — APTT
aPTT: 42 seconds — ABNORMAL HIGH (ref 24–36)
aPTT: 48 seconds — ABNORMAL HIGH (ref 24–36)
aPTT: 48 s — ABNORMAL HIGH (ref 24–36)

## 2020-09-29 LAB — MAGNESIUM: Magnesium: 2.6 mg/dL — ABNORMAL HIGH (ref 1.7–2.4)

## 2020-09-29 LAB — CK: Total CK: 5977 U/L — ABNORMAL HIGH (ref 49–397)

## 2020-09-29 MED ORDER — ALTEPLASE 2 MG IJ SOLR
2.0000 mg | Freq: Once | INTRAMUSCULAR | Status: AC
  Administered 2020-09-29: 2 mg
  Filled 2020-09-29: qty 2

## 2020-09-29 NOTE — Progress Notes (Signed)
ANTICOAGULATION CONSULT NOTE  Pharmacy Consult for Bivalirudin Indication: LV mural thrombus  Heparin Dosing Weight:  77.4 kg  Labs: Recent Labs    09/27/20 0301 09/27/20 1611 09/28/20 0301 09/28/20 1258 09/28/20 1640 09/28/20 2112 09/29/20 0313 09/29/20 1031 09/29/20 1539  HGB 7.4*  --  7.1*  --   --   --  7.1*  --   --   HCT 24.0*  --  23.9*  --   --   --  23.1*  --   --   PLT 35*  --  55*  --   --   --  82*  --   --   APTT  --    < > 45*   < >  --    < > 42* 48* 48*  CREATININE 1.82*   < > 1.79*  --  1.92*  --  1.65*  --  1.54*  CKTOTAL 14,656*  --  7,402*  --   --   --   --  5,977*  --    < > = values in this interval not displayed.   Assessment: 55 yr old man presented with AMS. Pharmacy was originally consulted for heparin for NSTEMI, then continued for discovered LV thrombus; pt was on no anticoagulants PTA. Pharmacy now consulted for bivalirudin. -MRI brain on 4/20 with multiple acute infarcts and subacute ischemic changes.  -Patient was on IV heparin 4/16 until 4/26 (held intermittently for concerns of bleeding), stopped on 4/26 due to platelets dropping to 21. 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 SRA ordered 4/27, results anticipated 4/30-5/1 (although results may be delayed due to weekend?). Patient is off all heparin. -Patient also on CRRT, pausing for ~4 hours when completing plasmapheresis daily.  APTT= 48 and slightly below goal on bivalirudin 0.1 mg/kg/hr    Goal of Therapy:  Goal aPTT 50-85 (no boluses) -- target lower end of goal ~50-70 given acute infarcts Monitor platelets by anticoagulation protocol: Yes   Plan:  - Increase bivalirudin to  0.11 mg/kg/hr (on CRRT) -recheck aPTT in the morning  Harland German, PharmD Clinical Pharmacist **Pharmacist phone directory can now be found on [email or web address] ([email or web address]).  Listed under [facility].

## 2020-09-29 NOTE — Progress Notes (Signed)
[facility] NEPHROLOGY PROGRESS NOTE  Assessment/ Plan: Pt is a 55 y.o. yo male  with history of hypertension, CKD was admitted on 4/15 with altered mental status, found to have systolic CHF, respiratory failure required mechanical ventilation, rhabdomyolysis, now seen as a consultation for the evaluation of acute kidney injury.  #Acute kidney injury on CKD stage III multifactorial etiology including ischemic ATN due to cardiogenic/septic shock, severe CHF with poor renal perfusion, rhabdomyolysis.  He is on inotrope and pressors to maintain blood pressure.  Given declining urine output, low GFR, azotemia and very high CK level, CRRT started on 4/25.  I have discussed this with the patient's mother and ICU team.  CRRT tolerating well with UF around 50-100 cc/hour.  Currently on all 2K bath with acceptable potassium level. UF as tolerated by BP.  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 I have explained to patient's mother that he may not able to tolerate regular IHD.  Continue current CRRT prescription, tolerating well.  #Acute metabolic encephalopathy, also has uremia:  CT scan with stroke.  The mental status is gradually improving.  #Acute systolic CHF with cardiogenic shock, LV thrombus: Seen by cardiologist.  Currently on dobutamine, angiomax.  Not much improvement.  #Pulmonary infiltrates, pneumonia: Completed antibiotics.  #Diffuse muscle weakness with elevated CPK, nontraumatic rhabdomyolysis status post muscle biopsy pending final results.  Currently on IV Solu-Medrol and plasma exchange by hematologist.  Platelet count 55 today.  #Multiorgan failure including congestive hepatopathy, renal failure, CHF, stroke.  Currently partial code.  Looks poor prognosis.  Subjective: Seen and examined.  No urine output.  He is sitting on chair and trying to have breakfast with the help of nurse.  Completed plasma exchange yesterday and running CRRT well without any  problem. Objective Vital signs in last 24 hours: Vitals:   09/29/20 0815 09/29/20 0830 09/29/20 0845 09/29/20 0900  BP: (!) 122/109 120/81 120/77 (!) 90/56  Pulse: (!) 111 (!) 113 (!) 114 (!) 113  Resp: 10 10 13  (!) 9  Temp:      TempSrc:      SpO2: 100% 100% 100% 100%  Weight:      Height:       Weight change: -2.3 kg  Intake/Output Summary (Last 24 hours) at 09/29/2020 0912 Last data filed at 09/29/2020 0800 Gross per 24 hour  Intake 2972.34 ml  Output 5191 ml  Net -2218.66 ml       Labs: Basic Metabolic Panel: Recent Labs  Lab 09/28/20 0301 09/28/20 1640 09/29/20 0313  NA 136 137 136  K 4.4 4.3 4.2  CL 98 96* 98  CO2 28 29 28   GLUCOSE 166* 236* 172*  BUN 67* 69* 63*  CREATININE 1.79* 1.92* 1.65*  CALCIUM 7.6* 8.1* 7.7*  PHOS 3.7 4.7* 3.3   Liver Function Tests: Recent Labs  Lab 09/24/20 0510 09/24/20 1601 09/25/20 0315 09/25/20 0759 09/26/20 0320 09/26/20 1551 09/28/20 0301 09/28/20 1640 09/29/20 0313  AST 496*  --  282*  --  271*  --   --   --   --   ALT 630*  --  288*  --  247*  --   --   --   --   ALKPHOS 188*  --  100  --  88  --   --   --   --   BILITOT 1.9*  --  1.7*  --  1.4*  --   --   --   --   PROT 5.4*  --  5.3*  --  5.6*  --   --   --   --   ALBUMIN 2.1*   < > 2.8*   < > 3.1*  3.0*   < > 3.0* 2.8* 3.0*   < > = values in this interval not displayed.   No results for input(s): LIPASE, AMYLASE in the last 168 hours. No results for input(s): AMMONIA in the last 168 hours. CBC: Recent Labs  Lab 09/26/20 0320 09/26/20 1518 09/27/20 0301 09/28/20 0301 09/29/20 0313  WBC 32.7* 28.2* 26.1* 25.5* 26.2*  NEUTROABS  --   --  23.7*  --   --   HGB 8.2* 7.6* 7.4* 7.1* 7.1*  HCT 25.7* 24.6* 24.0* 23.9* 23.1*  MCV 95.5 96.5 96.8 99.2 97.5  PLT 29* 24* 35* 55* 82*   Cardiac Enzymes: Recent Labs  Lab 09/24/20 0510 09/25/20 0759 09/26/20 0320 09/27/20 0301 09/28/20 0301  CKTOTAL 42,626* 33,136* 25,775* 14,656* 7,402*   CBG: Recent  Labs  Lab 09/28/20 1500 09/28/20 1918 09/28/20 2307 09/29/20 0305 09/29/20 0746  GLUCAP 170* 147* 184* 156* 121*    Iron Studies: No results for input(s): IRON, TIBC, TRANSFERRIN, FERRITIN in the last 72 hours. Studies/Results: No results found.  Medications: Infusions: . sodium chloride 10 mL/hr at 09/29/20 0800  . anticoagulant sodium citrate    . bivalirudin (ANGIOMAX) infusion 0.5 mg/mL (Non-ACS indications) 0.085 mg/kg/hr (09/29/20 0800)  . citrate dextrose    . DOBUTamine 6.5 mcg/kg/min (09/29/20 0800)  . feeding supplement (PIVOT 1.5 CAL) 1,000 mL (09/29/20 0556)  . methylPREDNISolone (SOLU-MEDROL) injection Stopped (09/29/20 0735)  . norepinephrine (LEVOPHED) Adult infusion 15 mcg/min (09/29/20 0800)  . prismasol BGK 2/2.5 dialysis solution 1,500 mL/hr at 09/29/20 6568  . prismasol BGK 2/2.5 replacement solution 500 mL/hr at 09/28/20 2347  . prismasol BGK 2/2.5 replacement solution 300 mL/hr at 09/28/20 2031    Scheduled Medications: . capsaicin   Topical BID  . Chlorhexidine Gluconate Cloth  6 each Topical Daily  . gabapentin  100 mg Oral Daily  . insulin aspart  0-20 Units Subcutaneous Q4H  . insulin aspart  4 Units Subcutaneous Q4H  . insulin detemir  15 Units Subcutaneous BID  . lidocaine   Topical BID  . magic mouthwash  10 mL Oral TID  . mouth rinse  15 mL Mouth Rinse BID  . multivitamin with minerals  1 tablet Per Tube Daily  . pantoprazole  40 mg Intravenous Q12H  . sodium chloride flush  10-40 mL Intracatheter Q12H  . thiamine injection  100 mg Intravenous Daily    have reviewed scheduled and prn medications.  Physical Exam: General: Critically ill looking male, sitting on recliner, looks more alert awake.  Heart:RRR, s1s2 nl Lungs: Clear anteriorly, no increased work of breathing Abdomen:soft, Non-tender, non-distended Extremities: LE edema mildly improved. Dialysis Access: L IJ temporary HD catheter in place  Am[facility]e [facility] 09/29/2020,9:12  AM  LOS: 16 days

## 2020-09-29 NOTE — Progress Notes (Signed)
[name] is making slow but steady progress.  His platelet count is now 85,000.  I think we can stop the plasma exchange.  This really hard to know how much that really was helping.  Again, I have to suspect that we are looking at his liver just improving.  There are no LFTs back.  There is no CK.  There is no LDH.  He is still getting tube feeds.  He is still on pressors and dobutamine.  He has had a most the day yesterday.  He continues on bivalirudin.  He is doing well with this.  There is no bleeding.  He is a little more alert.  He says a couple words.  His renal function seems to be doing pretty well.  BUN is 63 creatinine 1.65.  Still has the leukocytosis.  His hemoglobin is 7.1.  His vital signs show temperature of 98.2.  Pulse 114.  Blood pressure 109/84.  His lungs are pretty clear bilaterally.  He has good air movement bilaterally.  Cardiac exam tachycardic regular.  I hear no murmurs.  Abdomen is soft.  Bowel sounds are decreased.  There is no fluid wave.  Looks like what ever has occurred with Mr. Mirsky, things are improving.  Hopefully, he will be able to have the feeding tube taken out if he is able to taking good nutrition.  Again we will stop the plasma exchange.  Just not sure how much this is really contributing to his improvement.  He is on the bivalirudin.  He will maintain this until he can take oral and then he can be switched over to one of the oral anticoagulants.  It is obvious he is getting outstanding care from all the staff in the ICU.  Christin Bach, MD  Psalm 27:14

## 2020-09-29 NOTE — Progress Notes (Signed)
NAME:  Dennis Howard., MRN:  [id number], DOB:  [birth year], LOS: 16 ADMISSION DATE:  10/09/2020, CONSULTATION DATE: 10-09-2020 REFERRING MD: Wilkie Aye - EM CHIEF COMPLAINT:  Altered Mental Status  History of Present Illness:  55 yo male former smoker presented with altered mental status that progressed for one week prior to admission.  This was associated with lower leg swelling with blisters, weight gain, dyspnea, and cough.  Also had progressive muscle weakness.  Had recent outpt treatment for pneumonia with doxycycline.  Pertinent  Medical History  CKD III - sr cr 2013 1.6, HTN, Bronchitis, THC Use    Significant Hospital Events: Including procedures, antibiotic start and stop dates in addition to other pertinent events   . 4/15 Admitted to ICU. Treated with azithro, ceftriaxone, flagyl, unasyn. Tylenol, acetaminophen, ETOH, influenza, COVID, hepatitis, urine strep antigen, legionella, & UDS negative.  . 4/16 ECHO with global hypokinesis, anteroseptal wall and apex are akinetic, anterior wall severely hypokinetic, very large layered semi-mobile apical thrombus extending from the apex along the anteroseptal wall, LVEF ~15-20%, RV systolic function mild to moderately reduced.  tarted on dobuatmine drip. ANA negative. Blood cultures negative.  . 4/17 Unasyn stopped, cefepime initiated  . 4/18 Dobutamine at , net neg 1.7L, tolerating soft diet, no n/v . 4/19 Dobutamine increased to 7.3mcg . 4/20 MRI Brain >> 71mm acute infarction at the inferior cerebellum on left and 44mm acute infarction at the left parietooccipital junction. Subacute ischemic changes of the cortical subcortical brain. . 4/22 Thigh hematoma post biopsy . 4/23 Dobutamine at 6.8mcg, pt on heparin gtt, PPI gtt. Muscle biopsy pending.  . 4/24 dark colored bowel movement, hemoglobin stable . 4/25 CK 32K, CRRT started . 4/26 PLEX started yesterday. . 4/30:Stronger, tolerating orally . 5/1: PLEX completed 4/30  Interim History /  Subjective:  4/28: On CRRT, no acute events overnight. More alert today asking for po intake 4/29: Sitting at side of bed, interactive, answering questions with one-word  4/30: Tolerating orally, complain of some pain around his toes, worse on the right  Objective   Blood pressure 112/74, pulse (!) 109, temperature 97.7 F (36.5 C), temperature source Axillary, resp. rate 10, height 6' (1.829 m), weight 71.9 kg, SpO2 100 %. CVP:  [0 mmHg-5 mmHg] 4 mmHg      Intake/Output Summary (Last 24 hours) at 09/29/2020 1502 Last data filed at 09/29/2020 1400 Gross per 24 hour  Intake 3352.33 ml  Output 5142 ml  Net -1789.67 ml   Filed Weights   09/27/20 1900 09/28/20 0500 09/29/20 0500  Weight: 74.2 kg 73.9 kg 71.9 kg    Examination:  General: Acutely ill-appearing, still very frail neuro: Awake and interactive, follows commands HEENT: Moist oral mucosa Cardiovascular: S1-S2 appreciated, intermittent tachycardia Lungs: Decreased air entry at the bases bilaterally Abdomen: Soft, nontender Musculoskeletal: Bilateral lower extremity edema  skin:  Intact, MMM   Resolved Hospital Problem list   Encephalopathy  Assessment & Plan:   Diffuse muscle weakness with elevated CPK and aldolase -Muscle biopsy with pending results -Post 5 days of Plex treatments -Continues on CRRT  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 Congestive  hepatopathy -Continue to trend LFTs  Thrush -Treated   Neurontin started for pain-low-dose  Best practice (right click and "Reselect all SmartList Selections" daily)  Diet:  Tube Feed , Mechanical soft diet  pain/Anxiety/Delirium protocol (if indicated): No VAP protocol (if indicated): Not indicated DVT prophylaxis: Contraindicated GI prophylaxis: PPI BID Glucose control:  SSI Yes and Basal insulin Yes Central venous access:  Yes, and it is still needed Arterial line:  N/A Foley:  Yes, and it is still needed,  Mobility:  bed rest  PT consulted: N/A Last date of multidisciplinary goals of care discussion - ongoing Code Status:  full code Disposition: ICU  Labs:   CMP Latest Ref Rng & Units 09/29/2020 09/28/2020 09/28/2020  Glucose 70 - 99 mg/dL 387(F) 643(P) 295(J)  BUN 6 - 20 mg/dL 88(C) 16(S) 06(T)  Creatinine 0.61 - 1.24 mg/dL 0.16(W) 1.09(N) 2.35(T)  Sodium 135 - 145 mmol/L 136 137 136  Potassium 3.5 - 5.1 mmol/L 4.2 4.3 4.4  Chloride 98 - 111 mmol/L 98 96(L) 98  CO2 22 - 32 mmol/L 28 29 28   Calcium 8.9 - 10.3 mg/dL 7.7(L) 8.1(L) 7.6(L)  Total Protein 6.5 - 8.1 g/dL ) - -  Total Bilirubin 0.3 - 1.2 mg/dL 1.0 - -  Alkaline Phos 38 - 126 U/L 70 - -  AST 15 - 41 U/L 105(H) - -  ALT 0 - 44 U/L 175(H) - -    CBC Latest Ref Rng & Units 09/29/2020 09/28/2020 09/27/2020  WBC 4.0 - 10.5 K/uL 26.2(H) 25.5(H) 26.1(H)  Hemoglobin 13.0 - 17.0 g/dL 7.1(L) 7.1(L) 7.4(L)  Hematocrit 39.0 - 52.0 % 23.1(L) 23.9(L) 24.0(L)  Platelets 150 - 400 K/uL 82(L) 55(L) 35(L)    ABG    Component Value Date/Time   PHART 7.540 (H) 09/19/2020 1650   PCO2ART 27.2 (L) 09/19/2020 1650   PO2ART 113 (H) 09/19/2020 1650   HCO3 23.3 09/19/2020 1650   TCO2 24 09/19/2020 1650   ACIDBASEDEF 1.8 09/14/2020 0031   O2SAT 55.2 09/29/2020 0313    CBG (last 3)  Recent Labs    09/29/20 0305 09/29/20 0746 09/29/20 1123  GLUCAP 156* 121* 174*   The patient is critically ill with multiple organ  systems failure and requires high complexity decision making for assessment and support, frequent evaluation and titration of therapies, application of advanced monitoring technologies and extensive interpretation of multiple databases. Critical Care Time devoted to patient care services described in this note independent of APP/resident time (if applicable)  is 32 minutes.   11/29/20 MD [facility] Personal pager: [phone] If unanswered, please page CCM On-call: #[phone]

## 2020-09-29 NOTE — Progress Notes (Signed)
ANTICOAGULATION CONSULT NOTE  Pharmacy Consult for Bivalirudin Indication: LV mural thrombus  Heparin Dosing Weight:  77.4 kg  Labs: Recent Labs    09/27/20 0301 09/27/20 1611 09/28/20 0301 09/28/20 1258 09/28/20 1640 09/28/20 2112 09/29/20 0313  HGB 7.4*  --  7.1*  --   --   --  7.1*  HCT 24.0*  --  23.9*  --   --   --  23.1*  PLT 35*  --  55*  --   --   --  82*  APTT  --    < > 45* 45*  --  52* 42*  CREATININE 1.82*   < > 1.79*  --  1.92*  --  1.65*  CKTOTAL 14,656*  --  7,402*  --   --   --   --    < > = values in this interval not displayed.   Assessment: 55 yr old man presented with AMS. Pharmacy was originally consulted for heparin for NSTEMI, then continued for discovered LV thrombus; pt was on no anticoagulants PTA. Pharmacy now consulted for bivalirudin. -MRI brain on 4/20 with multiple acute infarcts and subacute ischemic changes.  -Patient was on IV heparin 4/16 until 4/26 (held intermittently for concerns of bleeding), stopped on 4/26 due to platelets dropping to 21. 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 SRA ordered 4/27, results anticipated 4/30-5/1 (although results may be delayed due to weekend?). Patient is off all heparin. -Patient also on CRRT, pausing for ~4 hours when completing plasmapheresis daily.  APTT= 42 and at goal   Goal of Therapy:  Goal aPTT 50-85 (no boluses) -- target lower end of goal ~50-70 given acute infarcts Monitor platelets by anticoagulation protocol: Yes   Plan:  - Continue Bivalirudin 0.1 mg/kg/hr (on CRRT) -aPTT in 4 hours - aPTT q12h, daily CBC, monitor renal function/CRRT, liver function (BMP at least weekly) - Monitor weight for significant changes to adjust infusion rate  Jeanella Cara, PharmD, [facility] Clinical Pharmacist Please see AMION for all Pharmacists' Contact Phone Numbers 09/29/2020, 8:50 AM

## 2020-09-29 NOTE — Progress Notes (Signed)
Advanced Heart Failure Rounding Note  PCP-Cardiologist: No primary care provider on file.    Subjective   55 y/o male w/ no prior cardiac history, admitted for mixed cardiogenic + septic shock in setting of PNA + hepatic encephalopathy.   Remains on DBA 6.5 mcg/kg/min + NE 15 mcg. Co-ox 55% Finished PLEX 5/5 on 4/29  Remains on HD   Resting comfortably now but per primary team more alert and less lethargic over past 24 hours. Toes hurt. Tolerating bival   ECHO LVEF 15% with severe global HK with regional variability + LV clot RV moderately down. Initial Co-ox 48%. Started on DBA.   Objective:   Weight Range: 71.9 kg Body mass index is 21.5 kg/m.   Vital Signs:   Temp:  [97.7 F (36.5 C)-98.7 F (37.1 C)] 97.8 F (36.6 C) (05/01 1522) Pulse Rate:  [60-126] 108 (05/01 1700) Resp:  [8-25] 11 (05/01 1700) BP: (90-152)/(56-119) 98/70 (05/01 1700) SpO2:  [97 %-100 %] 100 % (05/01 1700) Weight:  [71.9 kg] 71.9 kg (05/01 0500) Last BM Date: 09/28/20  Weight change: Filed Weights   09/27/20 1900 09/28/20 0500 09/29/20 0500  Weight: 74.2 kg 73.9 kg 71.9 kg    Intake/Output:   Intake/Output Summary (Last 24 hours) at 09/29/2020 1709 Last data filed at 09/29/2020 1700 Gross per 24 hour  Intake 3344.43 ml  Output 5772 ml  Net -2427.57 ml      Physical Exam   General: Sitting in chair. Very weak No resp difficulty HEENT: normal Neck: supple. +HD cath  Carotids 2+ bilat; no bruits. No lymphadenopathy or thryomegaly appreciated. Cor: PMI nondisplaced. Tachy regular. No rubs, gallops or murmurs. Lungs: clear Abdomen: soft, nontender, nondistended. No hepatosplenomegaly. No bruits or masses. Good bowel sounds. Extremities: no cyanosis, clubbing, rash, 1-2+ edema  Cyanotic toes  Neuro:  Will awaken and follow commands. Diffusely weak   Telemetry   Sinus tach 110-120 with periods of atrial tach Personally reviewed  Labs    CBC Recent Labs    09/27/20 0301  09/28/20 0301 09/29/20 0313  WBC 26.1* 25.5* 26.2*  NEUTROABS 23.7*  --   --   HGB 7.4* 7.1* 7.1*  HCT 24.0* 23.9* 23.1*  MCV 96.8 99.2 97.5  PLT 35* 55* 82*   Basic Metabolic Panel Recent Labs    44/81/85 0301 09/28/20 1640 09/29/20 0313 09/29/20 1539  NA 136   < > 136 134*  K 4.4   < > 4.2 3.7  CL 98   < > 98 98  CO2 28   < > 28 25  GLUCOSE 166*   < > 172* 213*  BUN 67*   < > 63* 58*  CREATININE 1.79*   < > 1.65* 1.54*  CALCIUM 7.6*   < > 7.7* 7.9*  MG 2.6*  --  2.6*  --   PHOS 3.7   < > 3.3 3.2   < > = values in this interval not displayed.   Liver Function Tests Recent Labs    09/29/20 1031 09/29/20 1539  AST 105*  --   ALT 175*  --   ALKPHOS 70  --   BILITOT 1.0  --   PROT 5.7*  --   ALBUMIN 3.1* 3.1*   No results for input(s): LIPASE, AMYLASE in the last 72 hours. Cardiac Enzymes Recent Labs    09/27/20 0301 09/28/20 0301 09/29/20 1031  CKTOTAL 14,656* 7,402* 5,977*    BNP: BNP (last 3 results) Recent Labs  09/15/2020 2114 09/14/20 0031 09/15/20 0321  BNP 1,447.9* 1,523.7* 743.8*    ProBNP (last 3 results) No results for input(s): PROBNP in the last 8760 hours.   D-Dimer No results for input(s): DDIMER in the last 72 hours. Hemoglobin A1C No results for input(s): HGBA1C in the last 72 hours. Fasting Lipid Panel No results for input(s): CHOL, HDL, LDLCALC, TRIG, CHOLHDL, LDLDIRECT in the last 72 hours. Thyroid Function Tests No results for input(s): TSH, T4TOTAL, T3FREE, THYROIDAB in the last 72 hours.  Invalid input(s): FREET3  Other results:   Imaging    No results found.   Medications:     Scheduled Medications: . capsaicin   Topical BID  . Chlorhexidine Gluconate Cloth  6 each Topical Daily  . gabapentin  100 mg Oral Daily  . insulin aspart  0-20 Units Subcutaneous Q4H  . insulin aspart  4 Units Subcutaneous Q4H  . insulin detemir  15 Units Subcutaneous BID  . lidocaine   Topical BID  . magic mouthwash  10 mL  Oral TID  . mouth rinse  15 mL Mouth Rinse BID  . multivitamin with minerals  1 tablet Per Tube Daily  . pantoprazole  40 mg Intravenous Q12H  . sodium chloride flush  10-40 mL Intracatheter Q12H  . thiamine injection  100 mg Intravenous Daily    Infusions: . sodium chloride 10 mL/hr at 09/29/20 1700  . anticoagulant sodium citrate    . bivalirudin (ANGIOMAX) infusion 0.5 mg/mL (Non-ACS indications) 0.11 mg/kg/hr (09/29/20 1700)  . citrate dextrose    . DOBUTamine 6.5 mcg/kg/min (09/29/20 1700)  . feeding supplement (PIVOT 1.5 CAL) 1,000 mL (09/29/20 0556)  . methylPREDNISolone (SOLU-MEDROL) injection Stopped (09/29/20 1343)  . norepinephrine (LEVOPHED) Adult infusion 10 mcg/min (09/29/20 1700)  . prismasol BGK 2/2.5 dialysis solution 1,500 mL/hr at 09/29/20 1649  . prismasol BGK 2/2.5 replacement solution 500 mL/hr at 09/29/20 1000  . prismasol BGK 2/2.5 replacement solution 300 mL/hr at 09/29/20 1323    PRN Medications: sodium chloride, acetaminophen (TYLENOL) oral liquid 160 mg/5 mL, diphenhydrAMINE, docusate, oxyCODONE, sodium chloride flush    Patient Profile   55 y/o male w/ no prior cardiac history, admitted for mixed cardiogenic + septic shock in setting of PNA + hepatic encephalopathy.   ECHO LVEF 15% with severe global HK with regional variability + LV clot RV moderately down. Initial Co-ox 48%. Started on DBA.    Assessment/Plan   1. Cardiogenic shock with multisystem organ failure - ECHO LVEF 15% with severe global HK with regional variability + LV clot RV moderately down. - Remains on DBA 6.5. NE 15 --> CO-OX 40% -> 45% -> 55% - On CVVHD for volume removal. Weight down 24 pounds. Nearing euvolemia - Unifying diagnosis remains unclear. Serologies unrevealing so far. Preliminary muscle bx results nonspecific  - Weakness and MSOF well out of proportion to HF.  - Seems to be responding to PLEX. Finished PLEX 5/5 on 4/29 - He has responded to PLEX. Now more  responsive to pressor support with co-ox improving. Seems to be getting ischemic toes. Will drop NE to 10. Continue DBA at 6.  - Without knowing underlying cause of myositis, I am worried he will deteriorate again after PLEX stopped. Should he get IVIG?   2. LV thrombus - has been off heparin due to thigh bleed (at muscle bx site) and thrombocytopenia - HIT screen + SRA pending - Tolerating bival  3. Severe myositis/rhabdo - Serologies unrevealing so far. Preliminary muscle bx results  nonspecific  - Neuro and onc have seen - He has responded to PLEX. - Without knowing underlying cause of myositis, I am worried he will deteriorate again after PLEX stopped. Should he get IVIG. Follow closely. Await final muscle biopsy results.    4. AKI due to ATN/shock - Baseline creatinine unknown  - SCr 1.5 in 2016 in Care Everywhere - Started CVVHD 4/25.  - Per Nephrology   5. CAP - PCT 3.70 -> 2.9 - BC NGTD  - was on cefepime and ancef. Off antibiotics.    6. ABLA - likely due to left thigh hematoma +/- GI bleed - Hgb 7.1 - consider transfusion to keep >= 7.5  7. 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  - on bival   CRITICAL CARE Performed by: Arvilla Meres  Total critical care time: 40 minutes  Critical care time was exclusive of separately billable procedures and treating other patients.  Critical care was necessary to treat or prevent imminent or life-threatening deterioration.  Critical care was time spent personally by me (independent of midlevel providers or residents) on the following activities: development of treatment plan with patient and/or surrogate as well as nursing, discussions with consultants, evaluation of patient's response to treatment, examination of patient, obtaining history from patient or surrogate, ordering and performing treatments and interventions, ordering and review of  laboratory studies, ordering and review of radiographic studies, pulse oximetry and re-evaluation of patient's condition.    Length of Stay: 48  Arvilla Meres, MD  09/29/2020, 5:09 PM  Advanced Heart Failure Team Pager [phone] (M-F; 7a - 5p)  Please contact [facility] for night-coverage after hours (5p -7a ) and weekends on [email or web address]

## 2020-09-29 DEATH — deceased

## 2020-09-30 ENCOUNTER — Inpatient Hospital Stay (HOSPITAL_COMMUNITY): Payer: BLUE CROSS/BLUE SHIELD

## 2020-09-30 ENCOUNTER — Other Ambulatory Visit: Payer: Self-pay

## 2020-09-30 DIAGNOSIS — Z515 Encounter for palliative care: Secondary | ICD-10-CM

## 2020-09-30 DIAGNOSIS — J9601 Acute respiratory failure with hypoxia: Secondary | ICD-10-CM

## 2020-09-30 DIAGNOSIS — N189 Chronic kidney disease, unspecified: Secondary | ICD-10-CM | POA: Diagnosis not present

## 2020-09-30 DIAGNOSIS — G839 Paralytic syndrome, unspecified: Secondary | ICD-10-CM | POA: Diagnosis not present

## 2020-09-30 DIAGNOSIS — K7290 Hepatic failure, unspecified without coma: Secondary | ICD-10-CM

## 2020-09-30 DIAGNOSIS — R570 Cardiogenic shock: Secondary | ICD-10-CM | POA: Diagnosis not present

## 2020-09-30 DIAGNOSIS — R760 Raised antibody titer: Secondary | ICD-10-CM

## 2020-09-30 DIAGNOSIS — N179 Acute kidney failure, unspecified: Secondary | ICD-10-CM | POA: Diagnosis not present

## 2020-09-30 DIAGNOSIS — Z7189 Other specified counseling: Secondary | ICD-10-CM

## 2020-09-30 DIAGNOSIS — N1832 Chronic kidney disease, stage 3b: Secondary | ICD-10-CM | POA: Diagnosis not present

## 2020-09-30 DIAGNOSIS — D689 Coagulation defect, unspecified: Secondary | ICD-10-CM

## 2020-09-30 DIAGNOSIS — R4182 Altered mental status, unspecified: Secondary | ICD-10-CM

## 2020-09-30 DIAGNOSIS — Z9911 Dependence on respirator [ventilator] status: Secondary | ICD-10-CM

## 2020-09-30 DIAGNOSIS — D649 Anemia, unspecified: Secondary | ICD-10-CM | POA: Diagnosis not present

## 2020-09-30 DIAGNOSIS — R918 Other nonspecific abnormal finding of lung field: Secondary | ICD-10-CM | POA: Diagnosis not present

## 2020-09-30 DIAGNOSIS — K1230 Oral mucositis (ulcerative), unspecified: Secondary | ICD-10-CM

## 2020-09-30 DIAGNOSIS — N170 Acute kidney failure with tubular necrosis: Secondary | ICD-10-CM | POA: Diagnosis not present

## 2020-09-30 DIAGNOSIS — I5021 Acute systolic (congestive) heart failure: Secondary | ICD-10-CM | POA: Diagnosis not present

## 2020-09-30 DIAGNOSIS — D7582 Heparin induced thrombocytopenia (HIT): Secondary | ICD-10-CM

## 2020-09-30 LAB — APTT
aPTT: 49 seconds — ABNORMAL HIGH (ref 24–36)
aPTT: 57 seconds — ABNORMAL HIGH (ref 24–36)
aPTT: 56 seconds — ABNORMAL HIGH (ref 24–36)

## 2020-09-30 LAB — POCT I-STAT 7, (LYTES, BLD GAS, ICA,H+H)
Acid-Base Excess: 0 mmol/L (ref 0.0–2.0)
Acid-Base Excess: 5 mmol/L — ABNORMAL HIGH (ref 0.0–2.0)
Bicarbonate: 22.4 mmol/L (ref 20.0–28.0)
Bicarbonate: 27.5 mmol/L (ref 20.0–28.0)
Calcium, Ion: 0.98 mmol/L — ABNORMAL LOW (ref 1.15–1.40)
Calcium, Ion: 1.03 mmol/L — ABNORMAL LOW (ref 1.15–1.40)
HCT: 24 % — ABNORMAL LOW (ref 39.0–52.0)
HCT: 29 % — ABNORMAL LOW (ref 39.0–52.0)
Hemoglobin: 8.2 g/dL — ABNORMAL LOW (ref 13.0–17.0)
Hemoglobin: 9.9 g/dL — ABNORMAL LOW (ref 13.0–17.0)
O2 Saturation: 100 %
O2 Saturation: 100 %
Patient temperature: 98.2
Patient temperature: 98.2
Potassium: 4.5 mmol/L (ref 3.5–5.1)
Potassium: 4.3 mmol/L (ref 3.5–5.1)
Sodium: 132 mmol/L — ABNORMAL LOW (ref 135–145)
Sodium: 132 mmol/L — ABNORMAL LOW (ref 135–145)
TCO2: 23 mmol/L (ref 22–32)
TCO2: 28 mmol/L (ref 22–32)
pCO2 arterial: 28.1 mmHg — ABNORMAL LOW (ref 32.0–48.0)
pCO2 arterial: 32.3 mmHg (ref 32.0–48.0)
pH, Arterial: 7.508 — ABNORMAL HIGH (ref 7.350–7.450)
pH, Arterial: 7.537 — ABNORMAL HIGH (ref 7.350–7.450)
pO2, Arterial: 383 mmHg — ABNORMAL HIGH (ref 83.0–108.0)
pO2, Arterial: 584 mmHg — ABNORMAL HIGH (ref 83.0–108.0)

## 2020-09-30 LAB — CBC
HCT: 23.9 % — ABNORMAL LOW (ref 39.0–52.0)
Hemoglobin: 7.3 g/dL — ABNORMAL LOW (ref 13.0–17.0)
MCH: 29.3 pg (ref 26.0–34.0)
MCHC: 30.5 g/dL (ref 30.0–36.0)
MCV: 96 fL (ref 80.0–100.0)
Platelets: 91 10*3/uL — ABNORMAL LOW (ref 150–400)
RBC: 2.49 MIL/uL — ABNORMAL LOW (ref 4.22–5.81)
RDW: 16.5 % — ABNORMAL HIGH (ref 11.5–15.5)
WBC: 25.1 10*3/uL — ABNORMAL HIGH (ref 4.0–10.5)
nRBC: 1.8 % — ABNORMAL HIGH (ref 0.0–0.2)

## 2020-09-30 LAB — COOXEMETRY PANEL
Carboxyhemoglobin: 1.4 % (ref 0.5–1.5)
Carboxyhemoglobin: 1.4 % (ref 0.5–1.5)
Carboxyhemoglobin: 1.8 % — ABNORMAL HIGH (ref 0.5–1.5)
Methemoglobin: 1.4 % (ref 0.0–1.5)
Methemoglobin: 1.3 % (ref 0.0–1.5)
Methemoglobin: 1.4 % (ref 0.0–1.5)
O2 Saturation: 36.8 %
O2 Saturation: 40.4 %
O2 Saturation: 66.1 %
Total hemoglobin: 7.3 g/dL — ABNORMAL LOW (ref 12.0–16.0)
Total hemoglobin: 8.1 g/dL — ABNORMAL LOW (ref 12.0–16.0)
Total hemoglobin: 7 g/dL — ABNORMAL LOW (ref 12.0–16.0)

## 2020-09-30 LAB — RENAL FUNCTION PANEL
Albumin: 2.8 g/dL — ABNORMAL LOW (ref 3.5–5.0)
Albumin: 2.6 g/dL — ABNORMAL LOW (ref 3.5–5.0)
Anion gap: 11 (ref 5–15)
Anion gap: 11 (ref 5–15)
BUN: 67 mg/dL — ABNORMAL HIGH (ref 6–20)
BUN: 79 mg/dL — ABNORMAL HIGH (ref 6–20)
CO2: 25 mmol/L (ref 22–32)
CO2: 23 mmol/L (ref 22–32)
Calcium: 7.8 mg/dL — ABNORMAL LOW (ref 8.9–10.3)
Calcium: 7.1 mg/dL — ABNORMAL LOW (ref 8.9–10.3)
Chloride: 99 mmol/L (ref 98–111)
Chloride: 99 mmol/L (ref 98–111)
Creatinine, Ser: 1.66 mg/dL — ABNORMAL HIGH (ref 0.61–1.24)
Creatinine, Ser: 1.81 mg/dL — ABNORMAL HIGH (ref 0.61–1.24)
GFR, Estimated: 49 mL/min — ABNORMAL LOW (ref >60)
GFR, Estimated: 44 mL/min — ABNORMAL LOW (ref >60)
Glucose, Bld: 185 mg/dL — ABNORMAL HIGH (ref 70–99)
Glucose, Bld: 156 mg/dL — ABNORMAL HIGH (ref 70–99)
Phosphorus: 3.3 mg/dL (ref 2.5–4.6)
Phosphorus: 3.1 mg/dL (ref 2.5–4.6)
Potassium: 4.4 mmol/L (ref 3.5–5.1)
Potassium: 4.4 mmol/L (ref 3.5–5.1)
Sodium: 135 mmol/L (ref 135–145)
Sodium: 133 mmol/L — ABNORMAL LOW (ref 135–145)

## 2020-09-30 LAB — GLUCOSE, CAPILLARY
Glucose-Capillary: 104 mg/dL — ABNORMAL HIGH (ref 70–99)
Glucose-Capillary: 104 mg/dL — ABNORMAL HIGH (ref 70–99)
Glucose-Capillary: 140 mg/dL — ABNORMAL HIGH (ref 70–99)
Glucose-Capillary: 157 mg/dL — ABNORMAL HIGH (ref 70–99)
Glucose-Capillary: 171 mg/dL — ABNORMAL HIGH (ref 70–99)
Glucose-Capillary: 174 mg/dL — ABNORMAL HIGH (ref 70–99)
Glucose-Capillary: 21 mg/dL — CL (ref 70–99)
Glucose-Capillary: 57 mg/dL — ABNORMAL LOW (ref 70–99)
Glucose-Capillary: 76 mg/dL (ref 70–99)

## 2020-09-30 LAB — MAGNESIUM: Magnesium: 2.9 mg/dL — ABNORMAL HIGH (ref 1.7–2.4)

## 2020-09-30 LAB — CK: Total CK: 4227 U/L — ABNORMAL HIGH (ref 49–397)

## 2020-09-30 LAB — LACTATE DEHYDROGENASE: LDH: 359 U/L — ABNORMAL HIGH (ref 98–192)

## 2020-09-30 MED ORDER — POLYETHYLENE GLYCOL 3350 17 G PO PACK: 17.0000 g | PACK | Freq: Every day | ORAL | Status: DC

## 2020-09-30 MED ORDER — ETOMIDATE 2 MG/ML IV SOLN
20.0000 mg | Freq: Once | INTRAVENOUS | Status: AC
  Administered 2020-09-30: 20 mg via INTRAVENOUS

## 2020-09-30 MED ORDER — ANTICOAGULANT SODIUM CITRATE 4% (200MG/5ML) IV SOLN
5.0000 mL | Freq: Once | Status: AC
  Administered 2020-09-30: 2.8 mL
  Filled 2020-09-30: qty 5

## 2020-09-30 MED ORDER — ROCURONIUM BROMIDE 10 MG/ML (PF) SYRINGE
PREFILLED_SYRINGE | INTRAVENOUS | Status: AC
  Filled 2020-09-30: qty 10

## 2020-09-30 MED ORDER — ORAL CARE MOUTH RINSE
15.0000 mL | OROMUCOSAL | Status: DC
  Administered 2020-09-30 – 2020-10-02 (×18): 15 mL via OROMUCOSAL

## 2020-09-30 MED ORDER — FENTANYL BOLUS VIA INFUSION
50.0000 ug | INTRAVENOUS | Status: DC | PRN
Start: 2020-09-30 — End: 2020-10-02
  Administered 2020-09-30: 50 ug via INTRAVENOUS
  Filled 2020-09-30: qty 100

## 2020-09-30 MED ORDER — ALTEPLASE 2 MG IJ SOLR
2.0000 mg | Freq: Once | INTRAMUSCULAR | Status: AC
  Administered 2020-09-30: 2 mg
  Filled 2020-09-30: qty 2

## 2020-09-30 MED ORDER — FENTANYL CITRATE (PF) 100 MCG/2ML IJ SOLN
50.0000 ug | Freq: Once | INTRAMUSCULAR | Status: DC
Start: 2020-09-30 — End: 2020-09-30

## 2020-09-30 MED ORDER — SODIUM CHLORIDE 0.9 % IV SOLN
250.0000 mg | Freq: Two times a day (BID) | INTRAVENOUS | Status: DC
  Administered 2020-10-01 – 2020-10-02 (×4): 250 mg via INTRAVENOUS
  Filled 2020-09-30 (×5): qty 2

## 2020-09-30 MED ORDER — FENTANYL CITRATE (PF) 100 MCG/2ML IJ SOLN: 100.0000 ug | Freq: Once | INTRAMUSCULAR | Status: AC

## 2020-09-30 MED ORDER — MIDAZOLAM HCL 2 MG/2ML IJ SOLN: 2.0000 mg | Freq: Once | INTRAMUSCULAR | Status: AC

## 2020-09-30 MED ORDER — FENTANYL 2500MCG IN NS 250ML (10MCG/ML) PREMIX INFUSION
50.0000 ug/h | INTRAVENOUS | Status: DC
  Administered 2020-09-30: 100 ug/h via INTRAVENOUS
  Administered 2020-10-01: 50 ug/h via INTRAVENOUS
  Filled 2020-09-30 (×2): qty 250

## 2020-09-30 MED ORDER — ETOMIDATE 2 MG/ML IV SOLN
INTRAVENOUS | Status: AC
  Filled 2020-09-30: qty 20

## 2020-09-30 MED ORDER — FENTANYL CITRATE (PF) 100 MCG/2ML IJ SOLN
INTRAMUSCULAR | Status: AC
  Administered 2020-09-30: 100 ug
  Filled 2020-09-30: qty 2

## 2020-09-30 MED ORDER — MIDAZOLAM HCL 2 MG/2ML IJ SOLN
INTRAMUSCULAR | Status: AC
  Administered 2020-09-30: 2 mg
  Filled 2020-09-30: qty 2

## 2020-09-30 MED ORDER — ALTEPLASE 2 MG IJ SOLR
2.0000 mg | Freq: Once | INTRAMUSCULAR | Status: AC
  Administered 2020-09-30: 2 mg

## 2020-09-30 MED ORDER — ENSURE ENLIVE PO LIQD: 237.0000 mL | Freq: Two times a day (BID) | ORAL | Status: DC

## 2020-09-30 MED ORDER — CHLORHEXIDINE GLUCONATE 0.12% ORAL RINSE (MEDLINE KIT)
15.0000 mL | Freq: Two times a day (BID) | OROMUCOSAL | Status: DC
  Administered 2020-09-30 – 2020-10-02 (×4): 15 mL via OROMUCOSAL

## 2020-09-30 NOTE — Progress Notes (Signed)
Notified that he suddenly dropped his oxygen saturations after being restarted on CRRT. He has been placed on NRB but there has been difficulty in obtaining peripheral saturations. He is less responsive now. His mother was recently here and noticed he was having staring spells but was able to wake up and talk to her before she left.   BP 109/72   Pulse (!) 114   Temp (!) 97 F (36.1 C) (Axillary)   Resp (!) 26   Ht 6' (1.829 m)   Wt 69.1 kg   SpO2 100%   BMI 20.66 kg/m  SBP >110, HR ~110, pulse ox not reading. Not responding during exam, but some nonpurposeful movement of head. Not responsive to arterial stick for ABG. PERRL  Attempted to call mother x 2- left messages. Spoke to Montz from Palliative Care regarding discussions during their meeting today. Family still wanted intubation but is limited code otherwise. Proceeded with intubation. Will need stat head CT after CXR to confirm ETT placement.  Steffanie Dunn, DO 09/30/20 4:34 PM [facility]

## 2020-09-30 NOTE — Progress Notes (Signed)
Physical Therapy Treatment Patient Details Name: Dennis Howard. MRN: [id number] DOB: 26-Nov-1965 Today's Date: 09/30/2020    History of Present Illness 55 y/o man who presented to [facility] on 4/15 with  AMS, cardiogenic + septic shock in setting of PNA + hepatic encephalopathy. Also with LV thrombus and NSTEMI.    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 Subacute ischemic changes of the cortical subcortical brain.  He has associated lower extremity swelling, weight gain, shortness of breath, and cough. Pending workup for neuromuscular disorder. S/P L quadriceps biopsy 4/20 resulted in left thigh hematoma. 4/25 back on pressors and CRRT initiated. PMH: chronic kidney disease, hypertension, Marijuana daily for 20 + years    PT Comments    Pt admitted with above diagnosis. Pt was able to sit EOB for 15 min with min guard assist to min assist. Worked on sitting posture as well as neck extension as pt flexes head forward. Also attempts for UE and LE exercise with pt doing some UE exercise but never moved LEs to command.  Did some stretching to hamstrings as they are tight as well as to calves.   Pt currently with functional limitations due to balance and endurance deficits. Pt will benefit from skilled PT to increase their independence and safety with mobility to allow discharge to the venue listed below.     Follow Up Recommendations  LTACH     Equipment Recommendations  Rolling walker with 5" wheels;3in1 (PT)    Recommendations for Other Services       Precautions / Restrictions Precautions Precautions: Fall Precaution Comments: CRRT, edematous LEs with painful feet, cortrak Restrictions Weight Bearing Restrictions: No    Mobility  Bed Mobility Overal bed mobility: Needs Assistance Bed Mobility: Supine to Sit;Sit to Supine     Supine to sit: +2 for safety/equipment;Total assist Sit to supine: +2  for safety/equipment;Total assist   General bed mobility comments: assist for all aspects, used towel under B LEs to minimize pain while moving them on/off bed, +2 total to pull up in bed    Transfers                 General transfer comment: deferred  Ambulation/Gait                 Stairs             Wheelchair Mobility    Modified Rankin (Stroke Patients Only) Modified Rankin (Stroke Patients Only) Pre-Morbid Rankin Score: No symptoms Modified Rankin: Severe disability     Balance Overall balance assessment: Needs assistance Sitting-balance support: Feet supported Sitting balance-Leahy Scale: Fair Sitting balance - Comments: sat EOB x 15  minutes with supervision to min guard assist and cues/physical assist to raise head/maintain in midline, facilitated scapular adduction and depression.  Performed a few UE and LE exercises while sitting as well. Postural control: Posterior lean;Right lateral lean                                  Cognition Arousal/Alertness: Lethargic Behavior During Therapy: Flat affect (smiled at end of session when told a joke) Overall Cognitive Status: Impaired/Different from baseline Area of Impairment: Attention;Memory;Following commands;Problem solving;Orientation                 Orientation Level: Time;Situation Current Attention Level: Sustained Memory: Decreased recall of precautions;Decreased short-term  memory Following Commands: Follows one step commands inconsistently;Follows one step commands with increased time Safety/Judgement: Decreased awareness of safety;Decreased awareness of deficits Awareness: Intellectual Problem Solving: Slow processing;Decreased initiation;Difficulty sequencing;Requires verbal cues;Requires tactile cues General Comments: Verbalizing, but minimally.      Exercises General Exercises - Upper Extremity Shoulder Flexion: AAROM;Both;10 reps;Supine Elbow Flexion:  Both;AAROM;5 reps;Seated;10 reps Elbow Extension: Both;Supine;AAROM;5 reps    General Comments General comments (skin integrity, edema, etc.): VSS      Pertinent Vitals/Pain Pain Assessment: Faces Faces Pain Scale: Hurts even more Pain Location: B feet Pain Descriptors / Indicators: Grimacing;Guarding Pain Intervention(s): Limited activity within patient's tolerance;Monitored during session;Repositioned    Home Living                      Prior Function            PT Goals (current goals can now be found in the care plan section) Acute Rehab PT Goals Patient Stated Goal: none stated PT Goal Formulation: With patient Time For Goal Achievement: 10/14/20 Potential to Achieve Goals: Good Progress towards PT goals: Progressing toward goals    Frequency    Min 3X/week      PT Plan Current plan remains appropriate    Co-evaluation              AM-PAC PT "6 Clicks" Mobility   Outcome Measure  Help needed turning from your back to your side while in a flat bed without using bedrails?: Total Help needed moving from lying on your back to sitting on the side of a flat bed without using bedrails?: Total Help needed moving to and from a bed to a chair (including a wheelchair)?: Total Help needed standing up from a chair using your arms (e.g., wheelchair or bedside chair)?: Total Help needed to walk in hospital room?: Total Help needed climbing 3-5 steps with a railing? : Total 6 Click Score: 6    End of Session Equipment Utilized During Treatment: Oxygen;Gait belt Activity Tolerance: Patient limited by fatigue Patient left: in bed;with call bell/phone within reach;with bed alarm set (bed in chair position) Nurse Communication: Mobility status PT Visit Diagnosis: Unsteadiness on feet (R26.81);Muscle weakness (generalized) (M62.81)     Time: 5625-6389 PT Time Calculation (min) (ACUTE ONLY): 25 min  Charges:  $Therapeutic Exercise: 8-22 mins $Therapeutic  Activity: 8-22 mins                     Dennis Howard M,PT Acute Rehab Services [phone] (pager)   Dennis Howard 09/30/2020, 3:44 PM

## 2020-09-30 NOTE — Progress Notes (Signed)
CRRT off for several hours tonight due to HD line issues. Previous RN had reported having issues with high return pressures and had to "flip" the CRRT lines to the HD catheter to run CRRT during shift.  Shortly after shift change, CRRT started alarming for high return pressures. Attempted repositioning pts neck, redressing HD line, and other interventions but unable to run CRRT due to high line pressures. Upon stopping CRRT the red port on HD was unable to draw back blood. Standing orders placed for TPA catheter clearance and IV therapy instilled TPA which dwelled for 2 hours.  Post TPA pts red port on HD line still unable to draw back blood. CRRT restarted but currently unable to run blood flow rate higher than 150.

## 2020-09-30 NOTE — Progress Notes (Signed)
Patient transported to and from CT w/o complications.  °

## 2020-09-30 NOTE — Progress Notes (Signed)
ANTICOAGULATION CONSULT NOTE  Pharmacy Consult for Bivalirudin Indication: LV mural thrombus  Heparin Dosing Weight:  77.4 kg  Labs: Recent Labs    09/28/20 0301 09/28/20 1258 09/29/20 0313 09/29/20 1031 09/29/20 1539 09/30/20 0309 09/30/20 0731 09/30/20 1237 09/30/20 1549 09/30/20 1656  HGB 7.1*  --  7.1*  --   --  7.3*  --   --   --   --   HCT 23.9*  --  23.1*  --   --  23.9*  --   --   --   --   PLT 55*  --  82*  --   --  91*  --   --   --   --   APTT 45*   < > 42* 48* 48* 49*  --  57*  --  56*  CREATININE 1.79*   < > 1.65*  --  1.54* 1.66*  --   --  1.81*  --   CKTOTAL 7,402*  --   --  6,503*  --   --  4,227*  --   --   --    < > = values in this interval not displayed.   Assessment: 55 yr old man presented with AMS. Pharmacy was originally consulted for heparin for NSTEMI, then continued for discovered LV thrombus. Pt was on no anticoagulants PTA.  -MRI brain on 4/20 with multiple acute infarcts and subacute ischemic changes (target lower end of goal range) -Patient was on IV heparin 4/16 until 4/26 (held intermittently for concerns of bleeding), stopped on 4/26 due to platelets dropping to 21. 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 SRA ordered 4/27, results still in process. Patient is off all heparin and was initiated on bivalirudin.  Bivalirudin preferred over argatroban due to liver dysfunction.  -Patient also on CRRT, s/p PLEX.  aPTT this afternoon remains therapeutic at 56. Hg low stable at 7.3, plt up to 91 today. No active bleed issues reported.   Goal of Therapy:  Goal aPTT 50-85 (no boluses) -- target lower end of goal ~50-70 given acute infarcts Monitor platelets by anticoagulation protocol: Yes   Plan:  -Continue bivalirudin at 0.13 mg/kg/hr -Monitor q12h aPTT, daily CBC, s/sx bleeding   Leia Alf, PharmD, BCPS Please check AMION for all [facility] contact numbers Clinical Pharmacist 09/30/2020 5:58  PM

## 2020-09-30 NOTE — TOC Progression Note (Addendum)
Transition of Care (TOC) - Progression Note  Heart Failure   Patient Details  Name: Dennis Howard. MRN: [id number] Date of Birth: 12-Aug-1965  Transition of Care [facility]) CM/SW Contact  Jaylan Hinojosa, [place] Phone Number: 09/30/2020, 2:45 PM  Clinical Narrative:    CSW met with the patient's mother, Mrs. Alandis Bluemel to discuss about the patient's bill's that she's been receiving in the mail. Mrs. Kelan Pritt brought [employer] statements and bills along with a credit card bill. Mrs. Withem paid the [employer] to keep her son's health insurance in place especially while he is still in the hospital. CSW suggested the patients mother call the credit card bill and other bills that come in and let them know he is in the hospital so that they won't send him to collections or to at least give them a heads up that the payments may be late. CSW discussed potentially setting her son up with disability through the [facility] depending on his disposition but can discuss this more later on as needed. The patient's mother discussed about concerns with the patient's visitors including her fears about her son's friends visiting and saying they are family members and trying to get information and making decisions for his medical care. Mrs. Jonahtan Manseau discussed her feelings around the situation and the aftermath of putting a code in place for visitors so that only those individuals with the code can visit her son to avoid any further situation or confusion moving forward.  CSW will continue to follow throughout discharge.    Barriers to Discharge: Continued Medical Work up  Expected Discharge Plan and Services   In-house Referral: Clinical Social Work     Living arrangements for the past 2 months: Apartment                                       Social Determinants of Health (SDOH) Interventions Food Insecurity Interventions: Assist with [employer] Application Financial Strain Interventions:  Intervention Not Indicated,Other (Comment) (Provided CSW name and contact info. and will provide resources to patient and for them to follow up with HF clinic upon d/c) Housing Interventions: Intervention Not Indicated Transportation Interventions: Intervention Not Indicated  Readmission Risk Interventions No flowsheet data found.  Nnamdi Dacus, MSW, [place] Heart Failure Social Worker

## 2020-09-30 NOTE — Consult Note (Signed)
Consultation Note Date: 09/30/2020   Patient Name: Dennis Howard.  DOB: 04-25-1966  MRN: [id number]  Age / Sex: 55 y.o., male  PCP: Dennis Landsman, MD Referring Physician: Julian Hy, DO  Reason for Consultation: Establishing goals of care  HPI/Patient Profile: 55 y.o. male  with past medical history of chronic kidney disease stage 3, hypertension, bronchitis admitted on 09/12/2020 with altered mental status with work up of possible myopathy/myositis and awaiting muscle biopsy results. Hospitalization further complicated by EF 78% with cardiogenic shock requiring dobutamine and norepi, CRRT, HIT, acute embolic stroke. Palliative care requested to discuss goals of care due to poor progression with ongoing severe debility and multiorgan failure.  Clinical Assessment and Goals of Care: I met today at Dennis Howard's bedside but no family present. Dennis Howard is awake and alert and he nods head yes/no. Difficult to know how much he understands or knows is truly going on as he is not verbal. He does track but he also is nods that his family had not been in today (his mother just left the room). PT is coming to work with him. Dennis Howard gives me permission to speak with his mother.   I find Dennis Howard mother, Dennis Howard, in waiting area finishing a conversation with Education officer, museum. Dennis Howard and I spoke about [name] and she shares that he has been living at home with her for some time now. He had in the past moved away briefly and was married but no longer married. She talks of helping him to get in rehab for drug abuse (she is unsure what substance) and says that he has been abusing drugs again during [place] (again she is unsure of what drugs but says she smells marijuana). Unclear history here. This all came into conversation as Dennis Howard had fears of his friends visiting and saying they are family members and trying to get information and make decisions for  Dennis Howard.   I spoke with Dennis Howard further regarding Dennis Howard's critical illness. He has severe heart and renal failure and now severe debility and weakness. Dennis Howard has good understanding of the severity of her son's illness and is aware that he may be reaching the end of his life. She shares that she does not want him resuscitated and he would be intubated only at his father's request to continue to try all other measures for Dennis Howard. Dennis Howard shares that she is very spiritual and prays for "God's will to be done" and not necessarily for healing. Dennis Howard is very much at peace with whatever happens. We did discuss that we may be nearing a place where we do not have much more to offer to improve Dennis Howard's health any more and Dennis Howard understands. We discussed that it is probably time to revisit our goals and expectations for Dennis Howard. For now Dennis Howard maintains desire to continue current care and intubation if needed at his father's request.   Update: I received call from Dr. Carlis Howard that Dennis Howard has had a sudden change in mental status and is requiring intubation. I updated that family were  still requesting intubation if needed as of ~1 hour ago. I called and spoke with Dennis Howard and explained that Dennis Howard's status has changed and he is currently in the process of being intubated. We discussed that this is extremely concerning for his progress and prognosis. I discussed with Dennis Howard about coming with her husband so we can discuss Dennis Howard's status and how to best move forward in Dennis Howard's care - she agrees and they will both be to bedside tomorrow. I did explain to Dennis Howard that I am concerned that the aggressive care we are providing Dennis Howard may no longer be benefiting him.   All questions/concerns addressed. Emotional support provided. Discussed with Dr. Carlis Howard.   Primary Decision Maker NEXT OF KIN mother and father    SUMMARY OF RECOMMENDATIONS   - [place] meeting tomorrow with both parents and will discuss consideration of comfort focused care option.   Code  Status/Advance Care Planning:  Limited code - intubation only   Symptom Management:   Per PCCM, heart failure, renal, neuro, hem/onc.   Palliative Prophylaxis:   Aspiration, Bowel Regimen, Delirium Protocol, Frequent Pain Assessment, Oral Care and Turn Reposition  Psycho-social/Spiritual:   Desire for further Chaplaincy support:yes  Additional Recommendations: Compassionate Wean Education and Grief/Bereavement Support  Prognosis:   Prognosis grim.   Discharge Planning: To Be Determined      Primary Diagnoses: Present on Admission: . Encephalopathy acute   I have reviewed the medical record, interviewed the patient and family, and examined the patient. The following aspects are pertinent.  Past Medical History:  Diagnosis Date  . Bronchitis   . Environmental allergies   . Hypertension    Social History   Socioeconomic History  . Marital status: Single    Spouse name: Not on file  . Number of children: Not on file  . Years of education: Not on file  . Highest education level: Not on file  Occupational History  . Not on file  Tobacco Use  . Smoking status: Former Research scientist (life sciences)  . Smokeless tobacco: Never Used  Vaping Use  . Vaping Use: Never used  Substance and Sexual Activity  . Alcohol use: Not Currently  . Drug use: No  . Sexual activity: Not on file  Other Topics Concern  . Not on file  Social History Narrative  . Not on file   Social Determinants of Health   Financial Resource Strain: Medium Risk  . Difficulty of Paying Living Expenses: Somewhat hard  Food Insecurity: Food Insecurity Present  . Worried About Charity fundraiser in the Last Year: Never true  . Ran Out of Food in the Last Year: Sometimes true  Transportation Needs: No Transportation Needs  . Lack of Transportation (Medical): No  . Lack of Transportation (Non-Medical): No  Physical Activity: Not on file  Stress: Not on file  Social Connections: Not on file   Family History   Problem Relation Age of Onset  . CAD Neg Hx    Scheduled Meds: . capsaicin   Topical BID  . Chlorhexidine Gluconate Cloth  6 each Topical Daily  . gabapentin  100 mg Oral Daily  . insulin aspart  0-20 Units Subcutaneous Q4H  . insulin aspart  4 Units Subcutaneous Q4H  . insulin detemir  15 Units Subcutaneous BID  . lidocaine   Topical BID  . magic mouthwash  10 mL Oral TID  . mouth rinse  15 mL Mouth Rinse BID  . multivitamin with minerals  1 tablet Per Tube Daily  .  pantoprazole  40 mg Intravenous Q12H  . sodium chloride flush  10-40 mL Intracatheter Q12H  . thiamine injection  100 mg Intravenous Daily   Continuous Infusions: . sodium chloride 10 mL/hr at 09/30/20 1400  . anticoagulant sodium citrate    . bivalirudin (ANGIOMAX) infusion 0.5 mg/mL (Non-ACS indications) 0.13 mg/kg/hr (09/30/20 1400)  . citrate dextrose    . DOBUTamine 6 mcg/kg/min (09/30/20 1400)  . feeding supplement (PIVOT 1.5 CAL) 1,000 mL (09/29/20 2300)  . [START ON 10/01/2020] methylPREDNISolone (SOLU-MEDROL) injection    . norepinephrine (LEVOPHED) Adult infusion 15 mcg/min (09/30/20 1400)  . prismasol BGK 2/2.5 dialysis solution 1,500 mL/hr at 09/30/20 1033  . prismasol BGK 2/2.5 replacement solution 500 mL/hr at 09/30/20 1004  . prismasol BGK 2/2.5 replacement solution 300 mL/hr at 09/30/20 1033   PRN Meds:.sodium chloride, acetaminophen (TYLENOL) oral liquid 160 mg/5 mL, diphenhydrAMINE, docusate, oxyCODONE, sodium chloride flush Allergies  Allergen Reactions  . Heparin     Heparin antibody positive; SRA pending (ordered 4/27)   Review of Systems  Unable to perform ROS: Acuity of condition    Physical Exam Vitals and nursing note reviewed.  Constitutional:      General: He is awake.     Appearance: He is ill-appearing.  Cardiovascular:     Rate and Rhythm: Tachycardia present.  Pulmonary:     Effort: No tachypnea, accessory muscle usage or respiratory distress.  Abdominal:     Palpations:  Abdomen is soft.  Neurological:     Mental Status: He is alert.     Comments: Nods head yes/no consistently to my questions, does not speak, tracks me throughout room. Unable to follow assess orientation.      Vital Signs: BP 106/71   Pulse (!) 115   Temp (!) 97 F (36.1 C) (Axillary)   Resp 17   Ht 6' (1.829 m)   Wt 69.1 kg   SpO2 100%   BMI 20.66 kg/m  Pain Scale: 0-10   Pain Score: 0-No pain   SpO2: SpO2: 100 % O2 Device:SpO2: 100 % O2 Flow Rate: .O2 Flow Rate (L/min): 1 L/min  IO: Intake/output summary:   Intake/Output Summary (Last 24 hours) at 09/30/2020 1516 Last data filed at 09/30/2020 1400 Gross per 24 hour  Intake 3141.13 ml  Output 3737 ml  Net -595.87 ml    LBM: Last BM Date: 09/30/20 Baseline Weight: Weight: 82.7 kg Most recent weight: Weight: 69.1 kg     Palliative Assessment/Data:     Time In: 1430 Time Out: 1540 Time Total: 70 min Greater than 50%  of this time was spent counseling and coordinating care related to the above assessment and plan.  Signed by: Vinie Sill, NP Palliative Medicine Team Pager # [phone] (M-F 8a-5p) Team Phone # [phone] (Nights/Weekends)

## 2020-09-30 NOTE — Progress Notes (Signed)
eLink Physician-Brief Progress Note Patient Name: Dennis Howard. DOB: 1965-12-08 MRN: [id number]   Date of Service  09/30/2020  HPI/Events of Note  Request received to increase ceiling rate of pressor for the purpose of going down to CT Norepinephrine dose fixed at 15 at this time due to concern for peripheral ischemia Ongoing CRRT  eICU Interventions  Will order norepinephrine dose range 0-20 for now and will reassess once back from CT     Intervention Category Major Interventions: Hypotension - evaluation and management  Darl Pikes 09/30/2020, 8:11 PM

## 2020-09-30 NOTE — Progress Notes (Addendum)
IP PROGRESS NOTE  Subjective:   Opens eyes. Does not answer questions. Discussed with nursing. No bleeding noted. He reported pain earlier today.  Objective: Vital signs in last 24 hours: Blood pressure 116/74, pulse (!) 113, temperature (!) 97 F (36.1 C), temperature source Axillary, resp. rate 13, height 6' (1.829 m), weight 69.1 kg, SpO2 100 %.  Intake/Output from previous day: 05/01 0701 - 05/02 0700 In: 3393 [P.O.:533; I.V.:1012; NG/GT:1640; IV Piggyback:208] Out: 5080   Physical Exam:  HEENT: no bleeding, nasal feeding tube in place, no thrush Abdomen: Soft, no hepatosplenomegaly, nontender Extremities: Trace edema at the lower leg bilaterally Musculoskeletal: There is swelling at the left mid and upper thigh Neurologic: Alert, less interactive today Skin: No ecchymoses  Right and left IJ catheter sites without evidence of infection or bleeding  Lab Results: Recent Labs    09/29/20 0313 09/30/20 0309  WBC 26.2* 25.1*  HGB 7.1* 7.3*  HCT 23.1* 23.9*  PLT 82* 91*  ADAMTS13 on 09/17/2020 - 38.2% Blood smear 09/24/2020: The platelets are decreased in number, no platelet clumps.  There is a leukocytosis.  The majority the white cells are mature neutrophils.  No blast or other young forms are seen.  Increased nucleated red cells.  The polychromasia is increased.  There are burr cells, target cells, acanthocytes, few ovalocytes, and a few schistocytes. BMET Recent Labs    09/29/20 1539 09/30/20 0309  NA 134* 135  K 3.7 4.4  CL 98 99  CO2 25 25  GLUCOSE 213* 185*  BUN 58* 67*  CREATININE 1.54* 1.66*  CALCIUM 7.9* 7.8*   Bilirubin 1.6, LDH 594 No results found for: CEA1  Studies/Results: No results found.  Medications: I have reviewed the patient's current medications.  Assessment/Plan: 1.  Pulmonary infiltrates 2.  Chronic/acute renal failure 3.  Cardiogenic shock on hospital admission 4.  Liver failure secondary to cardiogenic shock, underlying chronic  liver disease? 5.  Anemia 6.  Thrombocytopenia  Daily plasmapheresis 09/24/2020  Solu-Medrol 09/24/2020 7.   Coagulopathy 8.   Altered mental status-hepatic encephalopathy? 9.   Rhabdomyolysis 10.  Oral candidiasis 11.  Muscle weakness-status post a muscle biopsy 10-07-20 12.  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 Placencia appears unchanged.  He remains critically ill with multiorgan failure.  His hemoglobin is stable and platelets are trending upward.  He received 5 sessions of plasmapheresis.  It is unclear if platelet response is due to plasmapheresis versus steroids versus discontinuation of heparin. The CK and LDH are partially improved today.  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  SRA pending.  Currently receiving bivalirudin.  The muscle biopsy preliminary was reported as focal inflammation which was not abundant, some minor changes of myositis, but not conclusive.  Final pathology is still pending.  We will follow-up on the final pathology result.  Recommendations:  1.  Transfuse packed red blood cells as needed 2.  Daily CBC 3.  We will follow-up on final muscle biopsy results. 4.  Steroids as recommended by the critical care and neurology services -steroids will start to be weaned on 5/3 5.  CRRT per Nephrology.  6.  Follow-up on SRA.     LOS: 17 days  Mr. Heslin was interviewed and examined.  He was less alert and did not follow commands when I saw him at approximately 6:45 AM.  The platelet count has improved.  The  CPK and LDH are lower.  He will be followed off of plasma exchange.  He will begin a steroid taper.  He does not have a unifying diagnosis.  We are waiting on the final pathology from the muscle biopsy.  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  The serotonin release assay is pending.  Mancel Bale, MD

## 2020-09-30 NOTE — Progress Notes (Signed)
[facility] NEPHROLOGY PROGRESS NOTE  Assessment/ Plan: Pt is a 55 y.o. yo male  with history of hypertension, CKD was admitted on 4/15 with altered mental status, found to have systolic CHF, respiratory failure required mechanical ventilation, rhabdomyolysis, etiology is unclar.  Now seen as a consultation for the evaluation of acute kidney injury.  #Acute kidney injury on CKD stage III multifactorial etiology including ischemic ATN due to cardiogenic/septic shock, severe CHF with poor renal perfusion, rhabdomyolysis.  He is on inotrope and pressors to maintain blood pressure.  Given declining urine output, low GFR, azotemia and very high CK level, CRRT started on 4/25.  CRRT tolerating well with UF around 50-100 cc/hour.  Currently on all 2K bath with acceptable potassium level. UF as tolerated by BP.  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 Not ready for iHD transition.  Continue current CRRT prescription, tolerating well.  #Acute metabolic encephalopathy, also has uremia:  CT scan with stroke.  The mental status is gradually improving.  #Acute systolic CHF with cardiogenic shock, LV thrombus: Seen by cardiologist.  Currently on dobutamine, angiomax.  Not much improvement.  #Pulmonary infiltrates, pneumonia: Completed antibiotics.  #Diffuse muscle weakness with elevated CPK, nontraumatic rhabdomyolysis status post muscle biopsy pending final results.  Currently on IV Solu-Medrol and plasma exchange by hematologist.  Platelet count 55 today.  #Multiorgan failure including congestive hepatopathy, renal failure, CHF, stroke.    Subjective:   Stable on CRRT  Remains on NE and DBA  TPE is complete  K 4.4, P 3.3  On RA   Objective Vital signs in last 24 hours: Vitals:   09/30/20 1200 09/30/20 1230 09/30/20 1300 09/30/20 1330  BP:  109/68 116/74 110/71  Pulse: (!) 111  (!) 113 (!) 113  Resp: 10 13 13  (!) 26  Temp:      TempSrc:      SpO2: 100%  100% 100%   Weight:      Height:       Weight change: -2.8 kg  Intake/Output Summary (Last 24 hours) at 09/30/2020 1347 Last data filed at 09/30/2020 1203 Gross per 24 hour  Intake 3004.98 ml  Output 4343 ml  Net -1338.02 ml       Labs: Basic Metabolic Panel: Recent Labs  Lab 09/29/20 0313 09/29/20 1539 09/30/20 0309  NA 136 134* 135  K 4.2 3.7 4.4  CL 98 98 99  CO2 28 25 25   GLUCOSE 172* 213* 185*  BUN 63* 58* 67*  CREATININE 1.65* 1.54* 1.66*  CALCIUM 7.7* 7.9* 7.8*  PHOS 3.3 3.2 3.3   Liver Function Tests: Recent Labs  Lab 09/25/20 0315 09/25/20 0759 09/26/20 0320 09/26/20 1551 09/29/20 1031 09/29/20 1539 09/30/20 0309  AST 282*  --  271*  --  105*  --   --   ALT 288*  --  247*  --  175*  --   --   ALKPHOS 100  --  88  --  70  --   --   BILITOT 1.7*  --  1.4*  --  1.0  --   --   PROT 5.3*  --  5.6*  --  5.7*  --   --   ALBUMIN 2.8*   < > 3.1*  3.0*   < > 3.1* 3.1* 2.8*   < > = values in this interval not displayed.   No results for input(s): LIPASE, AMYLASE in the last 168 hours. No results for input(s): AMMONIA in the last 168 hours.  CBC: Recent Labs  Lab 09/26/20 1518 09/27/20 0301 09/28/20 0301 09/29/20 0313 09/30/20 0309  WBC 28.2* 26.1* 25.5* 26.2* 25.1*  NEUTROABS  --  23.7*  --   --   --   HGB 7.6* 7.4* 7.1* 7.1* 7.3*  HCT 24.6* 24.0* 23.9* 23.1* 23.9*  MCV 96.5 96.8 99.2 97.5 96.0  PLT 24* 35* 55* 82* 91*   Cardiac Enzymes: Recent Labs  Lab 09/26/20 0320 09/27/20 0301 09/28/20 0301 09/29/20 1031 09/30/20 0731  CKTOTAL 25,775* 14,656* 7,402* 5,977* 4,227*   CBG: Recent Labs  Lab 09/29/20 1907 09/29/20 2351 09/30/20 0303 09/30/20 0740 09/30/20 1117  GLUCAP 166* 150* 174* 140* 171*    Iron Studies: No results for input(s): IRON, TIBC, TRANSFERRIN, FERRITIN in the last 72 hours. Studies/Results: No results found.  Medications: Infusions: . sodium chloride 10 mL/hr at 09/30/20 1200  . anticoagulant sodium citrate    .  bivalirudin (ANGIOMAX) infusion 0.5 mg/mL (Non-ACS indications) 0.13 mg/kg/hr (09/30/20 1200)  . citrate dextrose    . DOBUTamine 6 mcg/kg/min (09/30/20 1200)  . feeding supplement (PIVOT 1.5 CAL) 1,000 mL (09/29/20 2300)  . [START ON 10/01/2020] methylPREDNISolone (SOLU-MEDROL) injection    . norepinephrine (LEVOPHED) Adult infusion 15 mcg/min (09/30/20 1200)  . prismasol BGK 2/2.5 dialysis solution 1,500 mL/hr at 09/30/20 1033  . prismasol BGK 2/2.5 replacement solution 500 mL/hr at 09/30/20 1004  . prismasol BGK 2/2.5 replacement solution 300 mL/hr at 09/30/20 1033    Scheduled Medications: . capsaicin   Topical BID  . Chlorhexidine Gluconate Cloth  6 each Topical Daily  . gabapentin  100 mg Oral Daily  . insulin aspart  0-20 Units Subcutaneous Q4H  . insulin aspart  4 Units Subcutaneous Q4H  . insulin detemir  15 Units Subcutaneous BID  . lidocaine   Topical BID  . magic mouthwash  10 mL Oral TID  . mouth rinse  15 mL Mouth Rinse BID  . multivitamin with minerals  1 tablet Per Tube Daily  . pantoprazole  40 mg Intravenous Q12H  . sodium chloride flush  10-40 mL Intracatheter Q12H  . thiamine injection  100 mg Intravenous Daily    have reviewed scheduled and prn medications.  Physical Exam: General: Critically ill looking male,in bed, alert awake.   Heart:RRR, s1s2 nl Lungs: Clear anteriorly, no increased work of breathing Abdomen:soft, Non-tender, non-distended Extremities: LE edema mildly improved. Dialysis Access: L IJ temporary HD catheter in place  Babs Dabbs B Tylesha Gibeault 09/30/2020,1:47 PM  LOS: 17 days

## 2020-09-30 NOTE — Progress Notes (Signed)
ANTICOAGULATION CONSULT NOTE  Pharmacy Consult for Bivalirudin Indication: LV mural thrombus  Heparin Dosing Weight:  77.4 kg  Labs: Recent Labs    09/28/20 0301 09/28/20 1258 09/29/20 0313 09/29/20 1031 09/29/20 1539 09/30/20 0309  HGB 7.1*  --  7.1*  --   --  7.3*  HCT 23.9*  --  23.1*  --   --  23.9*  PLT 55*  --  82*  --   --  91*  APTT 45*   < > 42* 48* 48* 49*  CREATININE 1.79*   < > 1.65*  --  1.54* 1.66*  CKTOTAL 7,402*  --   --  5,977*  --   --    < > = values in this interval not displayed.   Assessment: 55 yr old man presented with AMS. Pharmacy was originally consulted for heparin for NSTEMI, then continued for discovered LV thrombus; pt was on no anticoagulants PTA. Pharmacy now consulted for bivalirudin. -MRI brain on 4/20 with multiple acute infarcts and subacute ischemic changes (target lower end of goal range) -Patient was on IV heparin 4/16 until 4/26 (held intermittently for concerns of bleeding), stopped on 4/26 due to platelets dropping to 21. 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 SRA ordered 4/27, results still in process. Patient is off all heparin and was initiated on bivalirudin.  Bivalirudin preferred over argatroban due to liver dysfunction.  -Patient also on CRRT, s/p PLEX   APTT= 49 and slightly below goal on bivalirudin 0.11 mg/kg/hr. Platelets improving today to 91.    Goal of Therapy:  Goal aPTT 50-85 (no boluses) -- target lower end of goal ~50-70 given acute infarcts Monitor platelets by anticoagulation protocol: Yes   Plan:  -Increase bivalirudin to 0.13 mg/kg/hr (on CRRT) -F/u aPTT in 4 hours -Monitor daily CBC  Rexford Maus, PharmD PGY-1 Acute Care Pharmacy Resident Office: [phone] 09/30/2020 8:35 AM

## 2020-09-30 NOTE — Progress Notes (Signed)
ANTICOAGULATION CONSULT NOTE  Pharmacy Consult for Bivalirudin Indication: LV mural thrombus  Heparin Dosing Weight:  77.4 kg  Labs: Recent Labs    09/28/20 0301 09/28/20 1258 09/29/20 0313 09/29/20 1031 09/29/20 1539 09/30/20 0309 09/30/20 0731 09/30/20 1237  HGB 7.1*  --  7.1*  --   --  7.3*  --   --   HCT 23.9*  --  23.1*  --   --  23.9*  --   --   PLT 55*  --  82*  --   --  91*  --   --   APTT 45*   < > 42* 48* 48* 49*  --  57*  CREATININE 1.79*   < > 1.65*  --  1.54* 1.66*  --   --   CKTOTAL 7,402*  --   --  5,977*  --   --  4,227*  --    < > = values in this interval not displayed.   Assessment: 55 yr old man presented with AMS. Pharmacy was originally consulted for heparin for NSTEMI, then continued for discovered LV thrombus; pt was on no anticoagulants PTA. Pharmacy now consulted for bivalirudin. -MRI brain on 4/20 with multiple acute infarcts and subacute ischemic changes (target lower end of goal range) -Patient was on IV heparin 4/16 until 4/26 (held intermittently for concerns of bleeding), stopped on 4/26 due to platelets dropping to 21. 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 SRA ordered 4/27, results still in process. Patient is off all heparin and was initiated on bivalirudin.  Bivalirudin preferred over argatroban due to liver dysfunction.  -Patient also on CRRT, s/p PLEX   Repeat aPTT this afternoon was therapeutic at 57.  Continue current bivalirudin rate. Platelets improving today to 91.    Goal of Therapy:  Goal aPTT 50-85 (no boluses) -- target lower end of goal ~50-70 given acute infarcts Monitor platelets by anticoagulation protocol: Yes   Plan:  -Continue bivalirudin 0.13 mg/kg/hr (on CRRT) -Monitor q12hours aPTT -Monitor daily CBC  Rexford Maus, PharmD PGY-1 Acute Care Pharmacy Resident Office: [phone] 09/30/2020 2:46 PM

## 2020-09-30 NOTE — Progress Notes (Signed)
Nutrition Follow-up  DOCUMENTATION CODES:   Severe malnutrition in context of acute illness/injury  INTERVENTION:   Continue tube feeds via Cortrak: - Pivot 1.5 @ 65 ml/hr (1560 ml/day)  Tube feeding regimen provides 2340 kcal, 146 grams of protein, and 1170 ml of H2O.  - Continue MVI with minerals daily per tube  - Ensure Enlive po BID, each supplement provides 350 kcal and 20 grams of protein  - Encourage adequate PO intake  NUTRITION DIAGNOSIS:   Severe Malnutrition related to decreased appetite,acute illness (sore tongue) as evidenced by moderate muscle depletion,moderate fat depletion.  Ongoing, being addressed via TF  GOAL:   Patient will meet greater than or equal to 90% of their needs  Met via TF  MONITOR:   PO intake,Supplement acceptance,TF tolerance  REASON FOR ASSESSMENT:   Consult Assessment of nutrition requirement/status  ASSESSMENT:   Pt with PMH of CKD stage III, and HTN admitted 4/15 with mixed cardiogenic and septic shock and multisystem organ failure in setting of PNA.  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 Per notes, pt seems to be deteriorating again after PLEX stopped. Muscle biopsy results are still pending. Pt has unable to be weaned on inotropes and per notes is not a candidate for mechanical support. Palliative Care has been consulted.  Cortrak remains in place with tube feeds infusing as ordered.  Pt with non-pitting edema to BUE and moderate pitting edema to BLE.  Spoke with RN at bedside who reports pt does well with drinking fluids (juice, tea) but only ate a few bites at lunch meal today. Spoke with pt who nodded head to communicate. Pt reports that he is willing to try Ensure supplements. Pt has tried [employer] supplements in the past and  they were okay.  Will continue with continuous tube feeds at this time given variable/inconsistent PO intake.  Admit weight: 82.7 kg Current weight: 69.1 kg (lowest weight)  Meal Completion: 0-50% x last 8 documented meals  Medications reviewed and include: SSI q 4 hours, novolog 4 units q 4 hours, levemir 15 units BID, magic mouthwash, MVI with minerals, IV protonix, thiamine, angiomax, dobutamine, levophed  Labs reviewed: BUN 67, creatinine 1.66, magnesium 2.9, hemoglobin 7.3 CBG's: 140-174 x 24 hours  CRRT UF: 5080 ml x 24 hours I/O's: -12.5 L since admit  Diet Order:   Diet Order            DIET DYS 3 Room service appropriate? Yes; Fluid consistency: Thin  Diet effective now                 EDUCATION NEEDS:   No education needs have been identified at this time  Skin:  Skin Assessment: Skin Integrity Issues: Incisions: L thigh s/p biopsy Other: popped blister R ankle, blister to L pretibial  Last BM:  09/30/20  Height:   Ht Readings from Last 1 Encounters:  09/14/20 6' (1.829 m)    Weight:   Wt Readings from Last 1 Encounters:  09/30/20 69.1 kg    Ideal Body Weight:  80.9 kg  BMI:  Body mass index is 20.66 kg/m.  Estimated Nutritional Needs:   Kcal:  2300-2500  Protein:  140-160 grams  Fluid:  2 L/day    Gustavus Bryant, MS, RD, LDN Inpatient Clinical Dietitian Please see Shea Evans  for contact information.

## 2020-09-30 NOTE — Procedures (Signed)
Intubation Procedure Note  Dennis Howard  [id number]  06-13-1965  Date:09/30/20  Time:4:33 PM   Provider Performing:Vyron Fronczak S London Pepper and Dr. Zoe Lan   Procedure: Intubation (31500)  Indication(s) Respiratory Failure  Consent Unable to obtain consent due to emergent nature of procedure.   Anesthesia Etomidate, Versed and Fentanyl   Time Out Verified patient identification, verified procedure, site/side was marked, verified correct patient position, special equipment/implants available, medications/allergies/relevant history reviewed, required imaging and test results available.   Sterile Technique Usual hand hygeine, masks, and gloves were used   Procedure Description Patient positioned in bed supine.  Sedation given as noted above.  Patient was intubated with endotracheal tube using Glidescope.  Number of attempts was 3. No NMB was utilized due to patient medical history. Attempts 1 and 2 aborted due to patient biting glidescope. Intubated attempt 3 successful after adequate sedation. View was Grade 1 full glottis . Colorimetric CO2 detector was consistent with tracheal placement. Bilateral breath sounds present. Chest x-ray ordered and pending.    Complications/Tolerance None; patient tolerated the procedure well. Chest X-ray is ordered to verify placement.   EBL None   Specimen(s) None  Gershon Mussel., MSN, APRN, AGACNP-BC [facility]  09/30/2020 , 4:36 PM  Please see [email or web address] for pager details  If no response, please call [phone] After hours, please call Elink at [phone]

## 2020-09-30 NOTE — Progress Notes (Addendum)
Advanced Heart Failure Rounding Note  PCP-Cardiologist: No primary care provider on file.    Subjective   55 y/o male w/ no prior cardiac history, admitted for mixed cardiogenic + septic shock in setting of PNA + hepatic encephalopathy.   ECHO LVEF 15% with severe global HK with regional variability + LV clot RV moderately down. Initial Co-ox 48%. Started on DBA.    Remains on DBA 6 mcg/kg/min.  NE reduced yesterday from 15>>10 mcg due to concern for ischemic toes. Co-ox down 55>>37%   Finished PLEX 5/5 on 4/29   Remains on HD, pulling 50-100 cc/hr. CVP 4.   Less responsive today.   Hgb 7.3. WBC 25K. AF   Plts 55>>82>>91K     Objective:   Weight Range: 69.1 kg Body mass index is 20.66 kg/m.   Vital Signs:   Temp:  [96.5 F (35.8 C)-98.7 F (37.1 C)] 96.5 F (35.8 C) (05/02 0700) Pulse Rate:  [60-126] 105 (05/02 0700) Resp:  [9-28] 13 (05/02 0700) BP: (90-129)/(56-109) 105/81 (05/02 0700) SpO2:  [97 %-100 %] 100 % (05/02 0700) Weight:  [69.1 kg] 69.1 kg (05/02 0500) Last BM Date: 09/28/20  Weight change: Filed Weights   09/28/20 0500 09/29/20 0500 09/30/20 0500  Weight: 73.9 kg 71.9 kg 69.1 kg    Intake/Output:   Intake/Output Summary (Last 24 hours) at 09/30/2020 0800 Last data filed at 09/30/2020 0700 Gross per 24 hour  Intake 3238.54 ml  Output 4792 ml  Net -1553.46 ml      Physical Exam   PHYSICAL EXAM: CVP 4  General:  Critically ill, lethargic,  No respiratory difficulty HEENT: + Cor track  Neck: supple. + HD cath, no JVD. Carotids 2+ bilat; no bruits. No lymphadenopathy or thyromegaly appreciated. Cor: PMI nondisplaced. Regular rhythm, mildly tachy rate. No rubs, gallops or murmurs. Lungs: clear Abdomen: soft, nontender, nondistended. No hepatosplenomegaly. No bruits or masses. Good bowel sounds. Extremities: no cyanosis, clubbing, rash, 1+ bilateral LE edema, cyanotic toes bilaterally, weak distal pulses  Neuro: lethargic, not  responding to commands     Telemetry   Sinus tach 110s, NSVT Personally reviewed  Labs    CBC Recent Labs    09/29/20 0313 09/30/20 0309  WBC 26.2* 25.1*  HGB 7.1* 7.3*  HCT 23.1* 23.9*  MCV 97.5 96.0  PLT 82* 91*   Basic Metabolic Panel Recent Labs    93/81/82 0313 09/29/20 1539 09/30/20 0309  NA 136 134* 135  K 4.2 3.7 4.4  CL 98 98 99  CO2 28 25 25   GLUCOSE 172* 213* 185*  BUN 63* 58* 67*  CREATININE 1.65* 1.54* 1.66*  CALCIUM 7.7* 7.9* 7.8*  MG 2.6*  --  2.9*  PHOS 3.3 3.2 3.3   Liver Function Tests Recent Labs    09/29/20 1031 09/29/20 1539 09/30/20 0309  AST 105*  --   --   ALT 175*  --   --   ALKPHOS 70  --   --   BILITOT 1.0  --   --   PROT 5.7*  --   --   ALBUMIN 3.1* 3.1* 2.8*   No results for input(s): LIPASE, AMYLASE in the last 72 hours. Cardiac Enzymes Recent Labs    09/28/20 0301 09/29/20 1031  CKTOTAL 7,402* 5,977*    BNP: BNP (last 3 results) Recent Labs    2020-09-19 2114 09/14/20 0031 09/15/20 0321  BNP 1,447.9* 1,523.7* 743.8*    ProBNP (last 3 results) No results for input(s): PROBNP  in the last 8760 hours.   D-Dimer No results for input(s): DDIMER in the last 72 hours. Hemoglobin A1C No results for input(s): HGBA1C in the last 72 hours. Fasting Lipid Panel No results for input(s): CHOL, HDL, LDLCALC, TRIG, CHOLHDL, LDLDIRECT in the last 72 hours. Thyroid Function Tests No results for input(s): TSH, T4TOTAL, T3FREE, THYROIDAB in the last 72 hours.  Invalid input(s): FREET3  Other results:   Imaging    No results found.   Medications:     Scheduled Medications: . capsaicin   Topical BID  . Chlorhexidine Gluconate Cloth  6 each Topical Daily  . gabapentin  100 mg Oral Daily  . insulin aspart  0-20 Units Subcutaneous Q4H  . insulin aspart  4 Units Subcutaneous Q4H  . insulin detemir  15 Units Subcutaneous BID  . lidocaine   Topical BID  . magic mouthwash  10 mL Oral TID  . mouth rinse  15 mL Mouth  Rinse BID  . multivitamin with minerals  1 tablet Per Tube Daily  . pantoprazole  40 mg Intravenous Q12H  . sodium chloride flush  10-40 mL Intracatheter Q12H  . thiamine injection  100 mg Intravenous Daily    Infusions: . sodium chloride 10 mL/hr at 09/30/20 0700  . anticoagulant sodium citrate    . bivalirudin (ANGIOMAX) infusion 0.5 mg/mL (Non-ACS indications) 0.11 mg/kg/hr (09/30/20 0700)  . citrate dextrose    . DOBUTamine 6 mcg/kg/min (09/30/20 0700)  . feeding supplement (PIVOT 1.5 CAL) 1,000 mL (09/29/20 2300)  . methylPREDNISolone (SOLU-MEDROL) injection 250 mg (09/30/20 0714)  . norepinephrine (LEVOPHED) Adult infusion 10 mcg/min (09/30/20 0700)  . prismasol BGK 2/2.5 dialysis solution 1,500 mL/hr at 09/30/20 0713  . prismasol BGK 2/2.5 replacement solution 500 mL/hr at 09/29/20 1000  . prismasol BGK 2/2.5 replacement solution 300 mL/hr at 09/29/20 1323    PRN Medications: sodium chloride, acetaminophen (TYLENOL) oral liquid 160 mg/5 mL, diphenhydrAMINE, docusate, oxyCODONE, sodium chloride flush    Patient Profile   55 y/o male w/ no prior cardiac history, admitted for mixed cardiogenic + septic shock in setting of PNA + hepatic encephalopathy.   ECHO LVEF 15% with severe global HK with regional variability + LV clot RV moderately down. Initial Co-ox 48%. Started on DBA.    Assessment/Plan   1. Cardiogenic shock with multisystem organ failure - ECHO LVEF 15% with severe global HK with regional variability + LV clot RV moderately down. - Remains on DBA 6.5. NE 15 --> CO-OX 40% -> 45% -> 55% - On CVVHD for volume removal. Weight down 24 pounds. Nearing euvolemia - Unifying diagnosis remains unclear. Serologies unrevealing so far. Preliminary muscle bx results nonspecific  - Weakness and MSOF well out of proportion to HF.  - Seems to be responding to PLEX. Finished PLEX 5/5 on 4/29 - He has responded to PLEX, stopped 4/29. Less responsive today.  Seems to be getting  ischemic toes. NE reduced to 10 w/ subsequent drop in Co-ox from 55>>37%. On DBA at 6.  - Repeat Co-ox. May need to increase NE dose back to 15  - Seems to be deteriorating again after PLEX Stopped. ? Should he get IVIG?   2. LV thrombus - has been off heparin due to thigh bleed (at muscle bx site) and thrombocytopenia - HIT screen + SRA pending - Tolerating bival  3. Severe myositis/rhabdo - Serologies unrevealing so far. Preliminary muscle bx results nonspecific  - Neuro and onc have seen - He has responded to  PLEX. - Without knowing underlying cause of myositis, I am worried he will deteriorate again after PLEX stopped. Should he get IVIG. Follow closely. Await final muscle biopsy results.    4. AKI due to ATN/shock - Baseline creatinine unknown  - SCr 1.5 in 2016 in Care Everywhere - Started CVVHD 4/25.  - Per Nephrology   5. CAP - PCT 3.70 -> 2.9 - BC NGTD  - was on cefepime and ancef. Off antibiotics.    6. ABLA - likely due to left thigh hematoma +/- GI bleed - Hgb 7.3 - consider transfusion to keep >= 7.5  7. Thrombocytopenia PLTs 77>59 > 23>21> 35k > 82->91k (improved with PLEX) - HIT + off heparin Hematology following.  - on bival  Length of Stay: [address], PA-C  09/30/2020, 8:00 AM  Advanced Heart Failure Team Pager [phone] (M-F; 7a - 5p)  Please contact [facility] for night-coverage after hours (5p -7a ) and weekends on [email or web address]  Agree.  Continues to deteriorate. Co-ox back down after NE dropped 15->10. Awake today but has difficulty speaking. Unable to move legs. Very weak in arms. Remains on CVVHD. CVP 4  General:  Awake very weak No resp difficulty HEENT: normal Neck: supple. no JVD. LIJ HD cath Carotids 2+ bilat; no bruits. No lymphadenopathy or thryomegaly appreciated. Cor: PMI nondisplaced. Tachy regular + s3 Lungs: clear Abdomen: soft, nontender, nondistended. No hepatosplenomegaly. No bruits or masses. Good bowel  sounds. Extremities: no cyanosis, clubbing, rash, tr edema cool  Bluish toes Neuro: diffusely weak. Unable to move legs. Can move UEs some   Continues to deteriorate with diffuse myopathic process of unclear etiology which is also affecting myocardium He seemed to benefit some from PLEX. Do we want to consider IVIG or other immunotherapy. Will increase NE to 15. Other than that I have little left to offer. Consider Palliative involvement. D/w Dr. Chestine Spore at bedside.   CRITICAL CARE Performed by: Arvilla Meres  Total critical care time: 35 minutes  Critical care time was exclusive of separately billable procedures and treating other patients.  Critical care was necessary to treat or prevent imminent or life-threatening deterioration.  Critical care was time spent personally by me (independent of midlevel providers or residents) on the following activities: development of treatment plan with patient and/or surrogate as well as nursing, discussions with consultants, evaluation of patient's response to treatment, examination of patient, obtaining history from patient or surrogate, ordering and performing treatments and interventions, ordering and review of laboratory studies, ordering and review of radiographic studies, pulse oximetry and re-evaluation of patient's condition.   Arvilla Meres, MD  8:33 AM

## 2020-09-30 NOTE — Progress Notes (Signed)
NAME:  Dennis Fickle., MRN:  [id number], DOB:  [birth year], LOS: 17 ADMISSION DATE:  09/06/2020, CONSULTATION DATE: 09/10/2020 REFERRING MD: Wilkie Aye - EM CHIEF COMPLAINT:  Altered Mental Status  History of Present Illness:  55 yo male former smoker presented with altered mental status that progressed for one week prior to admission.  This was associated with lower leg swelling with blisters, weight gain, dyspnea, and cough.  Also had progressive muscle weakness.  Had recent outpt treatment for pneumonia with doxycycline.  Pertinent  Medical History  CKD III - sr cr 2013 1.6, HTN, Bronchitis, THC Use    Significant Hospital Events: Including procedures, antibiotic start and stop dates in addition to other pertinent events   . 4/15 Admitted to ICU. Treated with azithro, ceftriaxone, flagyl, unasyn. Tylenol, acetaminophen, ETOH, influenza, COVID, hepatitis, urine strep antigen, legionella, & UDS negative.  . 4/16 ECHO with global hypokinesis, anteroseptal wall and apex are akinetic, anterior wall severely hypokinetic, very large layered semi-mobile apical thrombus extending from the apex along the anteroseptal wall, LVEF ~15-20%, RV systolic function mild to moderately reduced.  tarted on dobuatmine drip. ANA negative. Blood cultures negative.  . 4/17 Unasyn stopped, cefepime initiated  . 4/18 Dobutamine at , net neg 1.7L, tolerating soft diet, no n/v . 4/19 Dobutamine increased to 7.16mcg . 4/20 MRI Brain >> 39mm acute infarction at the inferior cerebellum on left and 84mm acute infarction at the left parietooccipital junction. Subacute ischemic changes of the cortical subcortical brain. . 4/22 Thigh hematoma post biopsy . 4/23 Dobutamine at 6.75mcg, pt on heparin gtt, PPI gtt. Muscle biopsy pending.  . 4/24 dark colored bowel movement, hemoglobin stable . 4/25 CK 32K, CRRT started . 4/26 PLEX started yesterday. . 4/30:Stronger, tolerating orally . 5/1: PLEX completed 4/30. Decrease norepi due  to purple dusky toes. . 5/2 drop in coox on lower NE> rate increased again  Interim History / Subjective:  He denies complaints.   Objective   Blood pressure 109/73, pulse (!) 104, temperature (!) 96.5 F (35.8 C), temperature source Axillary, resp. rate 15, height 6' (1.829 m), weight 69.1 kg, SpO2 99 %. CVP:  [0 mmHg-4 mmHg] 0 mmHg      Intake/Output Summary (Last 24 hours) at 09/30/2020 0973 Last data filed at 09/30/2020 0800 Gross per 24 hour  Intake 3392.89 ml  Output 5026 ml  Net -1633.11 ml   Filed Weights   09/28/20 0500 09/29/20 0500 09/30/20 0500  Weight: 73.9 kg 71.9 kg 69.1 kg    Examination:  General: Ill-appearing middle-aged man lying in bed neuro: Sleepy, arousable to stimulation, not talking much. Not moving extremities. HEENT: [place]/AT, eyes anicteric Neck: LIJ trialysis catheter without erythema Cardiovascular: tachycardic, reg rhythm Lungs: no tachypnea, no accessory muscle use or nasal flaring. CTAB, decreased basilar breath sounds. Abdomen: Soft, nontender, nondistended.  Hypoactive bowel sounds. Musculoskeletal: Mild dependent lower extremity edema and distal ankle edema. Skin: Warm, dry   Resolved Hospital Problem list   Encephalopathy  Assessment & Plan:   Diffuse muscle weakness with elevated CPK and aldolase- concern for myopathy or myositis.  Work-up to date has been negative. -Muscle biopsy results still pending. -Completed 5 days of Plex treatments.  Previously had no response to steroids.  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 -transfuse for Hb <7 or hemodynamically significant  bleeding -Continue full anticoagulation-bivalirudin -due to concern for GIB earlier this admission, will con't high dose PPI (steroids, AC  ongoing)  Hyperglycemia, controlled -Continue basal bolus insulin -SSI as needed - Goal BG 140-180  Acute embolic CVA -Eventually needs MRI when more stable -Reinvolve neurology when more stable from cardiac standpoint  Acute kidney injury on chronic kidney disease Rhabdomyolysis -CRRT per nephrology, appreciate their recommendations - Renally dose meds, avoid nephrotoxic meds  Elevated transaminase levels, potentially due to muscle breakdown.  Normal bilirubin.  Albumin minimally decreased, likely multifactorial.  Been previously minimally elevated Congestive hepatopathy -Continue to trend LFTs periodically  Thrush -Treated  Pain -Low-dose Neurontin  -Continue oxycodone as needed  GoC -Palliative Care consult -Will discuss further with the patient when he is more interactive. I am concerned that this may not have a correctable cause as he has failed to significantly improve on steroids and PLEX. He has not been able to be weaned down on inotropes and isn't a candidate for mechanical support. Guarded prognosis.  Best practice (right click and "Reselect all SmartList Selections" daily)  Diet:  Tube Feed , Mechanical soft diet  pain/Anxiety/Delirium protocol (if indicated): No VAP protocol (if indicated): Not indicated DVT prophylaxis: Systemic AC GI prophylaxis: PPI BID Glucose control:  SSI Yes and Basal insulin Yes Central venous access:  Yes, and it is still needed Arterial line:  N/A Foley:  Yes, and it is still needed,  Mobility:  bed rest  PT consulted: N/A Last date of multidisciplinary goals of care discussion - ongoing Code Status:  full code Disposition: ICU  Labs:   CMP Latest Ref Rng & Units 09/30/2020 09/29/2020 09/29/2020  Glucose 70 - 99 mg/dL 580(D) 983(J) -  BUN 6 - 20 mg/dL 82(N) 05(L) -  Creatinine 0.61 - 1.24 mg/dL 9.76(B) 3.41(P) -  Sodium 135 - 145 mmol/L 135 134(L) -  Potassium 3.5 - 5.1 mmol/L 4.4 3.7 -  Chloride 98 - 111 mmol/L 99 98 -  CO2 22  - 32 mmol/L 25 25 -  Calcium 8.9 - 10.3 mg/dL 7.8(L) 7.9(L) -  Total Protein 6.5 - 8.1 g/dL - - 5.7(L)  Total Bilirubin 0.3 - 1.2 mg/dL - - 1.0  Alkaline Phos 38 - 126 U/L - - 70  AST 15 - 41 U/L - - 105(H)  ALT 0 - 44 U/L - - 175(H)    CBC Latest Ref Rng & Units 09/30/2020 09/29/2020 09/28/2020  WBC 4.0 - 10.5 K/uL 25.1(H) 26.2(H) 25.5(H)  Hemoglobin 13.0 - 17.0 g/dL 7.3(L) 7.1(L) 7.1(L)  Hematocrit 39.0 - 52.0 % 23.9(L) 23.1(L) 23.9(L)  Platelets 150 - 400 K/uL 91(L) 82(L) 55(L)    ABG    Component Value Date/Time   PHART 7.540 (H) 09/19/2020 1650   PCO2ART 27.2 (L) 09/19/2020 1650   PO2ART 113 (H) 09/19/2020 1650   HCO3 23.3 09/19/2020 1650   TCO2 24 09/19/2020 1650   ACIDBASEDEF 1.8 09/14/2020 0031   O2SAT 36.8 09/30/2020 0309    CBG (last 3)  Recent Labs    09/29/20 2351 09/30/20 0303 09/30/20 0740  GLUCAP 150* 174* 140*   This patient is critically ill with multiple organ system failure which requires frequent high complexity decision making, assessment, support, evaluation, and titration of therapies. This was completed through the application of advanced monitoring technologies and extensive interpretation of multiple databases. During this encounter critical care time was devoted to patient care services described in this note for 37 minutes.   Steffanie Dunn, DO 09/30/20  9:27 AM [facility]

## 2020-10-01 ENCOUNTER — Inpatient Hospital Stay (HOSPITAL_COMMUNITY): Payer: BLUE CROSS/BLUE SHIELD

## 2020-10-01 DIAGNOSIS — N179 Acute kidney failure, unspecified: Secondary | ICD-10-CM | POA: Diagnosis not present

## 2020-10-01 DIAGNOSIS — Z7189 Other specified counseling: Secondary | ICD-10-CM | POA: Diagnosis not present

## 2020-10-01 DIAGNOSIS — R918 Other nonspecific abnormal finding of lung field: Secondary | ICD-10-CM | POA: Diagnosis not present

## 2020-10-01 DIAGNOSIS — R570 Cardiogenic shock: Secondary | ICD-10-CM | POA: Diagnosis not present

## 2020-10-01 DIAGNOSIS — I63531 Cerebral infarction due to unspecified occlusion or stenosis of right posterior cerebral artery: Secondary | ICD-10-CM | POA: Diagnosis not present

## 2020-10-01 DIAGNOSIS — Z515 Encounter for palliative care: Secondary | ICD-10-CM | POA: Diagnosis not present

## 2020-10-01 DIAGNOSIS — D7582 Heparin induced thrombocytopenia (HIT): Secondary | ICD-10-CM | POA: Diagnosis not present

## 2020-10-01 DIAGNOSIS — I959 Hypotension, unspecified: Secondary | ICD-10-CM

## 2020-10-01 DIAGNOSIS — Z9911 Dependence on respirator [ventilator] status: Secondary | ICD-10-CM | POA: Diagnosis not present

## 2020-10-01 DIAGNOSIS — N170 Acute kidney failure with tubular necrosis: Secondary | ICD-10-CM | POA: Diagnosis not present

## 2020-10-01 DIAGNOSIS — N189 Chronic kidney disease, unspecified: Secondary | ICD-10-CM | POA: Diagnosis not present

## 2020-10-01 DIAGNOSIS — J9601 Acute respiratory failure with hypoxia: Secondary | ICD-10-CM | POA: Diagnosis not present

## 2020-10-01 LAB — RENAL FUNCTION PANEL
Albumin: 2.5 g/dL — ABNORMAL LOW (ref 3.5–5.0)
Albumin: 2.4 g/dL — ABNORMAL LOW (ref 3.5–5.0)
Anion gap: 13 (ref 5–15)
Anion gap: 8 (ref 5–15)
BUN: 57 mg/dL — ABNORMAL HIGH (ref 6–20)
BUN: 49 mg/dL — ABNORMAL HIGH (ref 6–20)
CO2: 22 mmol/L (ref 22–32)
CO2: 25 mmol/L (ref 22–32)
Calcium: 7.4 mg/dL — ABNORMAL LOW (ref 8.9–10.3)
Calcium: 7.3 mg/dL — ABNORMAL LOW (ref 8.9–10.3)
Chloride: 100 mmol/L (ref 98–111)
Chloride: 102 mmol/L (ref 98–111)
Creatinine, Ser: 1.38 mg/dL — ABNORMAL HIGH (ref 0.61–1.24)
Creatinine, Ser: 1.2 mg/dL (ref 0.61–1.24)
GFR, Estimated: >60 mL/min (ref >60)
GFR, Estimated: >60 mL/min (ref >60)
Glucose, Bld: 134 mg/dL — ABNORMAL HIGH (ref 70–99)
Glucose, Bld: 102 mg/dL — ABNORMAL HIGH (ref 70–99)
Phosphorus: 4.4 mg/dL (ref 2.5–4.6)
Phosphorus: 3.9 mg/dL (ref 2.5–4.6)
Potassium: 4.1 mmol/L (ref 3.5–5.1)
Potassium: 3.9 mmol/L (ref 3.5–5.1)
Sodium: 135 mmol/L (ref 135–145)
Sodium: 135 mmol/L (ref 135–145)

## 2020-10-01 LAB — CBC
HCT: 22.5 % — ABNORMAL LOW (ref 39.0–52.0)
Hemoglobin: 6.9 g/dL — CL (ref 13.0–17.0)
MCH: 29.4 pg (ref 26.0–34.0)
MCHC: 30.7 g/dL (ref 30.0–36.0)
MCV: 95.7 fL (ref 80.0–100.0)
Platelets: 94 10*3/uL — ABNORMAL LOW (ref 150–400)
RBC: 2.35 MIL/uL — ABNORMAL LOW (ref 4.22–5.81)
RDW: 16.2 % — ABNORMAL HIGH (ref 11.5–15.5)
WBC: 23.9 10*3/uL — ABNORMAL HIGH (ref 4.0–10.5)
nRBC: 4.9 % — ABNORMAL HIGH (ref 0.0–0.2)

## 2020-10-01 LAB — CBC WITH DIFFERENTIAL/PLATELET
Abs Immature Granulocytes: 0.53 10*3/uL — ABNORMAL HIGH (ref 0.00–0.07)
Basophils Absolute: 0.1 10*3/uL (ref 0.0–0.1)
Basophils Relative: 0 %
Eosinophils Absolute: 0 10*3/uL (ref 0.0–0.5)
Eosinophils Relative: 0 %
HCT: 28.9 % — ABNORMAL LOW (ref 39.0–52.0)
Hemoglobin: 9.4 g/dL — ABNORMAL LOW (ref 13.0–17.0)
Immature Granulocytes: 1 %
Lymphocytes Relative: 0 %
Lymphs Abs: 0 10*3/uL — ABNORMAL LOW (ref 0.7–4.0)
MCH: 29.1 pg (ref 26.0–34.0)
MCHC: 32.5 g/dL (ref 30.0–36.0)
MCV: 89.5 fL (ref 80.0–100.0)
Monocytes Absolute: 1.4 10*3/uL — ABNORMAL HIGH (ref 0.1–1.0)
Monocytes Relative: 4 %
Neutro Abs: 34.6 10*3/uL — ABNORMAL HIGH (ref 1.7–7.7)
Neutrophils Relative %: 95 %
Platelets: 89 10*3/uL — ABNORMAL LOW (ref 150–400)
RBC: 3.23 MIL/uL — ABNORMAL LOW (ref 4.22–5.81)
RDW: 16.6 % — ABNORMAL HIGH (ref 11.5–15.5)
WBC: 36.6 10*3/uL — ABNORMAL HIGH (ref 4.0–10.5)
nRBC: 5.6 % — ABNORMAL HIGH (ref 0.0–0.2)

## 2020-10-01 LAB — GLUCOSE, CAPILLARY
Glucose-Capillary: 135 mg/dL — ABNORMAL HIGH (ref 70–99)
Glucose-Capillary: 142 mg/dL — ABNORMAL HIGH (ref 70–99)
Glucose-Capillary: 151 mg/dL — ABNORMAL HIGH (ref 70–99)
Glucose-Capillary: 80 mg/dL (ref 70–99)
Glucose-Capillary: 83 mg/dL (ref 70–99)
Glucose-Capillary: 94 mg/dL (ref 70–99)

## 2020-10-01 LAB — COOXEMETRY PANEL
Carboxyhemoglobin: 1.2 % (ref 0.5–1.5)
Carboxyhemoglobin: 1.3 % (ref 0.5–1.5)
Methemoglobin: 1 % (ref 0.0–1.5)
Methemoglobin: 1 % (ref 0.0–1.5)
O2 Saturation: 35 %
O2 Saturation: 50 %
Total hemoglobin: 6.9 g/dL — CL (ref 12.0–16.0)
Total hemoglobin: 9.4 g/dL — ABNORMAL LOW (ref 12.0–16.0)

## 2020-10-01 LAB — HEPATIC FUNCTION PANEL
ALT: 731 U/L — ABNORMAL HIGH (ref 0–44)
AST: 372 U/L — ABNORMAL HIGH (ref 15–41)
Albumin: 2.5 g/dL — ABNORMAL LOW (ref 3.5–5.0)
Alkaline Phosphatase: 120 U/L (ref 38–126)
Bilirubin, Direct: 0.7 mg/dL — ABNORMAL HIGH (ref 0.0–0.2)
Indirect Bilirubin: 1 mg/dL — ABNORMAL HIGH (ref 0.3–0.9)
Total Bilirubin: 1.7 mg/dL — ABNORMAL HIGH (ref 0.3–1.2)
Total Protein: 4.9 g/dL — ABNORMAL LOW (ref 6.5–8.1)

## 2020-10-01 LAB — MAGNESIUM: Magnesium: 3 mg/dL — ABNORMAL HIGH (ref 1.7–2.4)

## 2020-10-01 LAB — APTT
aPTT: 50 seconds — ABNORMAL HIGH (ref 24–36)
aPTT: 52 seconds — ABNORMAL HIGH (ref 24–36)
aPTT: 51 seconds — ABNORMAL HIGH (ref 24–36)

## 2020-10-01 LAB — CK: Total CK: 1979 U/L — ABNORMAL HIGH (ref 49–397)

## 2020-10-01 LAB — PREPARE RBC (CROSSMATCH)

## 2020-10-01 LAB — LACTATE DEHYDROGENASE: LDH: 1040 U/L — ABNORMAL HIGH (ref 98–192)

## 2020-10-01 MED ORDER — CHLORHEXIDINE GLUCONATE CLOTH 2 % EX PADS: 6.0000 | MEDICATED_PAD | Freq: Every day | CUTANEOUS | Status: DC

## 2020-10-01 MED ORDER — LORAZEPAM 2 MG/ML IJ SOLN: 2.0000 mg | INTRAMUSCULAR | Status: DC | PRN

## 2020-10-01 MED ORDER — INSULIN DETEMIR 100 UNIT/ML ~~LOC~~ SOLN
10.0000 [IU] | Freq: Two times a day (BID) | SUBCUTANEOUS | Status: DC
  Administered 2020-10-01 – 2020-10-02 (×2): 10 [IU] via SUBCUTANEOUS
  Filled 2020-10-01 (×3): qty 0.1

## 2020-10-01 MED ORDER — SODIUM CHLORIDE 0.9% IV SOLUTION
Freq: Once | INTRAVENOUS | Status: AC
Start: 2020-10-01 — End: 2020-10-01

## 2020-10-01 NOTE — Progress Notes (Signed)
eLink Physician-Brief Progress Note Patient Name: Dennis Howard. DOB: 1965-06-08 MRN: [id number]   Date of Service  10/01/2020  HPI/Events of Note  Notified of Hgb 6.9, no report of active bleed but did have CRRT clot off twice yesterday   eICU Interventions  Transfuse 1 unit PRBC as with increasing pressor requirement     Intervention Category Major Interventions: Hypotension - evaluation and management  Darl Pikes 10/01/2020, 6:37 AM

## 2020-10-01 NOTE — Progress Notes (Signed)
ANTICOAGULATION CONSULT NOTE  Pharmacy Consult for Bivalirudin Indication: LV mural thrombus  Heparin Dosing Weight:  77.4 kg  Labs: Recent Labs    09/29/20 0313 09/29/20 1031 09/29/20 1539 09/30/20 0309 09/30/20 0731 09/30/20 1237 09/30/20 1549 09/30/20 1606 09/30/20 1656 09/30/20 1819 10/01/20 0441 10/01/20 0442 10/01/20 1230 10/01/20 1307 10/01/20 1837 10/01/20 1838  HGB 7.1*  --   --  7.3*  --   --   --  8.2*  --  9.9* 6.9*  --   --   --   --   --   HCT 23.1*  --   --  23.9*  --   --   --  24.0*  --  29.0* 22.5*  --   --   --   --   --   PLT 82*  --   --  91*  --   --   --   --   --   --  94*  --   --   --   --   --   APTT 42* 48*   < > 49*  --    < >  --   --    < >  --  50*  --  52*  --   --  51*  CREATININE 1.65*  --    < > 1.66*  --   --  1.81*  --   --   --   --  1.38*  --   --  1.20  --   CKTOTAL  --  5,977*  --   --  4,227*  --   --   --   --   --   --   --   --  1,979*  --   --    < > = values in this interval not displayed.   Assessment: 55 yr old man presented with AMS. Pharmacy was originally consulted for heparin for NSTEMI, then continued for discovered LV thrombus. Pt was on no anticoagulants PTA.  -MRI brain on 4/20 with multiple acute infarcts and subacute ischemic changes (target lower end of goal range) -Patient was on IV heparin 4/16 until 4/26 (held intermittently for concerns of bleeding), stopped on 4/26 due to platelets dropping to 21. 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 SRA ordered 4/27, results still in process. Patient is off all heparin and was initiated on bivalirudin.  Bivalirudin preferred over argatroban due to liver dysfunction.  -Patient also on CRRT, s/p PLEX.  APTT therapeutic at 51, however at the lower end of goal.    Goal of Therapy:  Goal aPTT 50-85 (no boluses) -- target lower end of goal ~50-70 given acute infarcts Monitor platelets by anticoagulation protocol: Yes   Plan:  -Continue bivalirudin at  0.15 mg/kg/hr -Monitor q12hour aPTT -Monitor daily CBC, s/sx bleeding   Jeanella Cara, PharmD, [facility] Clinical Pharmacist Please see AMION for all Pharmacists' Contact Phone Numbers 10/01/2020, 7:14 PM

## 2020-10-01 NOTE — Progress Notes (Signed)
eLink Physician-Brief Progress Note Patient Name: Dennis Howard. DOB: [birth year] MRN: [id number]   Date of Service  10/01/2020  HPI/Events of Note  Head CT with acute or early subacute infarct of the left occipital lobe within the PCA territory. Areas of hyperdensity may indicate some petechial hemorrhage.  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

## 2020-10-01 NOTE — Progress Notes (Signed)
Advanced Heart Failure Rounding Note  PCP-Cardiologist: None    Subjective   Went unresponsive overnight and was intubated. Head CT with new stroke left occipital lobe.   Now intubated on high dose NE and DBA. SBP 80s.   Minimally responsive  Co-ox 35%   Objective:   Weight Range: 72.1 kg Body mass index is 21.56 kg/m.   Vital Signs:   Temp:  [96.4 F (35.8 C)-97 F (36.1 C)] 97 F (36.1 C) (05/03 0830) Pulse Rate:  [68-115] 90 (05/03 0900) Resp:  [10-29] 13 (05/03 0900) BP: (68-123)/(21-110) 86/60 (05/03 0900) SpO2:  [98 %-100 %] 100 % (05/03 0900) FiO2 (%):  [30 %-100 %] 30 % (05/03 0754) Weight:  [72.1 kg] 72.1 kg (05/03 0418) Last BM Date: 09/30/20  Weight change: Filed Weights   09/29/20 0500 09/30/20 0500 10/01/20 0418  Weight: 71.9 kg 69.1 kg 72.1 kg    Intake/Output:   Intake/Output Summary (Last 24 hours) at 10/01/2020 0938 Last data filed at 10/01/2020 0900 Gross per 24 hour  Intake 3344.52 ml  Output 2202 ml  Net 1142.52 ml      Physical Exam   General:  Intubated. Minimally responsive HEENT: normal +ETT Neck: supple. LIJ cath Carotids 2+ bilat; no bruits. No lymphadenopathy or thryomegaly appreciated. Cor: PMI nondisplaced. Regular rate & rhythm. No rubs, gallops or murmurs. Lungs: clear Abdomen: soft, nontender, nondistended. No hepatosplenomegaly. No bruits or masses. Good bowel sounds. Extremities: no cyanosis, clubbing, rash, 1-2+ edema Neuro: intubated minimally responsive   Telemetry   Sinus 90-100 Personally reviewed  Labs    CBC Recent Labs    09/30/20 0309 09/30/20 1606 09/30/20 1819 10/01/20 0441  WBC 25.1*  --   --  23.9*  HGB 7.3*   < > 9.9* 6.9*  HCT 23.9*   < > 29.0* 22.5*  MCV 96.0  --   --  95.7  PLT 91*  --   --  94*   < > = values in this interval not displayed.   Basic Metabolic Panel Recent Labs    09/30/20 0309 09/30/20 1549 09/30/20 1606 09/30/20 1819 10/01/20 0441 10/01/20 0442  NA 135 133*    < > 132*  --  135  K 4.4 4.4   < > 4.3  --  4.1  CL 99 99  --   --   --  100  CO2 25 23  --   --   --  22  GLUCOSE 185* 156*  --   --   --  134*  BUN 67* 79*  --   --   --  57*  CREATININE 1.66* 1.81*  --   --   --  1.38*  CALCIUM 7.8* 7.1*  --   --   --  7.4*  MG 2.9*  --   --   --  3.0*  --   PHOS 3.3 3.1  --   --   --  4.4   < > = values in this interval not displayed.   Liver Function Tests Recent Labs    09/29/20 1031 09/29/20 1539 09/30/20 1549 10/01/20 0442  AST 105*  --   --   --   ALT 175*  --   --   --   ALKPHOS 70  --   --   --   BILITOT 1.0  --   --   --   PROT 5.7*  --   --   --  ALBUMIN 3.1*   < > 2.6* 2.5*   < > = values in this interval not displayed.   No results for input(s): LIPASE, AMYLASE in the last 72 hours. Cardiac Enzymes Recent Labs    09/29/20 1031 09/30/20 0731  CKTOTAL 5,977* 4,227*    BNP: BNP (last 3 results) Recent Labs    09/06/2020 2114 09/14/20 0031 09/15/20 0321  BNP 1,447.9* 1,523.7* 743.8*    ProBNP (last 3 results) No results for input(s): PROBNP in the last 8760 hours.   D-Dimer No results for input(s): DDIMER in the last 72 hours. Hemoglobin A1C No results for input(s): HGBA1C in the last 72 hours. Fasting Lipid Panel No results for input(s): CHOL, HDL, LDLCALC, TRIG, CHOLHDL, LDLDIRECT in the last 72 hours. Thyroid Function Tests No results for input(s): TSH, T4TOTAL, T3FREE, THYROIDAB in the last 72 hours.  Invalid input(s): FREET3  Other results:   Imaging    CT HEAD WO CONTRAST  Result Date: 09/30/2020 CLINICAL DATA:  Acute encephalopathy EXAM: CT HEAD WITHOUT CONTRAST TECHNIQUE: Contiguous axial images were obtained from the base of the skull through the vertex without intravenous contrast. COMPARISON:  09/19/2020 FINDINGS: Brain: There is an acute or early subacute infarct of the left occipital lobe within the PCA territory. Areas of hyperdensity may indicate some petechial hemorrhage. There is no  midline shift or hydrocephalus. There is periventricular hypoattenuation compatible with chronic microvascular disease. Vascular: No abnormal hyperdensity of the major intracranial arteries or dural venous sinuses. No intracranial atherosclerosis. Skull: The visualized skull base, calvarium and extracranial soft tissues are normal. Sinuses/Orbits: Right maxillary retention cysts. The orbits are normal. IMPRESSION: Acute or early subacute infarct of the left occipital lobe within the PCA territory. Areas of hyperdensity may indicate some petechial hemorrhage. Electronically Signed   By: Ulyses Jarred M.D.   On: 09/30/2020 21:53   DG CHEST PORT 1 VIEW  Result Date: 09/30/2020 CLINICAL DATA:  Acute respiratory failure with hypoxemia EXAM: PORTABLE CHEST 1 VIEW COMPARISON:  09/23/2020 chest radiograph. FINDINGS: Endotracheal tube tip is 4.8 cm above the carina. Enteric tube enters stomach with the tip in the body of the stomach. Left internal jugular central venous catheter terminates in the middle third of the SVC. Right internal jugular central venous catheter terminates at the cavoatrial junction. Stable cardiomediastinal silhouette with mild cardiomegaly. No pneumothorax. No pleural effusion. Lungs appear clear, with no acute consolidative airspace disease and no pulmonary edema. IMPRESSION: 1. Well-positioned support structures. No pneumothorax. 2. Stable mild cardiomegaly without pulmonary edema. No active pulmonary disease. Electronically Signed   By: Ilona Sorrel M.D.   On: 09/30/2020 17:58     Medications:     Scheduled Medications: . capsaicin   Topical BID  . chlorhexidine gluconate (MEDLINE KIT)  15 mL Mouth Rinse BID  . Chlorhexidine Gluconate Cloth  6 each Topical Daily  . feeding supplement  237 mL Oral BID BM  . gabapentin  100 mg Oral Daily  . insulin aspart  0-20 Units Subcutaneous Q4H  . insulin aspart  4 Units Subcutaneous Q4H  . insulin detemir  15 Units Subcutaneous BID  .  lidocaine   Topical BID  . magic mouthwash  10 mL Oral TID  . mouth rinse  15 mL Mouth Rinse 10 times per day  . multivitamin with minerals  1 tablet Per Tube Daily  . pantoprazole  40 mg Intravenous Q12H  . polyethylene glycol  17 g Per Tube Daily  . sodium chloride flush  10-40  mL Intracatheter Q12H  . thiamine injection  100 mg Intravenous Daily    Infusions: . sodium chloride 10 mL/hr at 10/01/20 0700  . bivalirudin (ANGIOMAX) infusion 0.5 mg/mL (Non-ACS indications) 0.15 mg/kg/hr (10/01/20 0830)  . citrate dextrose    . DOBUTamine 6 mcg/kg/min (10/01/20 0700)  . feeding supplement (PIVOT 1.5 CAL) 1,000 mL (09/29/20 2300)  . fentaNYL infusion INTRAVENOUS 50 mcg/hr (10/01/20 0700)  . methylPREDNISolone (SOLU-MEDROL) injection Stopped (10/01/20 0204)  . norepinephrine (LEVOPHED) Adult infusion 35 mcg/min (10/01/20 0800)  . prismasol BGK 2/2.5 dialysis solution 1,500 mL/hr at 10/01/20 0800  . prismasol BGK 2/2.5 replacement solution 500 mL/hr at 10/01/20 0800  . prismasol BGK 2/2.5 replacement solution 300 mL/hr at 09/30/20 1033    PRN Medications: sodium chloride, acetaminophen (TYLENOL) oral liquid 160 mg/5 mL, diphenhydrAMINE, docusate, fentaNYL, oxyCODONE, sodium chloride flush    Patient Profile   55 y/o male w/ no prior cardiac history, admitted for mixed cardiogenic + septic shock in setting of PNA + hepatic encephalopathy.   ECHO LVEF 15% with severe global HK with regional variability + LV clot RV moderately down. Initial Co-ox 48%. Started on DBA.    Assessment/Plan   1. Cardiogenic shock with multisystem organ failure - ECHO LVEF 15% with severe global HK with regional variability + LV clot RV moderately down. - Remains on DBA 6.5. NE 15 --> CO-OX 40% -> 45% -> 55% - On CVVHD for volume removal. Weight down 24 pounds. Nearing euvolemia - Unifying diagnosis remains unclear. Serologies unrevealing so far. Preliminary muscle bx results nonspecific  - Weakness and  MSOF well out of proportion to HF.  - He has responded to PLEX, stopped 4/29.  - Essentially arrested overnight and now with new CVA and on high-dose pressors. Co-ox 35% - I think he is end-stage and there is no chance for meaningful survival unfortunately.  - Would advocate strongly for comfort care. Palliative meeting later today. D/w CCM team at bedside  2. LV thrombus - On bival  3. Severe myositis/rhabdo - Serologies unrevealing so far. Preliminary muscle bx results nonspecific  - Neuro and onc have seen - He has responded to PLEX. - See plan as above   4. AKI due to ATN/shock - Baseline creatinine unknown  - SCr 1.5 in 2016 in Care Everywhere - Started CVVHD 4/25.  - Per Nephrology   5. Acute hypoxic respiratory failure - plan as above  6. 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 Please call us with questions.   CRITICAL CARE Performed by: Glori Bickers  Total critical care time: 35 minutes  Critical care time was exclusive of separately billable procedures and treating other patients.  Critical care was necessary to treat or prevent imminent or life-threatening deterioration.  Critical care was time spent personally by me (independent of midlevel providers or residents) on the following activities: development of treatment plan with patient and/or surrogate as well as nursing, discussions with consultants, evaluation of patient's response to treatment, examination of patient, obtaining history from patient or surrogate, ordering and performing treatments and interventions, ordering and review of laboratory studies, ordering and review of radiographic studies, pulse oximetry and re-evaluation of patient's condition.    Length of Stay: 30  Glori Bickers, MD  10/01/2020, 9:38 AM  Advanced Heart Failure Team Pager [phone] (M-F; 7a - 5p)  Please contact [facility] for night-coverage after hours (5p -7a ) and weekends on  [email or web address]

## 2020-10-01 NOTE — Progress Notes (Addendum)
IP PROGRESS NOTE  Subjective: The patient is now intubated.  Family meeting scheduled for this afternoon to discuss goals of care.  Objective: Vital signs in last 24 hours: Blood pressure 104/76, pulse 98, temperature (!) 96.1 F (35.6 C), temperature source Axillary, resp. rate 13, height 6' (1.829 m), weight 72.1 kg, SpO2 100 %.  Intake/Output from previous day: 05/02 0701 - 05/03 0700 In: 3314 [P.O.:298; I.V.:1385.1; NG/GT:1385; IV Piggyback:220.9] Out: 2667   Physical Exam:  HEENT: ETT in place. Cortrack in place. Abdomen: Soft, no hepatosplenomegaly, nontender Extremities: Trace edema at the lower leg bilaterally Musculoskeletal: There is swelling at the left mid and upper thigh Neurologic: Sedated Skin: No ecchymoses  Right and left IJ catheter sites without evidence of infection or bleeding  Lab Results: Recent Labs    09/30/20 0309 09/30/20 1606 09/30/20 1819 10/01/20 0441  WBC 25.1*  --   --  23.9*  HGB 7.3*   < > 9.9* 6.9*  HCT 23.9*   < > 29.0* 22.5*  PLT 91*  --   --  94*   < > = values in this interval not displayed.  ADAMTS13 on 09/17/2020 - 38.2% Blood smear 09/24/2020: The platelets are decreased in number, no platelet clumps.  There is a leukocytosis.  The majority the white cells are mature neutrophils.  No blast or other young forms are seen.  Increased nucleated red cells.  The polychromasia is increased.  There are burr cells, target cells, acanthocytes, few ovalocytes, and a few schistocytes. BMET Recent Labs    09/30/20 1549 09/30/20 1606 09/30/20 1819 10/01/20 0442  NA 133*   < > 132* 135  K 4.4   < > 4.3 4.1  CL 99  --   --  100  CO2 23  --   --  22  GLUCOSE 156*  --   --  134*  BUN 79*  --   --  57*  CREATININE 1.81*  --   --  1.38*  CALCIUM 7.1*  --   --  7.4*   < > = values in this interval not displayed.   Bilirubin 1.6, LDH 594 No results found for: CEA1  Studies/Results: CT HEAD WO CONTRAST  Result Date: 09/30/2020 CLINICAL  DATA:  Acute encephalopathy EXAM: CT HEAD WITHOUT CONTRAST TECHNIQUE: Contiguous axial images were obtained from the base of the skull through the vertex without intravenous contrast. COMPARISON:  09/19/2020 FINDINGS: Brain: There is an acute or early subacute infarct of the left occipital lobe within the PCA territory. Areas of hyperdensity may indicate some petechial hemorrhage. There is no midline shift or hydrocephalus. There is periventricular hypoattenuation compatible with chronic microvascular disease. Vascular: No abnormal hyperdensity of the major intracranial arteries or dural venous sinuses. No intracranial atherosclerosis. Skull: The visualized skull base, calvarium and extracranial soft tissues are normal. Sinuses/Orbits: Right maxillary retention cysts. The orbits are normal. IMPRESSION: Acute or early subacute infarct of the left occipital lobe within the PCA territory. Areas of hyperdensity may indicate some petechial hemorrhage. Electronically Signed   By: Deatra Robinson M.D.   On: 09/30/2020 21:53   DG CHEST PORT 1 VIEW  Result Date: 09/30/2020 CLINICAL DATA:  Acute respiratory failure with hypoxemia EXAM: PORTABLE CHEST 1 VIEW COMPARISON:  09/23/2020 chest radiograph. FINDINGS: Endotracheal tube tip is 4.8 cm above the carina. Enteric tube enters stomach with the tip in the body of the stomach. Left internal jugular central venous catheter terminates in the middle third of the SVC. Right  internal jugular central venous catheter terminates at the cavoatrial junction. Stable cardiomediastinal silhouette with mild cardiomegaly. No pneumothorax. No pleural effusion. Lungs appear clear, with no acute consolidative airspace disease and no pulmonary edema. IMPRESSION: 1. Well-positioned support structures. No pneumothorax. 2. Stable mild cardiomegaly without pulmonary edema. No active pulmonary disease. Electronically Signed   By: Delbert Phenix M.D.   On: 09/30/2020 17:58    Medications: I have  reviewed the patient's current medications.  Assessment/Plan: 1.  Pulmonary infiltrates 2.  Chronic/acute renal failure 3.  Cardiogenic shock on hospital admission 4.  Liver failure secondary to cardiogenic shock, underlying chronic liver disease? 5.  Anemia 6.  Thrombocytopenia  Daily plasmapheresis 09/24/2020  Solu-Medrol 09/24/2020 7.   Coagulopathy 8.   Altered mental status-hepatic encephalopathy? 9.   Rhabdomyolysis 10.  Oral candidiasis 11.  Muscle weakness-status post a muscle biopsy 09/06/2020 12.  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  Positive HIT antibody 09/24/2020, serotonin release assay pending  Dennis Howard is now intubated.  The patient has possible new CVA noted on scan.  Family meeting scheduled for this afternoon to discuss goals of care.  Remains critically ill with multiorgan failure.  The patient is more anemic today and received a PRBC transfusion.  Platelets remain stable.  He remains on steroids.  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  SRA pending.  Currently receiving Angiomax.  The muscle biopsy preliminary was reported as focal inflammation which was not abundant, some minor changes of myositis, but not conclusive.  Final pathology is still pending.  We will follow-up on the final pathology result.  Recommendations:  1.  Transfuse packed red blood cells as needed 2.  Daily CBC 3.  We will follow-up on final muscle biopsy results. 4.  Steroids as recommended by the critical care  5.  CRRT per Nephrology.  6.  Follow-up on SRA.  7.  Goals of care discussion today per palliative care and critical care team.    LOS: 18 days   I saw Dennis Howard at approximately 6:30 AM.  The events of yesterday were reviewed.  He is now intubated.  He developed decreased responsiveness and hypotension yesterday.  The platelet count remains improved.  The serotonin release assay is still pending.  The final  pathology from the muscle biopsy is pending.  He remains critically ill with multiorgan failure after presenting with rhabdomyolysis.  The acute CVA may explain the change in his mental status yesterday.  He continues to require pressor support secondary to severe heart failure.  I agree with the poor prognosis as outlined by the critical care team and other consultants.  I am available to speak with his family as needed.

## 2020-10-01 NOTE — Progress Notes (Signed)
eLink Physician-Brief Progress Note Patient Name: Dennis Howard. DOB: 1965-12-29 MRN: [id number]   Date of Service  10/01/2020  HPI/Events of Note  Patient with increase in white cell count to 36 K from 23 K, he has a central line but it cannot be discontinued due to high dose pressors infusing, he also has a dialysis catheter with CRRT in process.  eICU Interventions  Given patient's status as 'no further escalation of care' and likely imminent transition to comfort measures, will defer removing the central lines to the daytime attending physician, and will not order additional interventions tonight.        Thomasene Lot Gerilyn Stargell 10/01/2020, 9:09 PM

## 2020-10-01 NOTE — Progress Notes (Signed)
EEG complete - results pending 

## 2020-10-01 NOTE — Progress Notes (Signed)
Brief Progress  Family discussion: Pt family Goals of care discussion with patient's father and mother.Helmut Muster From Palliative care, Myself and JD Suzie Portela PA.  We discussed that the patient has had further decline overnight. We discussed that he has had a New acute vs subacute infarct of the L occipital lobe, within PCA territory . Additionally HGB dropped to 6.9 overnight, which is requiring transfusion, and anticoagulation needs to be continued at present time with close monitoring of HGB and neuro status.   Additionally we discussed that his heart failure has worsening ( Co-Ox of 35%) with  increasing pressor demands. Currently on Levo at 35 , Dobutamine at 6 mcg/kg . Heart failure team has nothing further to offer , and recommend comfort care.  Patient's mother is at peace and understands it may be God;s plan that Banyan's life may be coming to an end despite aggressive care.  Patient's father would like [place] to give him another day with current level of care, no escalation of care.   We did discuss concerns that Jared may be uncomfortable, and that waiting another 24 hours will not change outcome, but may impact comfort.   Additionally we discussed that patient is declaring himself as evidenced by further organ failure over night.   Plan We will meet again tomorrow to revisit goals of care, comfort care.Current care will remain a t current level of care. No escalation of care.  He is a partial Code. Intubation only Patient may declare himself prior to that meeting, as he appears to be actively dying despite out aggressive measures of support. Bevelyn Ngo, MSN, AGACNP-BC [facility] Pulmonary/Critical Care Medicine See Amion for personal pager PCCM on call pager [phone] 10/01/2020

## 2020-10-01 NOTE — Progress Notes (Signed)
OT Cancellation Note  Patient Details Name: Dennis Howard. MRN: [id number] DOB: 1965-10-28   Cancelled Treatment:    Reason Eval/Treat Not Completed: Patient not medically ready (Pt unresponsive overnight requiring intubation and new strokes found on CT head. OT hold as pt not medically appropriate at this time. Palliative consult in place as pt's prognosis is guarded.)   Flora Lipps, OTR/L Acute Rehabilitation Services Pager: [phone] Office: [phone]   Rathana Viveros C 10/01/2020, 1:20 PM

## 2020-10-01 NOTE — Progress Notes (Signed)
Palliative:  HPI: 55 y.o. male  with past medical history of chronic kidney disease stage 3, hypertension, bronchitis admitted on 09/16/2020 with altered mental status with work up of possible myopathy/myositis and awaiting muscle biopsy results. Hospitalization further complicated by EF 75% with cardiogenic shock requiring dobutamine and norepi, CRRT, HIT, acute embolic stroke. Palliative care requested to discuss goals of care due to poor progression with ongoing severe debility and multiorgan failure.  I met today with Marquail's mother and father, Lelan Pons and Sanuel, along with Eric Form, NP PCCM and we had a frank discussion regarding Gabryel's condition. We explained that Raju has had significant decline since yesterday. We discussed that he has had a stroke, severe heart failure with no more options, kidney failure and dialysis not able to pull fluid and be effective. I told them that Avelardo is at the end of his life and there is nothing we can do to alter this because his body is failing and he is dying. I expressed that I am sorry to share this with them but we need to consider what is best for Reginold and the risks vs benefits of what we are asking him to go through. I asked that they consider focusing on keeping Dezmon comfortably at the end of his life. Lelan Pons looks to [place] for [employer]. Lelan Pons is at peace with what is meant to be and trusts God to do what is best for her son. Kanen requests that we continue efforts for one more day and we can re-discuss tomorrow. We were clear that we would not be surprised if Fran died even despite ongoing efforts. They should prepare themselves and their family.   All questions/concerns addressed. Emotional support provided.   Exam: Sedated on vent. No distress. CRRT. HR stable. Dobutamine and norepi gtts going.   Plan: - Father requests to continue aggressive care one more day. We plan to speak more tomorrow.  - I encouraged family to prepare themselves that Jasiyah is  dying.   25 min  Vinie Sill, NP Palliative Medicine Team Pager [phone] (Please see [email or web address] for schedule) Team Phone [phone]    Greater than 50%  of this time was spent counseling and coordinating care related to the above assessment and plan

## 2020-10-01 NOTE — Progress Notes (Addendum)
NAME:  Dennis Bloodworth., MRN:  [id number], DOB:  [birth year], LOS: 18 ADMISSION DATE:  10/12/2020, CONSULTATION DATE: Oct 12, 2020 REFERRING MD: Wilkie Aye - EM CHIEF COMPLAINT:  Altered Mental Status  History of Present Illness:  55 yo male former smoker presented with altered mental status that progressed for one week prior to admission.  This was associated with lower leg swelling with blisters, weight gain, dyspnea, and cough.  Also had progressive muscle weakness.  Had recent outpt treatment for pneumonia with doxycycline.  Pertinent  Medical History  CKD III - sr cr 2013 1.6, HTN, Bronchitis, THC Use    Significant Hospital Events: Including procedures, antibiotic start and stop dates in addition to other pertinent events   . 4/15 Admitted to ICU. Treated with azithro, ceftriaxone, flagyl, unasyn. Tylenol, acetaminophen, ETOH, influenza, COVID, hepatitis, urine strep antigen, legionella, & UDS negative.  . 4/16 ECHO with global hypokinesis, anteroseptal wall and apex are akinetic, anterior wall severely hypokinetic, very large layered semi-mobile apical thrombus extending from the apex along the anteroseptal wall, LVEF ~15-20%, RV systolic function mild to moderately reduced.  tarted on dobuatmine drip. ANA negative. Blood cultures negative.  . 4/17 Unasyn stopped, cefepime initiated  . 4/18 Dobutamine at , net neg 1.7L, tolerating soft diet, no n/v . 4/19 Dobutamine increased to 7.59mcg . 4/20 MRI Brain >> 77mm acute infarction at the inferior cerebellum on left and 57mm acute infarction at the left parietooccipital junction. Subacute ischemic changes of the cortical subcortical brain. . 4/22 Thigh hematoma post biopsy . 4/23 Dobutamine at 6.51mcg, pt on heparin gtt, PPI gtt. Muscle biopsy pending.  . 4/24 dark colored bowel movement, hemoglobin stable . 4/25 CK 32K, CRRT started . 4/26 PLEX started yesterday. . 4/30:Stronger, tolerating orally . 5/1: PLEX completed 4/30. Decrease norepi due  to purple dusky toes. . 5/2 drop in coox on lower NE> rate increased again>> Intubated for MS change . 5/3: Co ox 35, New acute vs subacute infarct of the L occipital lobe, within PCA territory per CT Head 5/2, remains on CVVHD, keeping even as on 35 of Levo at present. Dobutamine of 6 mcg.kg./ min, Fentanyl 50 mcg/hr , Bivalirudin at 0.15 mg/kg/hr  Interim History / Subjective:  Sedated and intubated  Currently on Levo, Dobutamine , Bivalirudin On CVVHD and keeping even as high pressor support Net + 647 cc's last 24, net - 12 L since admission Co-ox is 35, CVP is 2 Orders for 1 unit PRBC's for HGB of 6.9 WBC is 23.9, platelets are 29,000 Creatinine 1.38 For Palliative meeting today to discuss goals of care  Objective   Blood pressure (!) 86/68, pulse 87, temperature (!) 96.4 F (35.8 C), temperature source Axillary, resp. rate 12, height 6' (1.829 m), weight 72.1 kg, SpO2 99 %. CVP:  [0 mmHg-39 mmHg] 8 mmHg  Vent Mode: PRVC FiO2 (%):  [30 %-100 %] 30 % Set Rate:  [12 bmp-16 bmp] 12 bmp Vt Set:  [500 mL-620 mL] 620 mL PEEP:  [5 cmH20] 5 cmH20 Plateau Pressure:  [13 cmH20-18 cmH20] 18 cmH20   Intake/Output Summary (Last 24 hours) at 10/01/2020 9373 Last data filed at 10/01/2020 0700 Gross per 24 hour  Intake 3159.64 ml  Output 2433 ml  Net 726.64 ml   Filed Weights   09/29/20 0500 09/30/20 0500 10/01/20 0418  Weight: 71.9 kg 69.1 kg 72.1 kg    Examination:  General: Ill-appearing middle-aged man sedated and intubated, in NAD Neuro:Sedated, follows no commands, does not arouse to stimulation  HEENT: [place]/AT, eyes anicteric, ETT secure and in place, [place] Neck: LIJ trialysis catheter without erythema, CVVHD ongoing Cardiovascular: SR, reg rhythm, S1, S2, No RMG, ST depression per Tele Lungs: bilateral chest excursion, CTAB, decreased basilar breath sounds. Abdomen: Soft, nontender, nondistended.  Hypoactive bowel sounds. Musculoskeletal: Mild dependent lower extremity edema and  distal ankle edema. Skin: Warm, dry, no rash. lesions   Resolved Hospital Problem list   Encephalopathy  Assessment & Plan:   Diffuse muscle weakness with elevated CPK and aldolase- concern for myopathy or myositis.  Work-up to date has been negative. -Muscle biopsy results still pending. -Completed 5 days of Plex treatments.  Previously had no response to steroids.  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 HGB drop to 6.9 on 5/3 -transfuse for Hgb <7 or hemodynamically significant bleeding -Continue full anticoagulation-bivalirudin -due to concern for GIB earlier this admission, will con't high dose PPI (steroids, AC ongoing) - Will continue bivalirudin, will transfuse , will check CBC and PT at 1700 on 5/3  Hyperglycemia, controlled>> now normalizing with decrease in steroids -Continue basal bolus insulin, but will decrease to 10 u  BID from 15 U BID -SSI as needed - Goal BG 140-180  Acute embolic CVA New acute vs subacute infarct of the L occipital lobe, within PCA territory 5/2  -Eventually needs MRI when more stable -Reinvolve neurology when more stable from cardiac standpoint - Palliation and Goals of care conversations ongoing as progressive decompensation/ multiorgan failure  Acute kidney injury on chronic kidney disease Rhabdomyolysis -CRRT per nephrology, appreciate their recommendations - Trend BMET - Renally dose meds, avoid nephrotoxic meds - Maintain renal perfusion  Elevated transaminase levels, potentially due to muscle breakdown.  Normal bilirubin.  Albumin minimally decreased, likely multifactorial.  Been previously minimally elevated Congestive hepatopathy -Continue to trend LFTs  periodically  Thrush -Treated  Pain -Low-dose Neurontin  -Continue oxycodone as needed - Continue Fentanyl IV for now  [facility] -Palliative Care consult 5/2, and Goals of Care meeting 5/3 - concerned that this may not have a correctable cause as pt has failed to significantly improve on steroids and PLEX. Now with New acute vs sub acute infarct, inability to wean down  Inotropes, worsening Co-ox , increased pressor requirements and isn't a candidate for mechanical support. Poor prognosis.Comfort Care will be discussed  Best practice (right click and "Reselect all SmartList Selections" daily)  Diet:  Tube Feed , Mechanical soft diet  pain/Anxiety/Delirium protocol (if indicated): No VAP protocol (if indicated): Not indicated DVT prophylaxis: Systemic AC GI prophylaxis: PPI BID Glucose control:  SSI Yes and Basal insulin Yes Central venous access:  Yes, and it is still needed Arterial line:  N/A Foley:  Yes, and it is still needed,  Mobility:  bed rest  PT consulted: N/A Last date of multidisciplinary goals of care discussion - ongoing Code Status:  full code Disposition: ICU  Labs:   CMP Latest Ref Rng & Units 10/01/2020 09/30/2020 09/30/2020  Glucose 70 - 99 mg/dL 573(U) - -  BUN 6 - 20 mg/dL 20(U) - -  Creatinine 5.42 - 1.24 mg/dL 7.06(C) - -  Sodium 376 - 145 mmol/L 135 132(L) 132(L)  Potassium 3.5 - 5.1 mmol/L 4.1 4.3 4.5  Chloride 98 - 111 mmol/L 100 - -  CO2 22 - 32 mmol/L 22 - -  Calcium 8.9 - 10.3 mg/dL 7.4(L) - -  Total Protein 6.5 - 8.1 g/dL - - -  Total Bilirubin 0.3 - 1.2 mg/dL - - -  Alkaline Phos 38 - 126 U/L - - -  AST 15 - 41 U/L - - -  ALT 0 - 44 U/L - - -    CBC Latest Ref Rng & Units 10/01/2020 09/30/2020 09/30/2020  WBC 4.0 - 10.5 K/uL 23.9(H) - -  Hemoglobin 13.0 - 17.0 g/dL 6.9(LL) 9.9(L) 8.2(L)  Hematocrit 39.0 - 52.0 % 22.5(L) 29.0(L) 24.0(L)  Platelets 150 - 400 K/uL 94(L) - -    ABG    Component Value Date/Time   PHART 7.537 (H) 09/30/2020 1819    PCO2ART 32.3 09/30/2020 1819   PO2ART 584 (H) 09/30/2020 1819   HCO3 27.5 09/30/2020 1819   TCO2 28 09/30/2020 1819   ACIDBASEDEF 1.8 09/14/2020 0031   O2SAT 35.0 10/01/2020 0418    CBG (last 3)  Recent Labs    09/30/20 2337 10/01/20 0329 10/01/20 0749  GLUCAP 104* 151* 94   This patient is critically ill with multiple organ system failure which requires frequent high complexity decision making, assessment, support, evaluation, and titration of therapies. This was completed through the application of advanced monitoring technologies and extensive interpretation of multiple databases. During this encounter critical care time was devoted to patient care services described in this note for 40 minutes.   Bevelyn Ngo, NP 10/01/20 8:28 AM [facility]

## 2020-10-01 NOTE — Progress Notes (Addendum)
ANTICOAGULATION CONSULT NOTE  Pharmacy Consult for Bivalirudin Indication: LV mural thrombus  Heparin Dosing Weight:  77.4 kg  Labs: Recent Labs    09/29/20 0313 09/29/20 1031 09/29/20 1539 09/30/20 0309 09/30/20 0731 09/30/20 1237 09/30/20 1549 09/30/20 1606 09/30/20 1656 09/30/20 1819 10/01/20 0441 10/01/20 0442  HGB 7.1*  --   --  7.3*  --   --   --  8.2*  --  9.9* 6.9*  --   HCT 23.1*  --   --  23.9*  --   --   --  24.0*  --  29.0* 22.5*  --   PLT 82*  --   --  91*  --   --   --   --   --   --  94*  --   APTT 42* 48*   < > 49*  --  57*  --   --  56*  --  50*  --   CREATININE 1.65*  --    < > 1.66*  --   --  1.81*  --   --   --   --  1.38*  CKTOTAL  --  5,977*  --   --  4,227*  --   --   --   --   --   --   --    < > = values in this interval not displayed.   Assessment: 55 yr old man presented with AMS. Pharmacy was originally consulted for heparin for NSTEMI, then continued for discovered LV thrombus. Pt was on no anticoagulants PTA.  -MRI brain on 4/20 with multiple acute infarcts and subacute ischemic changes (target lower end of goal range) -Patient was on IV heparin 4/16 until 4/26 (held intermittently for concerns of bleeding), stopped on 4/26 due to platelets dropping to 21. 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 SRA ordered 4/27, results still in process. Patient is off all heparin and was initiated on bivalirudin.  Bivalirudin preferred over argatroban due to liver dysfunction.  -Patient also on CRRT, s/p PLEX.  APTT therapeutic at 50, however at the lower end of goal. Acute drop in hemoglobin to 6.9, plt up to 94 today. No active bleed issues reported-receiving 1 unit of PRBC.  Head CT yesterday afternoon showed new acute infarcts.  Will increase bivalirudin drip per protocol at 20% and recheck aPTT in 4 hours.   Goal of Therapy:  Goal aPTT 50-85 (no boluses) -- target lower end of goal ~50-70 given acute infarcts Monitor platelets by  anticoagulation protocol: Yes   Plan:  -Increase bivalirudin to 0.15 mg/kg/hr -Check aPTT 4 hours after drip increase -Monitor daily CBC, s/sx bleeding   PM UPDATES: -aPTT 4 hours after rate change therapeutic at 52. No bleeding noted. -Continue bivalirudin at 0.15 mg/kg/hr -Monitor q12hour aPTT  Rexford Maus, PharmD PGY-1 Acute Care Pharmacy Resident 10/01/2020 2:05 PM

## 2020-10-01 NOTE — Progress Notes (Signed)
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    #Acute kidney injury on CKD stage III multifactorial etiology including ischemic ATN due to cardiogenic/septic shock, severe CHF with poor renal perfusion, rhabdomyolysis.  Worsened in past 24h, on inotrope and pressors to attempt to maintain blood pressure.  CRRT start 4/25.  All 2K bath. Net even at best right now.  Angiomax for [facility].  Cotn current stettings. Very poor prognosis.   #Acute metabolic encephalopathy, acute CVAs.  Worsenined in past 24h.   #Acute systolic CHF with cardiogenic shock, LV thrombus: Seen by cardiologist.  Currently on dobutamine, angiomax.  Not improving.  #Pulmonary infiltrates, pneumonia: Completed antibiotics.  #Diffuse muscle weakness with elevated CPK, nontraumatic rhabdomyolysis status post muscle biopsy pending final results.  Currently on IV Solu-Medrol and plasma exchange by hematologist.   #Multiorgan failure including congestive hepatopathy, renal failure, CHF, stroke.  Doubt he can survive.  GOC / Palliative following.   #VDRF  Subjective:   Resp failure overnight, req intubation  CT Head new L occiptal infarct  GOC meeting later today  K 4.1, P 4.4  Remains  on CRRT  Remains on NE and DBA; hypotensive    Objective Vital signs in last 24 hours: Vitals:   10/01/20 0800 10/01/20 0830 10/01/20 0845 10/01/20 0900  BP: (!) 68/44 (!) 88/62 (!) 84/65 (!) 86/60  Pulse: 89 90 92 90  Resp: $Remo'17 13 17 13  'cPFFt$ Temp:  (!) 97 F (36.1 C)    TempSrc:  Oral    SpO2: 100% 100% 100% 100%  Weight:      Height:       Weight change: 3 kg  Intake/Output Summary (Last 24 hours) at 10/01/2020 0950 Last data filed at 10/01/2020 0900 Gross per 24 hour  Intake 3344.52 ml  Output 2202 ml  Net 1142.52 ml       Labs: Basic Metabolic Panel: Recent Labs  Lab 09/30/20 0309 09/30/20 1549  09/30/20 1606 09/30/20 1819 10/01/20 0442  NA 135 133* 132* 132* 135  K 4.4 4.4 4.5 4.3 4.1  CL 99 99  --   --  100  CO2 25 23  --   --  22  GLUCOSE 185* 156*  --   --  134*  BUN 67* 79*  --   --  57*  CREATININE 1.66* 1.81*  --   --  1.38*  CALCIUM 7.8* 7.1*  --   --  7.4*  PHOS 3.3 3.1  --   --  4.4   Liver Function Tests: Recent Labs  Lab 09/25/20 0315 09/25/20 0759 09/26/20 0320 09/26/20 1551 09/29/20 1031 09/29/20 1539 09/30/20 0309 09/30/20 1549 10/01/20 0442  AST 282*  --  271*  --  105*  --   --   --   --   ALT 288*  --  247*  --  175*  --   --   --   --   ALKPHOS 100  --  88  --  70  --   --   --   --   BILITOT 1.7*  --  1.4*  --  1.0  --   --   --   --   PROT 5.3*  --  5.6*  --  5.7*  --   --   --   --   ALBUMIN 2.8*   < > 3.1*  3.0*   < >  3.1*   < > 2.8* 2.6* 2.5*   < > = values in this interval not displayed.   No results for input(s): LIPASE, AMYLASE in the last 168 hours. No results for input(s): AMMONIA in the last 168 hours. CBC: Recent Labs  Lab 09/27/20 0301 09/28/20 0301 09/29/20 0313 09/30/20 0309 09/30/20 1606 09/30/20 1819 10/01/20 0441  WBC 26.1* 25.5* 26.2* 25.1*  --   --  23.9*  NEUTROABS 23.7*  --   --   --   --   --   --   HGB 7.4* 7.1* 7.1* 7.3* 8.2* 9.9* 6.9*  HCT 24.0* 23.9* 23.1* 23.9* 24.0* 29.0* 22.5*  MCV 96.8 99.2 97.5 96.0  --   --  95.7  PLT 35* 55* 82* 91*  --   --  94*   Cardiac Enzymes: Recent Labs  Lab 09/26/20 0320 09/27/20 0301 09/28/20 0301 09/29/20 1031 09/30/20 0731  CKTOTAL 25,775* 14,656* 7,402* 5,977* 4,227*   CBG: Recent Labs  Lab 09/30/20 1934 09/30/20 2214 09/30/20 2337 10/01/20 0329 10/01/20 0749  GLUCAP 76 104* 104* 151* 94    Iron Studies: No results for input(s): IRON, TIBC, TRANSFERRIN, FERRITIN in the last 72 hours. Studies/Results: CT HEAD WO CONTRAST  Result Date: 09/30/2020 CLINICAL DATA:  Acute encephalopathy EXAM: CT HEAD WITHOUT CONTRAST TECHNIQUE: Contiguous axial images were  obtained from the base of the skull through the vertex without intravenous contrast. COMPARISON:  09/19/2020 FINDINGS: Brain: There is an acute or early subacute infarct of the left occipital lobe within the PCA territory. Areas of hyperdensity may indicate some petechial hemorrhage. There is no midline shift or hydrocephalus. There is periventricular hypoattenuation compatible with chronic microvascular disease. Vascular: No abnormal hyperdensity of the major intracranial arteries or dural venous sinuses. No intracranial atherosclerosis. Skull: The visualized skull base, calvarium and extracranial soft tissues are normal. Sinuses/Orbits: Right maxillary retention cysts. The orbits are normal. IMPRESSION: Acute or early subacute infarct of the left occipital lobe within the PCA territory. Areas of hyperdensity may indicate some petechial hemorrhage. Electronically Signed   By: Ulyses Jarred M.D.   On: 09/30/2020 21:53   DG CHEST PORT 1 VIEW  Result Date: 09/30/2020 CLINICAL DATA:  Acute respiratory failure with hypoxemia EXAM: PORTABLE CHEST 1 VIEW COMPARISON:  09/23/2020 chest radiograph. FINDINGS: Endotracheal tube tip is 4.8 cm above the carina. Enteric tube enters stomach with the tip in the body of the stomach. Left internal jugular central venous catheter terminates in the middle third of the SVC. Right internal jugular central venous catheter terminates at the cavoatrial junction. Stable cardiomediastinal silhouette with mild cardiomegaly. No pneumothorax. No pleural effusion. Lungs appear clear, with no acute consolidative airspace disease and no pulmonary edema. IMPRESSION: 1. Well-positioned support structures. No pneumothorax. 2. Stable mild cardiomegaly without pulmonary edema. No active pulmonary disease. Electronically Signed   By: Ilona Sorrel M.D.   On: 09/30/2020 17:58    Medications: Infusions: . sodium chloride 10 mL/hr at 10/01/20 0700  . bivalirudin (ANGIOMAX) infusion 0.5 mg/mL  (Non-ACS indications) 0.15 mg/kg/hr (10/01/20 0830)  . citrate dextrose    . DOBUTamine 6 mcg/kg/min (10/01/20 0700)  . feeding supplement (PIVOT 1.5 CAL) 1,000 mL (09/29/20 2300)  . fentaNYL infusion INTRAVENOUS 50 mcg/hr (10/01/20 0700)  . methylPREDNISolone (SOLU-MEDROL) injection Stopped (10/01/20 0204)  . norepinephrine (LEVOPHED) Adult infusion 35 mcg/min (10/01/20 0800)  . prismasol BGK 2/2.5 dialysis solution 1,500 mL/hr at 10/01/20 0800  . prismasol BGK 2/2.5 replacement solution 500 mL/hr at 10/01/20 0800  .  prismasol BGK 2/2.5 replacement solution 300 mL/hr at 09/30/20 1033    Scheduled Medications: . capsaicin   Topical BID  . chlorhexidine gluconate (MEDLINE KIT)  15 mL Mouth Rinse BID  . Chlorhexidine Gluconate Cloth  6 each Topical Daily  . feeding supplement  237 mL Oral BID BM  . gabapentin  100 mg Oral Daily  . insulin aspart  0-20 Units Subcutaneous Q4H  . insulin aspart  4 Units Subcutaneous Q4H  . insulin detemir  15 Units Subcutaneous BID  . lidocaine   Topical BID  . magic mouthwash  10 mL Oral TID  . mouth rinse  15 mL Mouth Rinse 10 times per day  . multivitamin with minerals  1 tablet Per Tube Daily  . pantoprazole  40 mg Intravenous Q12H  . polyethylene glycol  17 g Per Tube Daily  . sodium chloride flush  10-40 mL Intracatheter Q12H  . thiamine injection  100 mg Intravenous Daily    have reviewed scheduled and prn medications.  Physical Exam: General: Critically ill looking male,in bed, intubated and sedated Heart:RRR, s1s2 nl Lungs: Clear anteriorly, no increased work of breathing Abdomen:soft, Non-tender, non-distended Extremities: LE edema mildly improved. Dialysis Access: L IJ temporary HD catheter in place  Sahar Ryback B Murry Khiev 10/01/2020,9:50 AM  LOS: 18 days

## 2020-10-02 DIAGNOSIS — Z7189 Other specified counseling: Secondary | ICD-10-CM | POA: Diagnosis not present

## 2020-10-02 DIAGNOSIS — R570 Cardiogenic shock: Secondary | ICD-10-CM | POA: Diagnosis not present

## 2020-10-02 DIAGNOSIS — Z515 Encounter for palliative care: Secondary | ICD-10-CM | POA: Diagnosis not present

## 2020-10-02 DIAGNOSIS — N189 Chronic kidney disease, unspecified: Secondary | ICD-10-CM | POA: Diagnosis not present

## 2020-10-02 DIAGNOSIS — R918 Other nonspecific abnormal finding of lung field: Secondary | ICD-10-CM | POA: Diagnosis not present

## 2020-10-02 DIAGNOSIS — N170 Acute kidney failure with tubular necrosis: Secondary | ICD-10-CM | POA: Diagnosis not present

## 2020-10-02 DIAGNOSIS — G9340 Encephalopathy, unspecified: Secondary | ICD-10-CM | POA: Diagnosis not present

## 2020-10-02 DIAGNOSIS — J9601 Acute respiratory failure with hypoxia: Secondary | ICD-10-CM | POA: Diagnosis not present

## 2020-10-02 DIAGNOSIS — N179 Acute kidney failure, unspecified: Secondary | ICD-10-CM | POA: Diagnosis not present

## 2020-10-02 LAB — COMPREHENSIVE METABOLIC PANEL
ALT: 684 U/L — ABNORMAL HIGH (ref 0–44)
AST: 314 U/L — ABNORMAL HIGH (ref 15–41)
Albumin: 2.2 g/dL — ABNORMAL LOW (ref 3.5–5.0)
Alkaline Phosphatase: 141 U/L — ABNORMAL HIGH (ref 38–126)
Anion gap: 12 (ref 5–15)
BUN: 53 mg/dL — ABNORMAL HIGH (ref 6–20)
CO2: 22 mmol/L (ref 22–32)
Calcium: 7.2 mg/dL — ABNORMAL LOW (ref 8.9–10.3)
Chloride: 103 mmol/L (ref 98–111)
Creatinine, Ser: 1.32 mg/dL — ABNORMAL HIGH (ref 0.61–1.24)
GFR, Estimated: >60 mL/min (ref >60)
Glucose, Bld: 92 mg/dL (ref 70–99)
Potassium: 3.5 mmol/L (ref 3.5–5.1)
Sodium: 137 mmol/L (ref 135–145)
Total Bilirubin: 1.5 mg/dL — ABNORMAL HIGH (ref 0.3–1.2)
Total Protein: 4.8 g/dL — ABNORMAL LOW (ref 6.5–8.1)

## 2020-10-02 LAB — TYPE AND SCREEN
ABO/RH(D): A POS
Antibody Screen: NEGATIVE
Unit division: 0

## 2020-10-02 LAB — COOXEMETRY PANEL
Carboxyhemoglobin: 1.5 % (ref 0.5–1.5)
Carboxyhemoglobin: 0.8 % (ref 0.5–1.5)
Methemoglobin: 1.3 % (ref 0.0–1.5)
Methemoglobin: 1.1 % (ref 0.0–1.5)
O2 Saturation: 82 %
O2 Saturation: 30 %
Total hemoglobin: 9.1 g/dL — ABNORMAL LOW (ref 12.0–16.0)
Total hemoglobin: 8.3 g/dL — ABNORMAL LOW (ref 12.0–16.0)

## 2020-10-02 LAB — BPAM RBC
Blood Product Expiration Date: 202205232359
ISSUE DATE / TIME: 202205030841
Unit Type and Rh: 6200

## 2020-10-02 LAB — SEROTONIN RELEASE ASSAY (SRA)
SRA .2 IU/mL UFH Ser-aCnc: 66 % — ABNORMAL HIGH (ref 0–20)
SRA 100IU/mL UFH Ser-aCnc: <1 % (ref 0–20)

## 2020-10-02 LAB — CBC
HCT: 28.5 % — ABNORMAL LOW (ref 39.0–52.0)
Hemoglobin: 9 g/dL — ABNORMAL LOW (ref 13.0–17.0)
MCH: 28.8 pg (ref 26.0–34.0)
MCHC: 31.6 g/dL (ref 30.0–36.0)
MCV: 91.1 fL (ref 80.0–100.0)
Platelets: 86 10*3/uL — ABNORMAL LOW (ref 150–400)
RBC: 3.13 MIL/uL — ABNORMAL LOW (ref 4.22–5.81)
RDW: 17.3 % — ABNORMAL HIGH (ref 11.5–15.5)
WBC: 35.7 10*3/uL — ABNORMAL HIGH (ref 4.0–10.5)
nRBC: 4.3 % — ABNORMAL HIGH (ref 0.0–0.2)

## 2020-10-02 LAB — GLUCOSE, CAPILLARY
Glucose-Capillary: 109 mg/dL — ABNORMAL HIGH (ref 70–99)
Glucose-Capillary: 140 mg/dL — ABNORMAL HIGH (ref 70–99)
Glucose-Capillary: 57 mg/dL — ABNORMAL LOW (ref 70–99)
Glucose-Capillary: 59 mg/dL — ABNORMAL LOW (ref 70–99)
Glucose-Capillary: 102 mg/dL — ABNORMAL HIGH (ref 70–99)

## 2020-10-02 LAB — APTT
aPTT: 65 seconds — ABNORMAL HIGH (ref 24–36)
aPTT: 74 seconds — ABNORMAL HIGH (ref 24–36)

## 2020-10-02 LAB — CK: Total CK: 1477 U/L — ABNORMAL HIGH (ref 49–397)

## 2020-10-02 LAB — PHOSPHORUS: Phosphorus: 4.1 mg/dL (ref 2.5–4.6)

## 2020-10-02 LAB — LACTATE DEHYDROGENASE: LDH: 534 U/L — ABNORMAL HIGH (ref 98–192)

## 2020-10-02 LAB — MISC LABCORP TEST (SEND OUT): Labcorp test code: 520085

## 2020-10-02 LAB — MAGNESIUM: Magnesium: 2.8 mg/dL — ABNORMAL HIGH (ref 1.7–2.4)

## 2020-10-02 MED ORDER — DEXTROSE 50 % IV SOLN
INTRAVENOUS | Status: AC
  Filled 2020-10-02: qty 50

## 2020-10-02 MED ORDER — FENTANYL BOLUS VIA INFUSION
50.0000 ug | INTRAVENOUS | Status: DC | PRN
  Filled 2020-10-02: qty 200

## 2020-10-02 MED ORDER — DEXTROSE 50 % IV SOLN
12.5000 g | INTRAVENOUS | Status: AC
  Administered 2020-10-02: 12.5 g via INTRAVENOUS

## 2020-10-02 MED ORDER — PRISMASOL BGK 4/2.5 32-4-2.5 MEQ/L REPLACEMENT SOLN
Status: DC
  Filled 2020-10-02: qty 5000

## 2020-10-02 MED ORDER — PRISMASOL BGK 4/2.5 32-4-2.5 MEQ/L EC SOLN
Status: DC
  Filled 2020-10-02 (×5): qty 5000

## 2020-10-02 MED ORDER — PRISMASOL BGK 4/2.5 32-4-2.5 MEQ/L REPLACEMENT SOLN
Status: DC
  Filled 2020-10-02 (×2): qty 5000

## 2020-10-02 MED ORDER — ANTICOAGULANT SODIUM CITRATE 4% (200MG/5ML) IV SOLN
5.0000 mL | Freq: Once | Status: AC
  Administered 2020-10-02: 2.8 mL
  Filled 2020-10-02: qty 5

## 2020-10-11 LAB — VITAMIN B1: Vitamin B1 (Thiamine): 445.1 nmol/L — ABNORMAL HIGH (ref 66.5–200.0)

## 2020-10-30 NOTE — Progress Notes (Signed)
[facility] NEPHROLOGY PROGRESS NOTE  Assessment/ Plan: 42M new acute sCHF, myopathy unclear etiology, dialysis dependent AKI on CRRT.    #Acute kidney injury on CKD stage III multifactorial etiology including ischemic ATN due to cardiogenic/septic shock, severe CHF with poor renal perfusion, rhabdomyolysis.  Deteriorated severely in past 48h. On inotrope and pressors to attempt to maintain blood pressure but still very hypotensiive.  CRRT start 4/25.  All 4K bath. .  Angiomax for [facility].  Cotn current stettings. Very poor prognosis.   #Acute metabolic encephalopathy, acute CVAs.   #Acute systolic CHF with cardiogenic shock, LV thrombus: Seen by cardiologist.  Currently on dobutamine, angiomax.  Not improving.  #Pulmonary infiltrates, pneumonia: Completed antibiotics.  #Diffuse muscle weakness with elevated CPK, nontraumatic rhabdomyolysis status post muscle biopsy pending final results.  Currently on IV Solu-Medrol and plasma exchange by hematologist.   #Multiorgan failure including congestive hepatopathy, renal failure, CHF, stroke.  Doubt he can survive.  GOC / Palliative following.   #VDRF  Subjective:   Ongoing hypotension, Still on NE and DBA req  GOC with family in process  K < 4, moved to 4K bath this AM  K 3.5, P 4.1   Objective Vital signs in last 24 hours: Vitals:   2020/10/21 1030 2020-10-21 1115 10/21/2020 1130 10-21-20 1152  BP: (!) 66/50  (!) 60/39 (!) 52/32  Pulse: (!) 101  90 96  Resp: (!) _0 Temp:  98.2 F (36.8 C)    TempSrc:  Axillary    SpO2: 100%  (!) 88% 100%  Weight:      Height:       Weight change: 2.1 kg  Intake/Output Summary (Last 24 hours) at 21-Oct-2020 1238 Last data filed at 10/21/20 1200 Gross per 24 hour  Intake 3434.23 ml  Output 675 ml  Net 2759.23 ml       Labs: Basic Metabolic Panel: Recent Labs  Lab 10/01/20 0442 10/01/20 1837 10-21-20 0423  NA 135 135 137  K 4.1 3.9 3.5  CL 100 102 103  CO2 _1 GLUCOSE 134* 102* 92  BUN 57* 49* 53*  CREATININE 1.38* 1.20 1.32*  CALCIUM 7.4* 7.3* 7.2*  PHOS 4.4 3.9 4.1   Liver Function Tests: Recent Labs  Lab 09/29/20 1031 09/29/20 1539 10/01/20 1307 10/01/20 1837 10/21/20 0423  AST 105*  --  372*  --  314*  ALT 175*  --  731*  --  684*  ALKPHOS 70  --  120  --  141*  BILITOT 1.0  --  1.7*  --  1.5*  PROT 5.7*  --  4.9*  --  4.8*  ALBUMIN 3.1*   < > 2.5* 2.4* 2.2*   < > = values in this interval not displayed.   No results for input(s): LIPASE, AMYLASE in the last 168 hours. No results for input(s): AMMONIA in the last 168 hours. CBC: Recent Labs  Lab 09/27/20 0301 09/28/20 0301 09/29/20 0313 09/30/20 0309 09/30/20 1606 10/01/20 0441 10/01/20 1838 2020/10/21 0423  WBC 26.1*   < > 26.2* 25.1*  --  23.9* 36.6* 35.7*  NEUTROABS 23.7*  --   --   --   --   --  34.6*  --   HGB 7.4*   < > 7.1* 7.3*   < > 6.9* 9.4* 9.0*  HCT 24.0*   < > 23.1* 23.9*   < > 22.5* 28.9* 28.5*  MCV 96.8   < > 97.5  96.0  --  95.7 89.5 91.1  PLT 35*   < > 82* 91*  --  94* 89* 86*   < > = values in this interval not displayed.   Cardiac Enzymes: Recent Labs  Lab 09/28/20 0301 09/29/20 1031 09/30/20 0731 10/01/20 1307 Oct 28, 2020 0423  CKTOTAL 7,402* 5,977* 4,227* 1,979* 1,477*   CBG: Recent Labs  Lab 10-28-20 0333 2020/10/28 0741 10/28/20 0803 28-Oct-2020 0828 10/28/20 1114  GLUCAP 109* 57* 59* 140* 102*    Iron Studies: No results for input(s): IRON, TIBC, TRANSFERRIN, FERRITIN in the last 72 hours. Studies/Results: CT HEAD WO CONTRAST  Result Date: 09/30/2020 CLINICAL DATA:  Acute encephalopathy EXAM: CT HEAD WITHOUT CONTRAST TECHNIQUE: Contiguous axial images were obtained from the base of the skull through the vertex without intravenous contrast. COMPARISON:  09/19/2020 FINDINGS: Brain: There is an acute or early subacute infarct of the left occipital lobe within the PCA territory. Areas of hyperdensity may indicate some petechial  hemorrhage. There is no midline shift or hydrocephalus. There is periventricular hypoattenuation compatible with chronic microvascular disease. Vascular: No abnormal hyperdensity of the major intracranial arteries or dural venous sinuses. No intracranial atherosclerosis. Skull: The visualized skull base, calvarium and extracranial soft tissues are normal. Sinuses/Orbits: Right maxillary retention cysts. The orbits are normal. IMPRESSION: Acute or early subacute infarct of the left occipital lobe within the PCA territory. Areas of hyperdensity may indicate some petechial hemorrhage. Electronically Signed   By: Ulyses Jarred M.D.   On: 09/30/2020 21:53   DG CHEST PORT 1 VIEW  Result Date: 09/30/2020 CLINICAL DATA:  Acute respiratory failure with hypoxemia EXAM: PORTABLE CHEST 1 VIEW COMPARISON:  09/23/2020 chest radiograph. FINDINGS: Endotracheal tube tip is 4.8 cm above the carina. Enteric tube enters stomach with the tip in the body of the stomach. Left internal jugular central venous catheter terminates in the middle third of the SVC. Right internal jugular central venous catheter terminates at the cavoatrial junction. Stable cardiomediastinal silhouette with mild cardiomegaly. No pneumothorax. No pleural effusion. Lungs appear clear, with no acute consolidative airspace disease and no pulmonary edema. IMPRESSION: 1. Well-positioned support structures. No pneumothorax. 2. Stable mild cardiomegaly without pulmonary edema. No active pulmonary disease. Electronically Signed   By: Ilona Sorrel M.D.   On: 09/30/2020 17:58   EEG adult  Result Date: 10-28-20 Lora Havens, MD     10/28/2020  8:38 AM Patient Name: Dennis Howard. MRN: [id number] Epilepsy Attending: Lora Havens Referring Physician/Provider: Dr Noemi Chapel Date: 10/01/2020 Duration: 23.36 mins Patient history: 55 year old male with altered mental status, seizure-like episodes.  EEG to evaluate for seizures. Level of alertness: Awake AEDs during  EEG study: Gabapentin Technical aspects: This EEG study was done with scalp electrodes positioned according to the 10-20 International system of electrode placement. Electrical activity was acquired at a sampling rate of _0  and reviewed with a high frequency filter of _1  and a low frequency filter of _2 . EEG data were recorded continuously and digitally stored. Description: No clear posterior dominant rhythm was seen. EEG showed continuous generalized 3 to 6 Hz theta-delta slowing. Single sharp transient was noted in left centro- temporal region.  Hyperventilation and photic stimulation were not performed.   ABNORMALITY - Continuous slow, generalized IMPRESSION: This study is suggestive of moderate diffuse encephalopathy, nonspecific etiology. No seizures or epileptiform discharges were seen throughout the recording. Priyanka Barbra Sarks    Medications: Infusions: . sodium chloride Stopped (10/01/20 1000)  . anticoagulant sodium citrate    .  bivalirudin (ANGIOMAX) infusion 0.5 mg/mL (Non-ACS indications) 0.14 mg/kg/hr (10-06-20 1200)  . DOBUTamine 10 mcg/kg/min (Oct 06, 2020 1200)  . feeding supplement (PIVOT 1.5 CAL) 1,000 mL (10-06-20 1048)  . fentaNYL infusion INTRAVENOUS 25 mcg/hr (2020-10-06 1200)  . methylPREDNISolone (SOLU-MEDROL) injection Stopped (06-Oct-2020 0032)  . norepinephrine (LEVOPHED) Adult infusion 40 mcg/min (10-06-20 1200)    Scheduled Medications: . capsaicin   Topical BID  . chlorhexidine gluconate (MEDLINE KIT)  15 mL Mouth Rinse BID  . Chlorhexidine Gluconate Cloth  6 each Topical Daily  . gabapentin  100 mg Oral Daily  . insulin aspart  0-20 Units Subcutaneous Q4H  . insulin detemir  10 Units Subcutaneous BID  . lidocaine   Topical BID  . magic mouthwash  10 mL Oral TID  . mouth rinse  15 mL Mouth Rinse 10 times per day  . multivitamin with minerals  1 tablet Per Tube Daily  . pantoprazole  40 mg Intravenous Q12H  . polyethylene glycol  17 g Per Tube Daily  . sodium  chloride flush  10-40 mL Intracatheter Q12H  . thiamine injection  100 mg Intravenous Daily    have reviewed scheduled and prn medications.  Physical Exam: General: Critically ill looking male,in bed, intubated and sedated Heart:RRR, s1s2 nl Lungs: Clear anteriorly, no increased work of breathing Abdomen:soft, Non-tender, non-distended Extremities: LE edema mildly improved. Dialysis Access: L IJ temporary HD catheter in place  Caitlain Tweed B Lekeshia Kram 10-06-2020,12:38 PM  LOS: 19 days

## 2020-10-30 NOTE — Procedures (Signed)
Patient Name: Dennis Howard.  MRN: [id number]  Epilepsy Attending: Charlsie Quest  Referring Physician/Provider: Dr Karie Fetch Date: 10/01/2020 Duration: 23.36 mins  Patient history: 55 year old male with altered mental status, seizure-like episodes.  EEG to evaluate for seizures.  Level of alertness: Awake  AEDs during EEG study: Gabapentin  Technical aspects: This EEG study was done with scalp electrodes positioned according to the 10-20 International system of electrode placement. Electrical activity was acquired at a sampling rate of 500Hz  and reviewed with a high frequency filter of 70Hz  and a low frequency filter of 1Hz . EEG data were recorded continuously and digitally stored.   Description: No clear posterior dominant rhythm was seen. EEG showed continuous generalized 3 to 6 Hz theta-delta slowing. Single sharp transient was noted in left centro- temporal region.  Hyperventilation and photic stimulation were not performed.     ABNORMALITY - Continuous slow, generalized  IMPRESSION: This study is suggestive of moderate diffuse encephalopathy, nonspecific etiology. No seizures or epileptiform discharges were seen throughout the recording.  [name] 

## 2020-10-30 NOTE — Progress Notes (Addendum)
NAME:  Dennis Antilla., MRN:  [id number], DOB:  1965-08-16, LOS: 19 ADMISSION DATE:  09/05/2020, CONSULTATION DATE: 09/16/2020 REFERRING MD: Wilkie Aye - EM CHIEF COMPLAINT:  Altered Mental Status  History of Present Illness:  55 yo male former smoker presented with altered mental status that progressed for one week prior to admission.  This was associated with lower leg swelling with blisters, weight gain, dyspnea, and cough.  Also had progressive muscle weakness.  Had recent outpt treatment for pneumonia with doxycycline.  Pertinent  Medical History  CKD III - sr cr 2013 1.6, HTN, Bronchitis, THC Use    Significant Hospital Events: Including procedures, antibiotic start and stop dates in addition to other pertinent events   . 4/15 Admitted to ICU. Treated with azithro, ceftriaxone, flagyl, unasyn. Tylenol, acetaminophen, ETOH, influenza, COVID, hepatitis, urine strep antigen, legionella, & UDS negative.  . 4/16 ECHO with global hypokinesis, anteroseptal wall and apex are akinetic, anterior wall severely hypokinetic, very large layered semi-mobile apical thrombus extending from the apex along the anteroseptal wall, LVEF ~15-20%, RV systolic function mild to moderately reduced.  tarted on dobuatmine drip. ANA negative. Blood cultures negative.  . 4/17 Unasyn stopped, cefepime initiated  . 4/18 Dobutamine at , net neg 1.7L, tolerating soft diet, no n/v . 4/19 Dobutamine increased to 7.89mcg . 4/20 MRI Brain >> 70mm acute infarction at the inferior cerebellum on left and 37mm acute infarction at the left parietooccipital junction. Subacute ischemic changes of the cortical subcortical brain. . 4/22 Thigh hematoma post biopsy . 4/23 Dobutamine at 6.54mcg, pt on heparin gtt, PPI gtt. Muscle biopsy pending.  . 4/24 dark colored bowel movement, hemoglobin stable . 4/25 CK 32K, CRRT started . 4/26 PLEX started yesterday. . 4/30:Stronger, tolerating orally . 5/1: PLEX completed 4/30. Decrease norepi due  to purple dusky toes. . 5/2 drop in coox on lower NE> rate increased again>> Intubated for MS change . 5/3: Co ox 35, New acute vs subacute infarct of the L occipital lobe, within PCA territory per CT Head 5/2, remains on CVVHD, keeping even as on 35 of Levo at present. Dobutamine of 6 mcg.kg./ min, Fentanyl 50 mcg/hr , Bivalirudin at 0.15 mg/kg/hr . Vasopressors are being kept at current levels  Interim History / Subjective:  Remains intubated sedated but does track and follow ongoing discussions about end-of-life and comfort care continue  Objective   Blood pressure (!) 89/53, pulse 92, temperature (!) 94.1 F (34.5 C), temperature source Axillary, resp. rate 11, height 6' (1.829 m), weight 74.2 kg, SpO2 100 %. CVP:  [3 mmHg-13 mmHg] 13 mmHg  Vent Mode: PRVC FiO2 (%):  [30 %] 30 % Set Rate:  [12 bmp] 12 bmp Vt Set:  [620 mL] 620 mL PEEP:  [5 cmH20] 5 cmH20 Plateau Pressure:  [8 cmH20-17 cmH20] 15 cmH20   Intake/Output Summary (Last 24 hours) at 10/09/2020 1062 Last data filed at 10/17/2020 0800 Gross per 24 hour  Intake 3604.26 ml  Output 660 ml  Net 2944.26 ml   Filed Weights   09/30/20 0500 10/01/20 0418 10/04/2020 0500  Weight: 69.1 kg 72.1 kg 74.2 kg    Examination:  General: Ill-appearing male on full mechanical ventilatory support HEENT: Endotracheal tube gastric tube in place Neuro: Tracks at times follows simple commands at times with upper extremities CV: Heart sounds are regular PULM: Rhonchi bilaterally Vent pressure regulated volume control FIO2 30% PEEP 5 RATE 12+3 VT 620  GI: soft, faint GU: Urine Extremities: Lower extremity wraps are in  place Skin: no rashes or lesions    Resolved Hospital Problem list   Encephalopathy  Assessment & Plan:   Diffuse muscle weakness with elevated CPK and aldolase- concern for myopathy or myositis.  Work-up to date has been negative  Awaitmuscle biopsy results Completed Plex treatment Trending towards comfort care  will hold any further Plex treatments at this time   Acute HFrEF with cardiogenic shock Left ventricular ejection fraction of 15% and global hypokinesis, LV thrombus  Currently on inotropes per cardiology remains in refractory cardiogenic shock. 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 Recent Labs    10/01/20 1838 10/16/20 0423  HGB 9.4* 9.0*   Transfuse per protocol Continue anticoagulation bivalirudin Continue to monitor    Hyperglycemia, controlled>> now normalizing with decrease in steroids CBG (last 3)  Recent Labs    16-Oct-2020 0741 2020-10-16 0803 Oct 16, 2020 0828  GLUCAP 57* 59* 140*   Continue to monitor Sliding scale insulin protocol   Acute embolic CVA New acute vs subacute infarct of the L occipital lobe, within PCA territory 5/2  Questionable MRI under future Ongoing palliative care discussions     Acute kidney injury on chronic kidney disease Rhabdomyolysis Currently on CRRT Monitor labs     Elevated transaminase levels, potentially due to muscle breakdown.  Normal bilirubin.  Albumin minimally decreased, likely multifactorial.  Been previously minimally elevated Congestive hepatopathy Continue to monitor   Pain IV fentanyl  GoC -Palliative Care consult 5/2, and Goals of Care meeting 5/3 - concerned that this may not have a correctable cause as pt has failed to significantly improve on steroids and PLEX. Now with New acute vs sub acute infarct, inability to wean down  Inotropes, worsening Co-ox , increased pressor requirements and isn't a candidate for mechanical support. Poor prognosis.Comfort Care will be discussed. 10/16/2020 plan follow discussions with family currently we will Vasopressors at current level.  Best practice (right click and "Reselect all SmartList Selections" daily)  Diet:  Tube Feed , Mechanical soft diet  pain/Anxiety/Delirium protocol (if  indicated): No VAP protocol (if indicated): Not indicated DVT prophylaxis: Systemic AC GI prophylaxis: PPI BID Glucose control:  SSI Yes and Basal insulin Yes Central venous access:  Yes, and it is still needed Arterial line:  N/A Foley:  Yes, and it is still needed,  Mobility:  bed rest  PT consulted: N/A Last date of multidisciplinary goals of care discussion - ongoing, father updated Oct 16, 2020. Plans to come in at 1300. Code Status:  Partial ,capping pressor support 10/16/2020 Disposition: ICU  Labs:   CMP Latest Ref Rng & Units 2020-10-16 10/01/2020 10/01/2020  Glucose 70 - 99 mg/dL 92 106(Y) -  BUN 6 - 20 mg/dL 69(S) 85(I) -  Creatinine 0.61 - 1.24 mg/dL 6.27(O) 3.50 -  Sodium 135 - 145 mmol/L 137 135 -  Potassium 3.5 - 5.1 mmol/L 3.5 3.9 -  Chloride 98 - 111 mmol/L 103 102 -  CO2 22 - 32 mmol/L 22 25 -  Calcium 8.9 - 10.3 mg/dL 7.2(L) 7.3(L) -  Total Protein 6.5 - 8.1 g/dL 4.8(L) - 4.9(L)  Total Bilirubin 0.3 - 1.2 mg/dL 0.9(F) - 1.7(H)  Alkaline Phos 38 - 126 U/L 141(H) - 120  AST 15 - 41 U/L 314(H) - 372(H)  ALT 0 - 44 U/L 684(H) - 731(H)    CBC Latest Ref Rng & Units 2020-10-16 10/01/2020 10/01/2020  WBC 4.0 - 10.5 K/uL 35.7(H) 36.6(H) 23.9(H)  Hemoglobin 13.0 - 17.0 g/dL 9.0(L) 9.4(L) 6.9(LL)  Hematocrit 39.0 - 52.0 % 28.5(L) 28.9(L) 22.5(L)  Platelets 150 - 400 K/uL 86(L) 89(L) 94(L)    ABG    Component Value Date/Time   PHART 7.537 (H) 09/30/2020 1819   PCO2ART 32.3 09/30/2020 1819   PO2ART 584 (H) 09/30/2020 1819   HCO3 27.5 09/30/2020 1819   TCO2 28 09/30/2020 1819   ACIDBASEDEF 1.8 09/14/2020 0031   O2SAT 82.0 10/21/2020 0404    CBG (last 3)  Recent Labs    10/11/2020 0741 10/29/2020 0803 10/11/2020 0828  GLUCAP 57* 59* 140*   App cct 45 min   Brett Canales Keilen Kahl ACNP Acute Care Nurse Practitioner [facility] Pul[facility]/Critical [facility] Please consult Amion 10/22/2020, 9:03 AM

## 2020-10-30 NOTE — Progress Notes (Signed)
This chaplain was updated by PMT the consult for spiritual care is no longer needed.  The Pt. and family's clergy will visit.

## 2020-10-30 NOTE — Progress Notes (Addendum)
ANTICOAGULATION CONSULT NOTE  Pharmacy Consult for Bivalirudin Indication: LV mural thrombus  Heparin Dosing Weight:  77.4 kg  Labs: Recent Labs    09/30/20 0731 09/30/20 1237 10/01/20 0441 10/01/20 0442 10/01/20 1230 10/01/20 1307 10/01/20 1837 10/01/20 1838 10/17/2020 0423  HGB  --    < > 6.9*  --   --   --   --  9.4* 9.0*  HCT  --    < > 22.5*  --   --   --   --  28.9* 28.5*  PLT  --   --  94*  --   --   --   --  89* 86*  APTT  --    < > 50*  --  52*  --   --  51* 65*  CREATININE  --    < >  --  1.38*  --   --  1.20  --  1.32*  CKTOTAL 4,227*  --   --   --   --  1,979*  --   --  1,477*   < > = values in this interval not displayed.   Assessment: 55 yr old man presented with AMS. Pharmacy was originally consulted for heparin for NSTEMI, then continued for discovered LV thrombus. Pt was on no anticoagulants PTA.  -MRI brain on 4/20 with multiple acute infarcts and subacute ischemic changes (target lower end of goal range) -Patient was on IV heparin 4/16 until 4/26 (held intermittently for concerns of bleeding), stopped on 4/26 due to platelets dropping to 21. 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 SRA ordered 4/27, results still in process. Patient is off all heparin and was initiated on bivalirudin.  Bivalirudin preferred over argatroban due to liver dysfunction.  -Patient also on CRRT, s/p PLEX.  APTT increased from 51 to 65 overnight (higher end of goal). No issues with CRRT running per RN and no interruptions.  Hgb 9, platelets stable at 86. Will decrease rate slightly to ensure patient does not become supratherapeutic.   Goal of Therapy:  Goal aPTT 50-85 (no boluses) -- target lower end of goal ~50-70 given acute infarcts Monitor platelets by anticoagulation protocol: Yes   Plan:  -Decrease bivalirudin slightly to 0.14 mg/kg/hr -Check aPTT in 4 hours -Monitor daily CBC, s/sx bleeding   PM UPDATE: -aPTT SUPRAtherapeutic at 74.  No signs of  bleeding. -Reduce bivalirudin by ~20% to 0.11mg /kg/hr -f/u 1700 aPTT  Rexford Maus, PharmD PGY-1 Acute Care Pharmacy Resident 10/25/2020 6:48 AM

## 2020-10-30 NOTE — Progress Notes (Signed)
IP PROGRESS NOTE  Subjective: He remains intubated and on the ventilator  Objective: Vital signs in last 24 hours: Blood pressure (!) 52/32, pulse 96, temperature 98.2 F (36.8 C), temperature source Axillary, resp. rate 15, height 6' (1.829 m), weight 163 lb 9.3 oz (74.2 kg), SpO2 100 %.  Intake/Output from previous day: 05/03 0701 - 05/04 0700 In: 3905.7 [I.V.:1789; Blood:482.5; NG/GT:1595; IV Piggyback:39.1] Out: 677   Physical Exam:  HEENT: ETT in place. Cortrack in place. Abdomen: Mildly distended Extremities: Ace wrapping at the lower leg bilaterally Neurologic: Opens eyes, not following commands Skin: No ecchymoses  Right and left IJ catheter sites without evidence of infection or bleeding  Lab Results: Recent Labs    10/01/20 1838 10-17-20 0423  WBC 36.6* 35.7*  HGB 9.4* 9.0*  HCT 28.9* 28.5*  PLT 89* 86*  ADAMTS13 on 09/17/2020 - 38.2% Blood smear 09/24/2020: The platelets are decreased in number, no platelet clumps.  There is a leukocytosis.  The majority the white cells are mature neutrophils.  No blast or other young forms are seen.  Increased nucleated red cells.  The polychromasia is increased.  There are burr cells, target cells, acanthocytes, few ovalocytes, and a few schistocytes. BMET Recent Labs    10/01/20 1837 10-17-20 0423  NA 135 137  K 3.9 3.5  CL 102 103  CO2 25 22  GLUCOSE 102* 92  BUN 49* 53*  CREATININE 1.20 1.32*  CALCIUM 7.3* 7.2*   Bilirubin 1.6, LDH 594 No results found for: CEA1  Studies/Results: CT HEAD WO CONTRAST  Result Date: 09/30/2020 CLINICAL DATA:  Acute encephalopathy EXAM: CT HEAD WITHOUT CONTRAST TECHNIQUE: Contiguous axial images were obtained from the base of the skull through the vertex without intravenous contrast. COMPARISON:  09/19/2020 FINDINGS: Brain: There is an acute or early subacute infarct of the left occipital lobe within the PCA territory. Areas of hyperdensity may indicate some petechial hemorrhage. There  is no midline shift or hydrocephalus. There is periventricular hypoattenuation compatible with chronic microvascular disease. Vascular: No abnormal hyperdensity of the major intracranial arteries or dural venous sinuses. No intracranial atherosclerosis. Skull: The visualized skull base, calvarium and extracranial soft tissues are normal. Sinuses/Orbits: Right maxillary retention cysts. The orbits are normal. IMPRESSION: Acute or early subacute infarct of the left occipital lobe within the PCA territory. Areas of hyperdensity may indicate some petechial hemorrhage. Electronically Signed   By: Dennis Howard M.D.   On: 09/30/2020 21:53   DG CHEST PORT 1 VIEW  Result Date: 09/30/2020 CLINICAL DATA:  Acute respiratory failure with hypoxemia EXAM: PORTABLE CHEST 1 VIEW COMPARISON:  09/23/2020 chest radiograph. FINDINGS: Endotracheal tube tip is 4.8 cm above the carina. Enteric tube enters stomach with the tip in the body of the stomach. Left internal jugular central venous catheter terminates in the middle third of the SVC. Right internal jugular central venous catheter terminates at the cavoatrial junction. Stable cardiomediastinal silhouette with mild cardiomegaly. No pneumothorax. No pleural effusion. Lungs appear clear, with no acute consolidative airspace disease and no pulmonary edema. IMPRESSION: 1. Well-positioned support structures. No pneumothorax. 2. Stable mild cardiomegaly without pulmonary edema. No active pulmonary disease. Electronically Signed   By: Dennis Howard M.D.   On: 09/30/2020 17:58   EEG adult  Result Date: 17-Oct-2020 Dennis Havens, MD     17-Oct-2020  8:38 AM Patient Name: Dennis Howard. MRN: [id number] Epilepsy Attending: Lora Howard Referring Physician/Provider: Dr Dennis Howard Date: 10/01/2020 Duration: 23.36 mins Patient history: 55 year old male with  altered mental status, seizure-like episodes.  EEG to evaluate for seizures. Level of alertness: Awake AEDs during EEG study:  Gabapentin Technical aspects: This EEG study was done with scalp electrodes positioned according to the 10-20 International system of electrode placement. Electrical activity was acquired at a sampling rate of 500Hz and reviewed with a high frequency filter of 70Hz and a low frequency filter of 1Hz. EEG data were recorded continuously and digitally stored. Description: No clear posterior dominant rhythm was seen. EEG showed continuous generalized 3 to 6 Hz theta-delta slowing. Single sharp transient was noted in left centro- temporal region.  Hyperventilation and photic stimulation were not performed.   ABNORMALITY - Continuous slow, generalized IMPRESSION: This study is suggestive of moderate diffuse encephalopathy, nonspecific etiology. No seizures or epileptiform discharges were seen throughout the recording. Dennis Howard    Medications: I have reviewed the patient's current medications.  Assessment/Plan: 1.  Pulmonary infiltrates 2.  Chronic/acute renal failure 3.  Cardiogenic shock on hospital admission, biventricular heart failure 4.  Liver failure secondary to cardiogenic shock, underlying chronic liver disease? 5.  Anemia 6.  Thrombocytopenia  Daily plasmapheresis 09/24/2020 for 5 treatments  Solu-Medrol 09/24/2020 7.   Coagulopathy 8.   Altered mental status-hepatic encephalopathy and CVAs 9.   Rhabdomyolysis 10.  Oral candidiasis treated with Diflucan 11.  Muscle weakness-status post a muscle biopsy 09/15/2020 12.  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  He has persistent hypotension.  His prognosis appears very poor.  He is stable from a hematologic standpoint.  No further hematologic recommendations at present.  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  I would consider a diagnostic bone marrow biopsy if his condition stabilized, but he does not appear to be a candidate for a bone marrow biopsy or treatment.    Recommendations:  1.  Transfuse packed red blood cells as needed 2.   Taper steroids 2    continue goals of care discussion with family, management of respiratory and multiorgan failure per critical care and nephrology    LOS: 19 days

## 2020-10-30 NOTE — Progress Notes (Signed)
Palliative:  HPI: 55 y.o.malewith past medical history of chronic kidney disease stage 3, hypertension, bronchitisadmitted on 4/15/2022with altered mental status with work up of possible myopathy/myositis and awaiting muscle biopsy results.Hospitalization further complicated by EF 01% with cardiogenic shock requiring dobutamine and norepi, CRRT, HIT, acute embolic stroke. Palliative care requested to discuss goals of care due to poor progression with ongoing severe debility and multiorgan failure.  I met today at [place] bedside with mother, Lelan Pons. PCCM has spoken with them and Lelan Pons echoes their desire to focus on comfort but maintain vent support. We discussed expectation that Ziair will die even on vent and Lelan Pons is very at peace. She shares that she made peace with this a while ago if this is in [employer], forensic for [place]. Lelan Pons shares that there have been some disagreement for visitation for [place] but they have come to an agreement. We discussed Manning's current status with hypotension and agonal breathing on vent. I explained to Lelan Pons that the way Autumn is breathing currently is how someone breathes right before they take their last breath when not on vent.   I spent time speaking and listening to [place] share. All questions/concerns addressed. Therapeutic listening provided. Emotional support provided.  Exam: Sedated on vent. Appears comfortable. Agonal breathing.   Plan: - Comfort focused care. Family want to allow him to die on vent and do not want to make decision to remove vent support.   [place], NP Palliative Medicine Team Pager [phone] (Please see [email or web address] for schedule) Team Phone [phone]    Greater than 50%  of this time was spent counseling and coordinating care related to the above assessment and plan

## 2020-10-30 NOTE — Progress Notes (Signed)
SLP Cancellation Note  Patient Details Name: Dennis Howard. MRN: [id number] DOB: 12/01/1965   Cancelled treatment:       Reason Eval/Treat Not Completed: Other (comment) (Pt has demonstrated further decline and has been intubated. Per CCM's most recent note, pt "is in the dying process"; comfort options have been offered, but pt's family prefer for him to remain on the ventilator. SLP will sign off at this time, but please re-consult if clinically indicated.)  Infiniti Hoefling I. Vear Clock, MS, CCC-SLP Acute Rehabilitation Services Office number [phone] Pager [phone]  Scheryl Marten 10/18/2020, 4:15 PM

## 2020-10-30 NOTE — Progress Notes (Signed)
PCCM INTERVAL PROGRESS NOTE   Spoke with the patient's father and mother at bedside regarding the unfortunate course their son's health has taken. They understand he is in the dying process. I have offered options for comfort. There prefer for him to remain on the ventilator. They have given [place] permission to titrate fentanyl infusion for goal of comfort and no respiratory distress. DNR. Maintain all current treatments and therapies. No escalation of pressors. Do not re-start CRRT.    Joneen Roach, AGACNP-BC [facility]  See Amion for personal pager PCCM on call pager [phone] until 7pm. Please call Elink 7p-7a. [phone]  10/18/20 1:21 PM

## 2020-10-30 NOTE — Discharge Summary (Addendum)
Physician Discharge Summary  Patient ID: Dennis Howard. MRN: [id number] DOB/AGE: [birth year] 55 y.o.  Admit date: 2020/09/26 Discharge date: 10-15-20  Admission Diagnoses: Acute HFrEF Acute respiratory failure with hypoxia Cardiogenic shock LV thrombus Acute myositis; unknown etiology AKI on CKD 3 Lactic acidosis Hepatic encephalopathy Community acquired pneumonia Elevated transaminases congestive hepatopathy  Discharge Diagnoses:  Active Problems:   Encephalopathy acute   Protein-calorie malnutrition, severe   Cardiogenic shock (HCC)   Acute renal failure superimposed on stage 3b chronic kidney disease (HCC)   Thrombocytopenia (HCC)   Anemia due to acute blood loss   Leg edema   Acute respiratory failure with hypoxemia (HCC)   Acute HFrEF Cardiogenic shock LV thrombus Acute myositis; unknown etiology Acute anemia Acute thrombocytopenia 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 Treated with azithro, ceftriaxone, flagyl, unasyn. Tylenol, acetaminophen, ETOH, influenza, COVID, hepatitis, urine strep antigen, legionella, & UDS negative.   4/16 ECHO with global hypokinesis, anteroseptal wall and apex are akinetic, anterior wall severely hypokinetic, very large layered semi-mobile apical thrombus extending from the apex along the anteroseptal wall, LVEF ~15-20%, RV systolic function mild to moderately reduced.  tarted on dobuatmine drip. ANA negative. Blood cultures negative.   4/17 Unasyn stopped, cefepime initiated   4/18 Dobutamine at , net neg 1.7L, tolerating soft diet, no n/v  4/19 Dobutamine increased to 7.41mcg  4/20 MRI Brain >> 79mm acute infarction at the inferior cerebellum on left and 86mm acute infarction at the left parietooccipital junction. Subacute ischemic changes of the cortical  subcortical brain.  4/22 Thigh hematoma post biopsy  4/23 Dobutamine at 6.47mcg, pt on heparin gtt, PPI gtt. Muscle biopsy pending.   4/24 dark colored bowel movement, hemoglobin stable  4/25 CK 32K, CRRT started  4/26 PLEX started yesterday.  4/30:Stronger, tolerating orally  5/1: PLEX completed 4/30. Decrease norepi due to purple dusky toes.  5/2 drop in coox on lower NE> rate increased again>> Intubated for MS change  5/3: Co ox 35, New acute vs subacute infarct of the L occipital lobe, within PCA territory per CT Head 5/2, remains on CVVHD, keeping even as on 35 of Levo at present. Dobutamine of 6 mcg.kg./ min, Fentanyl 50 mcg/hr , Bivalirudin at 0.15 mg/kg/hr  Vasopressors are being kept at current levels  All cultures during hospitalization were negative. No source of infection identified. Sepsis ruled out as a cause of his MOF. He developed refractory shock with increasing pressor reqiurements on 5/3-5/4. He expired with his parents at bedside at 14:27 on 2020-10-15.   Consults:  Cardiology- advanced heart failure Nephrology Neurology Palliative Care Vascular surgery Oncology General surgery  Significant Diagnostic Studies:   Surgical path pending - muscle biopsy   CT head 09/30/20: IMPRESSION: Acute or early subacute infarct of the left occipital lobe within the PCA territory. Areas of hyperdensity may indicate some petechial hemorrhage.  MRI brain 09/21/2020:  IMPRESSION: 1. 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 2. 6 mm acute infarction at the left parietooccipital junction. 3. Subacute ischemic changes of the cortical and subcortical brain at the left parietooccipital junction. 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 4. Flow does appear to be present in the vertebrobasilar system. Consider MR angiography the head and neck or CT angiography of the head and neck. 5. The brain appears  otherwise normal without evidence other previous insult.  Echocardiogram 09/14/20: IMPRESSIONS  1. Global hypokinesis. The anteroseptal wall and apex are akinetic. The  anterior wall is severely hypokinetic.   There is a very large layered semi-mobil apical thrombus extending  from the apex along the anteroseptal wall, length extends from the apex to  mid ventricle. . Left ventricular ejection fraction, by estimation, is  15-20%. The left ventricle has  severely decreased function. The left ventricle demonstrates global  hypokinesis. The left ventricular internal cavity size was mildly dilated.  Left ventricular diastolic parameters are indeterminate.  2. Right ventricular systolic function mild to moderately reduced. The  right ventricular size is normal.  3. The mitral valve is abnormal. Mild mitral valve regurgitation. No  evidence of mitral stenosis.  4. The aortic valve is tricuspid. There is mild calcification of the  aortic valve. There is mild thickening of the aortic valve. Aortic valve  regurgitation is not visualized. No aortic stenosis is present.   09/26/20 [place] UE: Summary:  No evidence of deep vein or superficial vein thrombosis involving the  right and  left upper extremities in the veins that were visualized. See tech's note.    09/26/20 [place] LE:  RIGHT:  - There is no evidence of deep vein thrombosis in the lower extremity.  However, portions of this examination were limited- see technologist  comments above.  LEFT:  - There is no evidence of deep vein thrombosis in the lower extremity.  However, portions of this examination were limited- see technologist  comments above.     CMP Latest Ref Rng & Units 10/26/2020 10/01/2020 10/01/2020  Glucose 70 - 99 mg/dL 92 620(B) -  BUN 6 - 20 mg/dL 55(H) 74(B) -  Creatinine 0.61 - 1.24 mg/dL 6.38(G) 5.36 -  Sodium 135 - 145 mmol/L 137 135 -  Potassium 3.5 - 5.1 mmol/L 3.5 3.9 -  Chloride 98 - 111 mmol/L 103 102 -  CO2  22 - 32 mmol/L 22 25 -  Calcium 8.9 - 10.3 mg/dL 7.2(L) 7.3(L) -  Total Protein 6.5 - 8.1 g/dL 4.8(L) - 4.9(L)  Total Bilirubin 0.3 - 1.2 mg/dL 4.6(O) - 1.7(H)  Alkaline Phos 38 - 126 U/L 141(H) - 120  AST 15 - 41 U/L 314(H) - 372(H)  ALT 0 - 44 U/L 684(H) - 731(H)    CBC Latest Ref Rng & Units 10/22/2020 10/01/2020 10/01/2020  WBC 4.0 - 10.5 K/uL 35.7(H) 36.6(H) 23.9(H)  Hemoglobin 13.0 - 17.0 g/dL 9.0(L) 9.4(L) 6.9(LL)  Hematocrit 39.0 - 52.0 % 28.5(L) 28.9(L) 22.5(L)  Platelets 150 - 400 K/uL 86(L) 89(L) 94(L)      Treatments:  Mechanical ventilation Supplemental oxygen inotropes and vasopressor support anticoagulation Renal replacement therapy Analgesia insulin Plasma exchange Steroids Antibiotics Blood transfusions  Disposition: funeral home of the family's choosing    Signed: Steffanie Dunn 10/07/2020, 5:10 PM

## 2020-10-30 NOTE — Progress Notes (Signed)
Inpatient Diabetes Program Recommendations  AACE/ADA: New Consensus Statement on Inpatient Glycemic Control (2015)  Target Ranges:  Prepandial:   less than 140 mg/dL      Peak postprandial:   less than 180 mg/dL (1-2 hours)      Critically ill patients:  140 - 180 mg/dL   Lab Results  Component Value Date   GLUCAP 102 (H) 10/07/2020   HGBA1C 5.7 (H) 09/19/2020    Review of Glycemic Control  Results for DAEGAN, ARIZMENDI (MRN [id number]) as of 10/10/2020 12:23  Ref. Range 10/11/2020 03:33 10/13/2020 07:41 10/15/2020 08:03 10/03/2020 08:28 10/17/2020 11:14  Glucose-Capillary Latest Ref Range: 70 - 99 mg/dL 314 (H) 57 (L) 59 (L) 276 (H) 102 (H)    Current orders for Inpatient glycemic control:  Levemir 10 units BID Novolog 0-20 units Q4H Solumedrol 250 mg Q12H  Inpatient Diabetes Program Recommendations:     Levemir 12 units QHS  Will continue to follow while inpatient.  Thank you, Dulce Sellar, RN, BSN Diabetes Coordinator Inpatient Diabetes Program [phone] (team pager from 8a-5p)

## 2020-10-30 DEATH — deceased

## 2020-11-05 ENCOUNTER — Encounter (HOSPITAL_COMMUNITY): Payer: Self-pay | Admitting: General Surgery

## 2020-11-05 LAB — SURGICAL PATHOLOGY

## 2022-12-03 IMAGING — CT CT ANGIO CHEST
2 of 6 series · 17 of 46 positions shown · IV contrast (omnipaque)
Comparison: cxr[REDACTED]

CLINICAL DATA: Wheezing and shortness of breath. Concern for
pneumonia. Concern for pulmonary embolus.

EXAM:
CT ANGIOGRAPHY CHEST WITH CONTRAST
TECHNIQUE: Multidetector CT imaging of the chest was performed using the
standard protocol during bolus administration of intravenous
contrast. Multiplanar CT image reconstructions and MIPs were
obtained to evaluate the vascular anatomy.
CONTRAST:  60mL OMNIPAQUE IOHEXOL 350 MG/ML SOLN

[Series 6: thins · axial · 0.77mm/px · z∈[-272,-25]mm · 14 of 271 slices shown]
[im 12/271  lung]
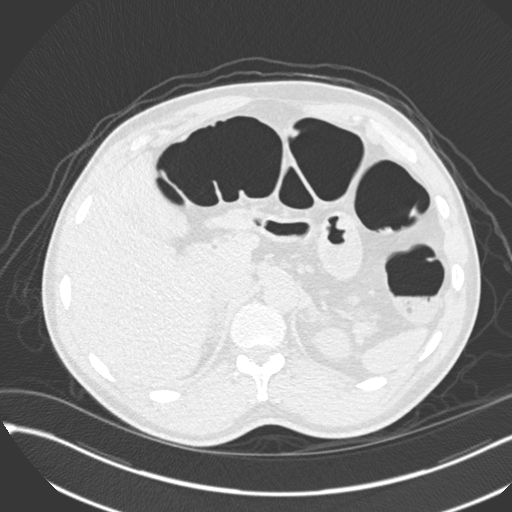
[im 36/271  soft-tissue]
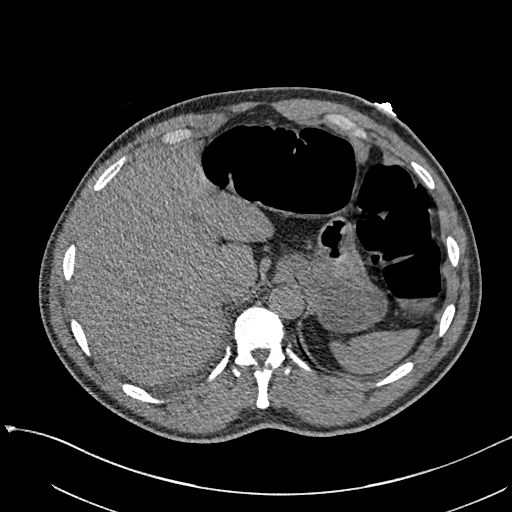
[im 47/271  lung]
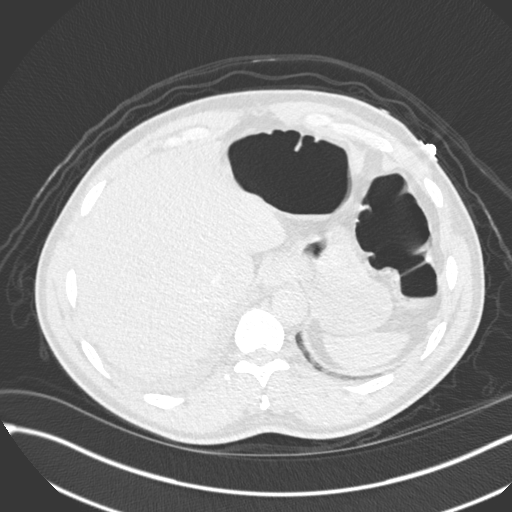
[im 71/271  soft-tissue]
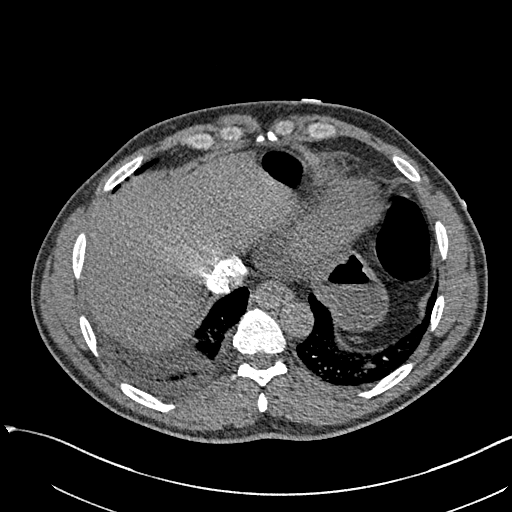
[im 94/271  lung]
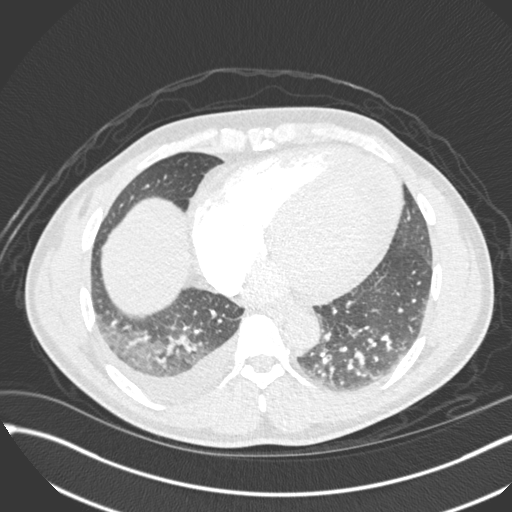
[im 106/271  soft-tissue]
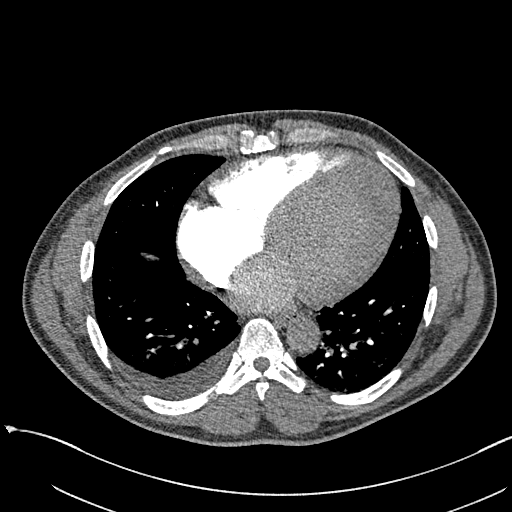
[im 130/271  lung]
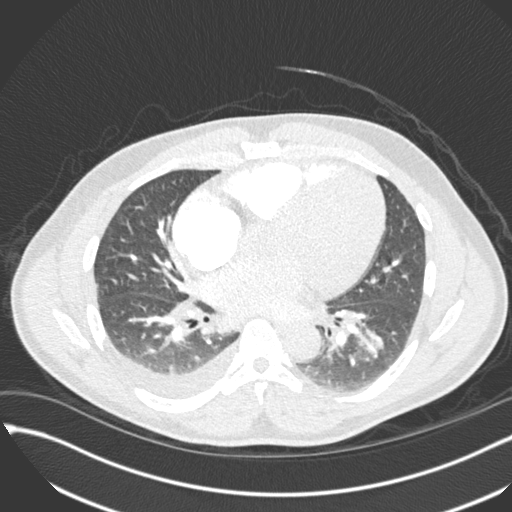
[im 141/271  soft-tissue]
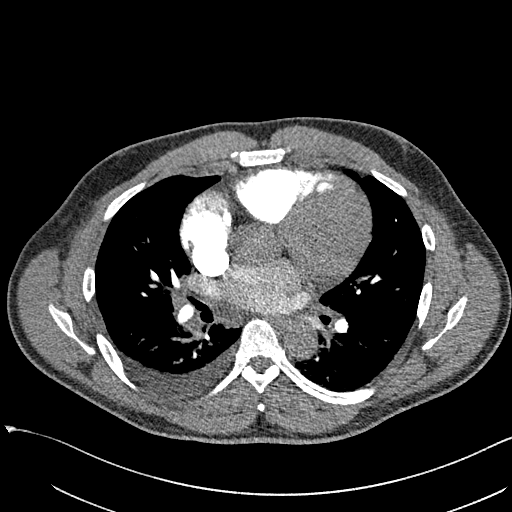
[im 165/271  lung]
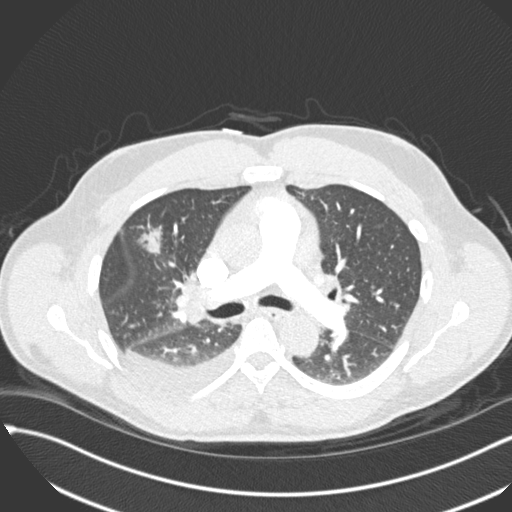
[im 177/271  soft-tissue]
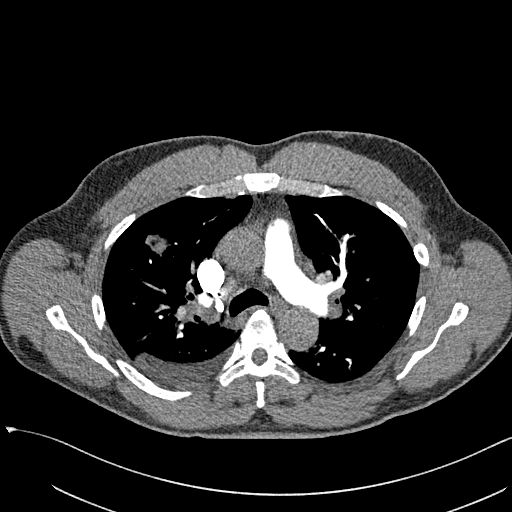
[im 200/271  lung]
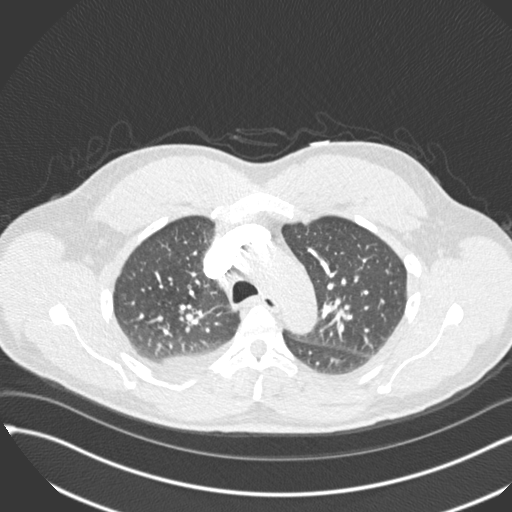
[im 224/271  soft-tissue]
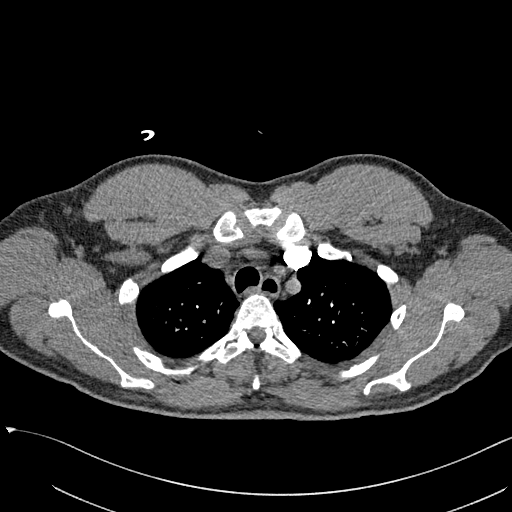
[im 235/271  lung]
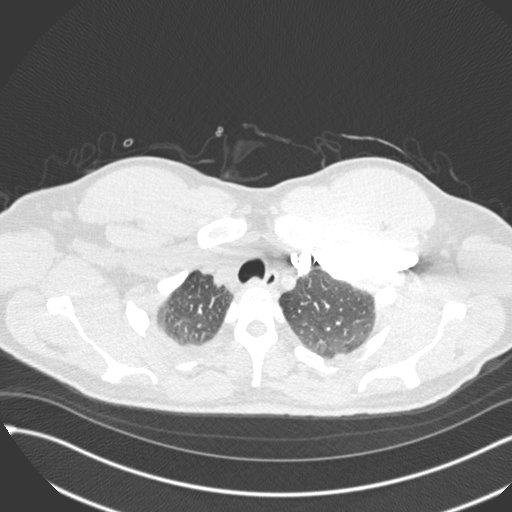
[im 259/271  soft-tissue]
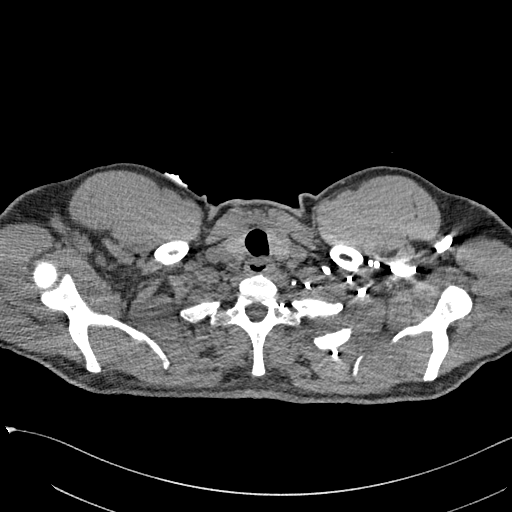

[Series 8: coronal mpr · coronal · 0.54mm/px · 3 of 145 slices shown]
[im 37/145  soft-tissue]
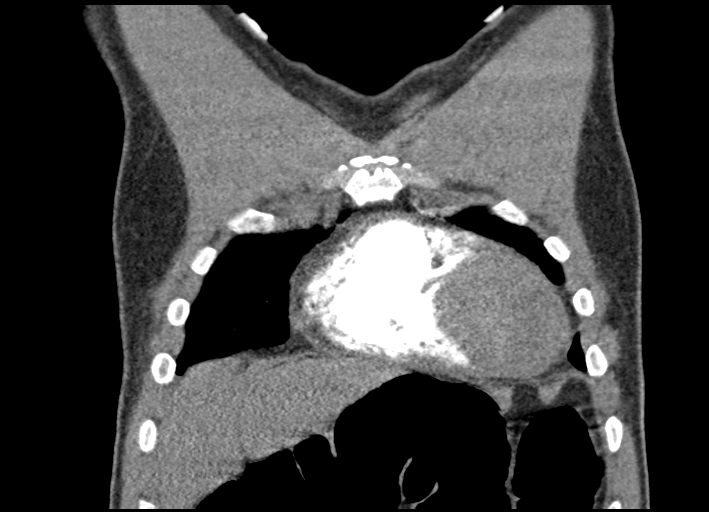
[im 73/145  soft-tissue]
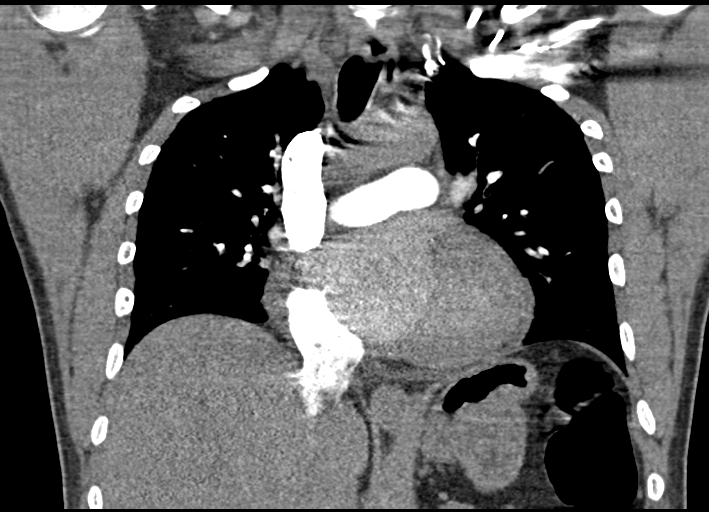
[im 109/145  soft-tissue]
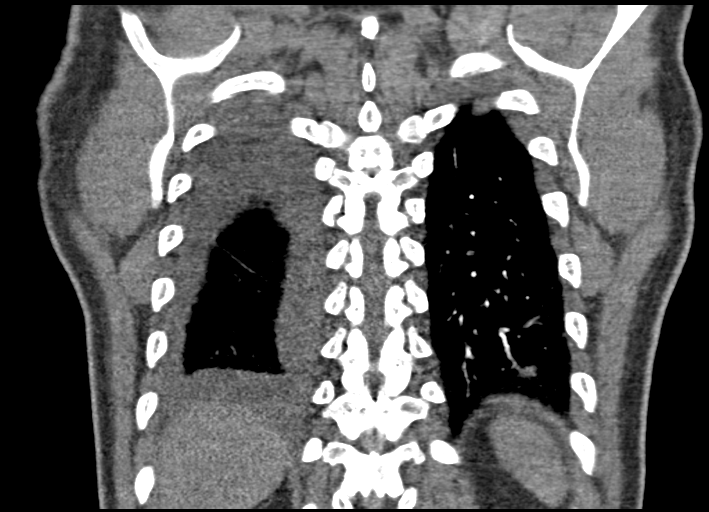

[17 of 46 positions shown; findings below may reference images not displayed]

FINDINGS: Cardiovascular: Satisfactory opacification of the pulmonary arteries
to the segmental level. No evidence of pulmonary embolism. Enlarged
left ventricle. No pericardial effusion.

Mediastinum/Nodes: Bilateral hilar lymphadenopathy. No enlarged
mediastinal or axillary lymph nodes. Thyroid gland, trachea, and
esophagus demonstrate no significant findings.

Lungs/Pleura: Interval development of peribronchovascular and
peripheral patchy consolidations. Interval development of a 9 mm
nodular like density at the left base. No cavitation. Trace to small
volume right pleural effusion. No pneumothorax.

Upper Abdomen: No acute abnormality.

Musculoskeletal: No chest wall abnormality. No acute or significant
osseous findings.

Review of the MIP images confirms the above findings.
IMPRESSION: 1. No pulmonary embolus.
2. Findings suggestive of multifocal pneumonia with a trace to small
volume right pleural effusion.
3. Associated interval development of a 9 mm nodular-like density at
the left base. Non-contrast chest CT at 3-6 months is recommended.
If nodules persist, subsequent management will be based upon the
most suspicious nodule(s). This recommendation follows the consensus
statement: Guidelines for Management of Incidental Pulmonary Nodules
Detected on CT Images: From the [HOSPITAL] 8787; Radiology
8787; [DATE].
4. Associated bilateral hilar lymphadenopathy that is likely
reactive in etiology.
5. Enlarged left ventricle.

## 2022-12-03 IMAGING — DX DG CHEST 2V
2 series · 2 of 2 positions shown · non-contrast
Comparison: November 11, 2013.

CLINICAL DATA: Wheezing, shortness of breath.

EXAM:
CHEST - 2 VIEW

[chest pa]
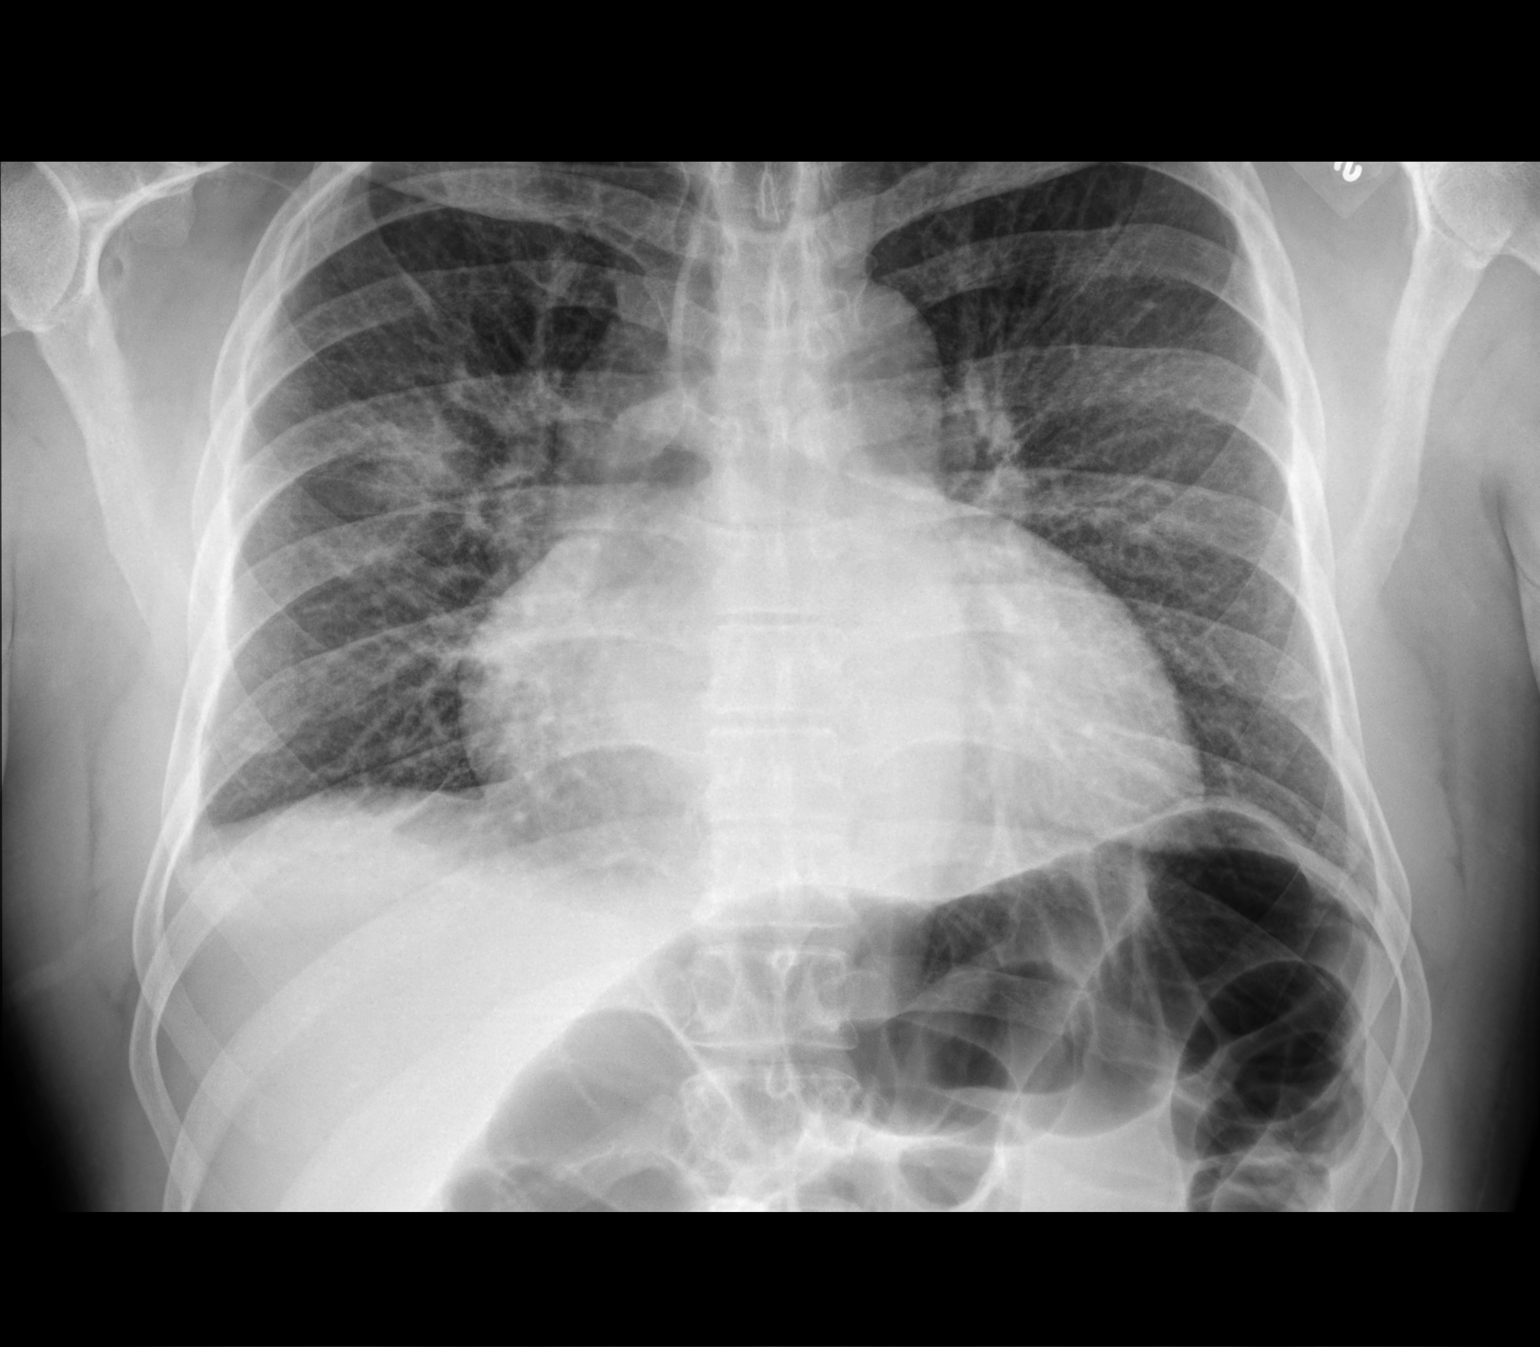

[chest lat]
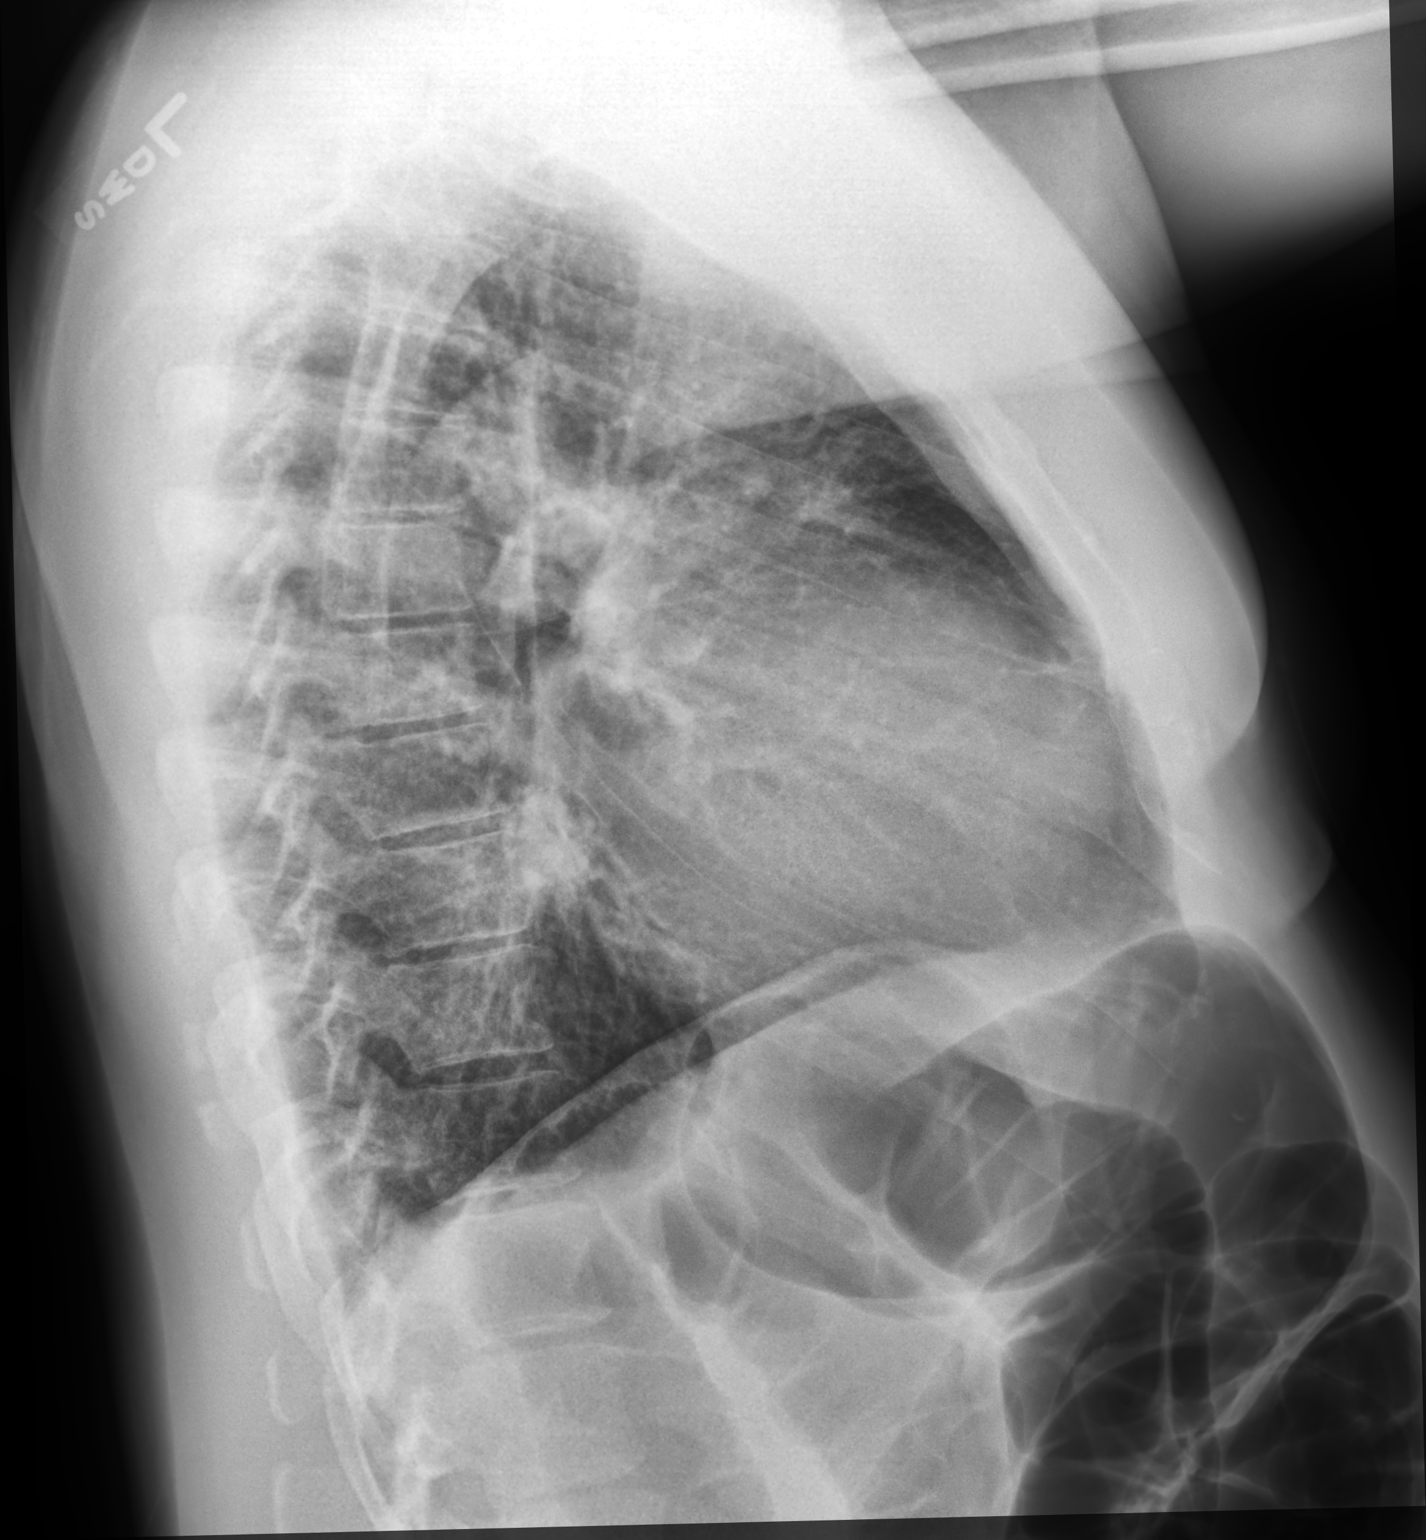

[2 of 2 positions shown; findings below may reference images not displayed]

FINDINGS: The heart size and mediastinal contours are within normal limits. No
pneumothorax pleural effusion is noted. Left lung is clear. New
right perihilar opacity is noted which may represent pneumonia, but
neoplasm cannot be excluded. The visualized skeletal structures are
unremarkable.
IMPRESSION: New right perihilar opacity is noted which may represent pneumonia,
but neoplasm cannot be excluded. CT scan of the chest is recommended
for further evaluation.
